# Patient Record
Sex: Female | Born: 1960 | Race: Black or African American | Hispanic: No | Marital: Single | Smoking: Never smoker
Health system: Southern US, Community
[De-identification: ages and names within clinical notes are randomized; demographics above are authoritative.]

## PROBLEM LIST (undated history)

## (undated) DIAGNOSIS — T7840XA Allergy, unspecified, initial encounter: Secondary | ICD-10-CM

## (undated) DIAGNOSIS — F32A Depression, unspecified: Secondary | ICD-10-CM

## (undated) DIAGNOSIS — I10 Essential (primary) hypertension: Secondary | ICD-10-CM

## (undated) DIAGNOSIS — F419 Anxiety disorder, unspecified: Secondary | ICD-10-CM

## (undated) DIAGNOSIS — E119 Type 2 diabetes mellitus without complications: Secondary | ICD-10-CM

## (undated) DIAGNOSIS — E785 Hyperlipidemia, unspecified: Secondary | ICD-10-CM

## (undated) DIAGNOSIS — M1990 Unspecified osteoarthritis, unspecified site: Secondary | ICD-10-CM

## (undated) DIAGNOSIS — F329 Major depressive disorder, single episode, unspecified: Secondary | ICD-10-CM

## (undated) HISTORY — PX: CHOLECYSTECTOMY: SHX55

## (undated) HISTORY — DX: Type 2 diabetes mellitus without complications: E11.9

## (undated) HISTORY — DX: Hyperlipidemia, unspecified: E78.5

## (undated) HISTORY — PX: OTHER SURGICAL HISTORY: SHX169

## (undated) HISTORY — DX: Anxiety disorder, unspecified: F41.9

## (undated) HISTORY — DX: Depression, unspecified: F32.A

## (undated) HISTORY — DX: Essential (primary) hypertension: I10

## (undated) HISTORY — DX: Allergy, unspecified, initial encounter: T78.40XA

## (undated) HISTORY — DX: Unspecified osteoarthritis, unspecified site: M19.90

## (undated) HISTORY — DX: Morbid (severe) obesity due to excess calories: E66.01

## (undated) HISTORY — DX: Major depressive disorder, single episode, unspecified: F32.9

---

## 2000-07-26 ENCOUNTER — Encounter: Admission: RE | Admit: 2000-07-26 | Discharge: 2000-07-26 | Payer: Self-pay | Admitting: Family Medicine

## 2000-07-26 ENCOUNTER — Encounter: Payer: Self-pay | Admitting: Family Medicine

## 2001-10-18 ENCOUNTER — Encounter: Payer: Self-pay | Admitting: Internal Medicine

## 2001-10-18 ENCOUNTER — Ambulatory Visit (HOSPITAL_COMMUNITY): Admission: RE | Admit: 2001-10-18 | Discharge: 2001-10-18 | Payer: Self-pay | Admitting: Internal Medicine

## 2001-10-22 ENCOUNTER — Ambulatory Visit (HOSPITAL_COMMUNITY): Admission: RE | Admit: 2001-10-22 | Discharge: 2001-10-22 | Payer: Self-pay | Admitting: Family Medicine

## 2009-05-20 ENCOUNTER — Ambulatory Visit: Payer: Self-pay | Admitting: Family Medicine

## 2009-05-20 ENCOUNTER — Encounter: Payer: Self-pay | Admitting: Family Medicine

## 2009-05-20 DIAGNOSIS — I10 Essential (primary) hypertension: Secondary | ICD-10-CM | POA: Insufficient documentation

## 2009-05-20 DIAGNOSIS — K5909 Other constipation: Secondary | ICD-10-CM | POA: Insufficient documentation

## 2009-05-20 DIAGNOSIS — J309 Allergic rhinitis, unspecified: Secondary | ICD-10-CM | POA: Insufficient documentation

## 2009-05-20 DIAGNOSIS — F341 Dysthymic disorder: Secondary | ICD-10-CM | POA: Insufficient documentation

## 2009-05-20 DIAGNOSIS — M170 Bilateral primary osteoarthritis of knee: Secondary | ICD-10-CM

## 2009-05-20 LAB — CONVERTED CEMR LAB
ALT: 10 units/L (ref 0–35)
AST: 14 units/L (ref 0–37)
Albumin: 4.1 g/dL (ref 3.5–5.2)
Alkaline Phosphatase: 107 units/L (ref 39–117)
BUN: 11 mg/dL (ref 6–23)
CO2: 24 meq/L (ref 19–32)
Calcium: 9.9 mg/dL (ref 8.4–10.5)
Chloride: 103 meq/L (ref 96–112)
Cholesterol: 177 mg/dL (ref 0–200)
Creatinine, Ser: 0.94 mg/dL (ref 0.40–1.20)
Glucose, Bld: 90 mg/dL (ref 70–99)
HDL: 58 mg/dL (ref >39)
LDL Cholesterol: 107 mg/dL — ABNORMAL HIGH (ref 0–99)
Potassium: 3.9 meq/L (ref 3.5–5.3)
Sodium: 137 meq/L (ref 135–145)
Total Bilirubin: 0.5 mg/dL (ref 0.3–1.2)
Total CHOL/HDL Ratio: 3.1
Total Protein: 8.7 g/dL — ABNORMAL HIGH (ref 6.0–8.3)
Triglycerides: 59 mg/dL (ref <150)
VLDL: 12 mg/dL (ref 0–40)

## 2009-05-24 ENCOUNTER — Ambulatory Visit: Payer: Self-pay | Admitting: Family Medicine

## 2009-06-25 ENCOUNTER — Ambulatory Visit: Payer: Self-pay | Admitting: Family Medicine

## 2009-06-25 ENCOUNTER — Encounter: Payer: Self-pay | Admitting: Family Medicine

## 2009-06-25 LAB — CONVERTED CEMR LAB: Pap Smear: NEGATIVE

## 2009-06-29 ENCOUNTER — Telehealth: Payer: Self-pay | Admitting: Psychology

## 2009-07-06 ENCOUNTER — Encounter: Payer: Self-pay | Admitting: Family Medicine

## 2009-07-06 LAB — CONVERTED CEMR LAB
BUN: 15 mg/dL (ref 6–23)
CO2: 24 meq/L (ref 19–32)
Calcium: 9.3 mg/dL (ref 8.4–10.5)
Chloride: 99 meq/L (ref 96–112)
Creatinine, Ser: 1.05 mg/dL (ref 0.40–1.20)
Glucose, Bld: 96 mg/dL (ref 70–99)
Potassium: 3.3 meq/L — ABNORMAL LOW (ref 3.5–5.3)
Sodium: 137 meq/L (ref 135–145)

## 2009-07-15 ENCOUNTER — Ambulatory Visit: Payer: Self-pay | Admitting: Psychology

## 2009-07-15 DIAGNOSIS — F39 Unspecified mood [affective] disorder: Secondary | ICD-10-CM | POA: Insufficient documentation

## 2009-08-02 ENCOUNTER — Ambulatory Visit: Payer: Self-pay | Admitting: Psychology

## 2009-08-19 ENCOUNTER — Ambulatory Visit: Payer: Self-pay | Admitting: Psychology

## 2009-09-09 ENCOUNTER — Ambulatory Visit: Payer: Self-pay | Admitting: Psychology

## 2009-10-04 ENCOUNTER — Ambulatory Visit: Payer: Self-pay | Admitting: Family Medicine

## 2009-10-04 ENCOUNTER — Encounter: Payer: Self-pay | Admitting: Family Medicine

## 2009-10-04 ENCOUNTER — Ambulatory Visit: Payer: Self-pay | Admitting: Psychology

## 2009-10-04 DIAGNOSIS — E876 Hypokalemia: Secondary | ICD-10-CM

## 2009-10-04 LAB — CONVERTED CEMR LAB
BUN: 10 mg/dL (ref 6–23)
CO2: 23 meq/L (ref 19–32)
Calcium: 9.4 mg/dL (ref 8.4–10.5)
Chloride: 101 meq/L (ref 96–112)
Creatinine, Ser: 0.93 mg/dL (ref 0.40–1.20)
Glucose, Bld: 104 mg/dL — ABNORMAL HIGH (ref 70–99)
Potassium: 3.8 meq/L (ref 3.5–5.3)
Sodium: 138 meq/L (ref 135–145)

## 2010-03-11 ENCOUNTER — Telehealth: Payer: Self-pay | Admitting: Family Medicine

## 2010-03-31 ENCOUNTER — Telehealth (INDEPENDENT_AMBULATORY_CARE_PROVIDER_SITE_OTHER): Payer: Self-pay | Admitting: *Deleted

## 2010-03-31 ENCOUNTER — Encounter: Payer: Self-pay | Admitting: Family Medicine

## 2010-03-31 ENCOUNTER — Ambulatory Visit: Payer: Self-pay | Admitting: Family Medicine

## 2010-03-31 DIAGNOSIS — R443 Hallucinations, unspecified: Secondary | ICD-10-CM

## 2010-04-11 ENCOUNTER — Ambulatory Visit: Payer: Self-pay | Admitting: Family Medicine

## 2010-04-12 ENCOUNTER — Telehealth: Payer: Self-pay | Admitting: Psychology

## 2010-04-22 ENCOUNTER — Ambulatory Visit (HOSPITAL_COMMUNITY)
Admission: RE | Admit: 2010-04-22 | Discharge: 2010-04-22 | Payer: Self-pay | Source: Home / Self Care | Attending: Family Medicine | Admitting: Family Medicine

## 2010-04-28 ENCOUNTER — Ambulatory Visit: Admit: 2010-04-28 | Payer: Self-pay | Admitting: Psychology

## 2010-05-15 ENCOUNTER — Encounter: Payer: Self-pay | Admitting: *Deleted

## 2010-05-26 NOTE — Assessment & Plan Note (Signed)
Summary: F/U  KH   Vital Signs:  Patient profile:   50 year old female Height:      63 inches Weight:      350 pounds BMI:     62.22 Temp:     98.3 degrees F oral Pulse rate:   104 / minute BP sitting:   152 / 94  (left arm) Cuff size:   thigh  Vitals Entered By: Garen Grams LPN (April 11, 2010 1:57 PM) CC: f/u anxiety, bp Is Patient Diabetic? No Pain Assessment Patient in pain? no        Primary Care Provider:  Hulen Luster  CC:  f/u anxiety and bp.  History of Present Illness: pt returns for for f/u of anxiety and hearing voices.  she thinks her anxiety is returning to its normal levels after stopping the HCTZ.  she still hears voices that say "you're going to die".  they do not ask her to hurt herself or others.  she does not want to take pills and she wants to be treated with something natural.  she brought up Kava and passionfruit as possibilities.  She does not want to take any medicaiton for her BP at this time because she is afraid it will her anxiety.  Pt has had lifelong anxiety but has not heard voices before.    Habits & Providers  Alcohol-Tobacco-Diet     Alcohol drinks/day: 0     Tobacco Status: never     Diet Comments: high in fat and salt   Current Medications (verified): 1)  Zyrtec Hives Relief 10 Mg Tabs (Cetirizine Hcl) 2)  Excedrin Tension Headache 500-65 Mg Tabs (Acetaminophen-Caffeine)  Allergies (verified): No Known Drug Allergies  Past History:  Social History: Last updated: 05/20/2009 lives with mother Jamie Salazar.  Does not endorse a healthy diet.  completed 9-12 years of education.  never married, heterosexual, has a car, no alcohol, no drugs, no exercise.    Review of Systems  The patient denies anorexia, weight loss, syncope, and abdominal pain.    Physical Exam  General:  NAD, vs reviewed and rechecked Head:  normocephalic and atraumatic.   Eyes:  vision grossly intact.   Ears:  R ear normal and L ear normal.   Lungs:  Normal  respiratory effort, chest expands symmetrically. Lungs are clear to auscultation, no crackles or wheezes. Heart:  Normal rate and regular rhythm. S1 and S2 normal without gallop, murmur, click, rub or other extra sounds. Abdomen:  morbidly obese, nontender Psych:  Oriented X3, memory intact for recent and remote, normally interactive, good eye contact, not anxious appearing, and not depressed appearing.     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  discussed with Dr. Pascal Lux.  She suggests that pt follow up with her and Dr. Kathrynn Running.  Pt has been given number to call.  these voices seem benign but pt given RTC to call and return. Orders: [facility]- Est  Level 4 ([id number])  Problem # 2:  ESSENTIAL HYPERTENSION (ICD-401.9) high but will not give any new meds until anxiety improved.  pt likely to emphasize any side effect at this point.  she also is afraid to swallow any pills Orders: [facility]- Est  Level 4 ([id number])  Problem # 3:  DEPRESSION/ANXIETY (ICD-300.4) Assessment: Unchanged pt wants to treat with a natural remedy.  advised her not to use Kava.  looked up passionflower which is likely safe according to  medline review.  pt to RTC after her meeting with Dr. Pascal Lux. Orders: [facility]- Est  Level 4 ([id number])  Complete Medication List: 1)  Zyrtec Hives Relief 10 Mg Tabs (Cetirizine hcl) 2)  Excedrin Tension Headache 500-65 Mg Tabs (Acetaminophen-caffeine)  Patient Instructions: 1)  Please do not take Kava 2)  If you would like to start a depression/anxiety medication, please come and talk to me or Dr. Pascal Lux.   3)  Call or return if your voices start to seem threatening or are telling you to hurt yourself or others. 4)  Kava is POSSIBLY UNSAFE when taken by mouth. Don't use it. Serious illness, including liver damage, has occurred even with short-term use of normal doses. The use of kava for as little as one to three  months has resulted in the need for liver transplants, and even death. Early symptoms of liver damage include yellowed eyes and skin (jaundice), fatigue, and dark urine. If you decide to take kava, despite warnings to the contrary, be sure to get frequent liver function tests. 5)  Using kava can make you unable to drive or operate machinery safely. Do not take kava before you plan on driving. "Driving-under-the-influence" citations have been issued to people driving erratically after drinking large amounts of kava tea.    Orders Added: 1)  [facility]- Est  Level 4 [[id number]]

## 2010-05-26 NOTE — Assessment & Plan Note (Signed)
Summary: Initial behavioral Medicine   Primary Care Provider:  Hulen Luster   History of Present Illness: "Jamie Salazar" presented for an initial psychological assessment.  A client information sheet detailing the Behavioral Medicine Service was provided.  The patient voiced an understanding of what was detailed on this sheet including the issue of confidentiality and the limits thereof.  Presenting problem: Jamie Salazar reported that she is depressed and has been the "worrying type" ever since childhood.  Her main symptoms is tearfullness at unexpected times and she reports feeling mentally tired.  She says the weather affects her mood significantly such that she is energetic and motivated when it is sunny and when it is cloudy she wants to sleep the day away.  She thinks her depression stems from the sacrifices she made as the oldest child of a single mom who was sick with Lupus.  She focused on taking care of everyone else and let herself go int he process.  She would like to focus on herself now.  Relevant medical history: Morbid obesity.  Weight at initial presentation was 360.  This proved encouraging to her because she thought she was heavier.  She lost 15 pounds and has an eventual goal of weighing 200 pounds.  She has a good sense of how to get there (portion control, regular meals, eating for the right reasons, not drinking her calories and eventually - exercise).  She has arthritis which limits her ability to exercise.  She recognizes the connection between her blood pressure and weight.  See med list.  She is not taking the Celexa because she cannot afford the $4 per month.  She also states that she is not a "pill person."  Psych history: Was seen for therapy at [facility] for about four months with termination in February, 2011 due to feelign stuck.  Denied other treatment other than an [facility] physician prescribing Zoloft a while back.  She never took it.   Family history: Oldest of four children.   Siblings have different paternity.  Lives with mother in mother's house.  Mom remains very ill with Lupus.  Father is deceased.  Did not have a close relationship with him.  Brother died of lupus.  She has two sisters and she is close to them.  The youngest has lost nearly 250 pounds without surgical intervention.  History of abuse: Did not assess.  Education / Occupation: 11th grade.  Last full-time work was 2001.  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  Can not stand for eight hours like she used to.  Not on disability.    Substances use:  Denies tobacco, alcohol and other drugs.    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  She maintains good eye contact and iscooperative and attentive.  Speech is normal in tone, rate and rhythm.  Mood is reported as depressed and anxious but variable dependent on the weather.  Affect varies.  She is tearful on occasion but able to smile and laugh.  Brightens considerably when talking about her weight loss.  Thought process is logical and goal directed.  Denied suicidal or homicidal ideation.  Does not appear to be responding to any internal stimuli.  Judgment and insight are average.  Reluctant to label this Major Depression.  She goes a night without sleeping on a regular basis and reports feeling restless.  She reports shifts  in energy and mood.  She feels driven at times to do certain activities (cook, shop).  Reports some anhedonia but clearly experiences pleasure with sunnier weather.  Enjoys going for rides in the car.  Finances limit her ability to do what she wants but there is a decreased interest in previously pleasureable activities.  She is able to report things that might help her mood including exercise, eating well, thinking about others.  I added keeping a regular sleep schedule.    PHQ-9 score  was 9 with function indicated as "not difficult at all."  The symptoms she endorsed nearly every day was difficulty with concentration.  MDQ was 3 positive responses including less sleep, talking faster and racing thoughts.  Again she indicated that these symptoms were not a problem.  Her report on these measures indicates a lack of functional impairment.  Will clarify this next visit when I give her feedback on these measures.  Asked her to consider an endpoint - how will she know therapy is working?  What will be different.    Follow-up on:  April 11th at 9:00.  [phone]  Complete Medication List: 1)  Hydrochlorothiazide 25 Mg Tabs (Hydrochlorothiazide) .... Take one daily 2)  Celexa 20 Mg Tabs (Citalopram hydrobromide) .... Take one daily 3)  Klor-con 10 10 Meq Cr-tabs (Potassium chloride) .... Take 2 tabs daily 4)  Metronidazole 500 Mg Tabs (Metronidazole) .... Take 4 tabs one time by mouth

## 2010-05-26 NOTE — Progress Notes (Signed)
Summary: Schedule beh med  Phone Note Call from Patient   Caller: Patient Call For: Spero Geralds Summary of Call: Patient seemed ambivalent about coming in to see me.  She thinks she needs a medication but then says she has always been concerned about taking a medication.  Offered an appt to explore ambivalence. She agreed.  Will meet January 5th at 11:00. Initial call taken by: Spero Geralds PsyD,  April 12, 2010 10:53 AM

## 2010-05-26 NOTE — Assessment & Plan Note (Signed)
Summary: Behavioral Medicine Follow-up   Primary Care Provider:  Hulen Luster   History of Present Illness: Continued to process the loss of her sister.  While receiving visitors, Alinda Money noticed the discomfort some of them were experiencing (around the grief) and worked hard to make them feel more comfortable.  We talked about this in the context of her upbringing and how she has always been the caretaker.  See assessment for more details.  Her relationship with her mother is challenging.  Alinda Money is ready to forgive her but her mother is currently not in a place to connect.  She has been in the past but at the time, Alinda Money wasn't ready.  She does not believe that her mother loves her (or anyone else for that matter) and needs to learn how to love.  Alinda Money acknowledged again today that she has learned to love herself.  Discussed boundaries when it comes to feelings (i.e. who is ultimately responsible for how one feels).  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  Affect is variable and consistent with her speech content.  She cries when discussing the relationship with her mother.  Thoughts are clear and goal directed.  Insight is average to above.  No evidence of SI.  She is working hard in therapy.  Patient continues to report improvement in symptoms and behaviors.  We explored her caretaking around her sister's funeral and determined it to be a healthy blend between taking care of herself and being aware of and sensitive to other people's feelings.  She said previousley, she would have ignored her own experience in service of others.  Progress (as she says).  With regards to her mother, she seems very clear headed about this as well.  There were some psychoeducational points today but for the most part, she is approaching things in what seems like a healthy direction.  She does not try  to caretake me.  Having 50 minutes where the focus is on her, in service of her, is likely to be therapeutic in and of itself.  Will follow near the end of May.  Orders: Therapy 40-50- min- [facility] ([id number])  Problem # 2:  MORBID OBESITY (ICD-278.01)  Patient continues to make healthy choices.  Discussed nutritional counseling - she had meant to follow-up with Dr. Gerilyn Pilgrim and forgot.  She will weigh herself on the way out and is excited about the result.  Discussed not letting a number on a scale determine how she feels.  If it is not as much of a loss as she expects / hopes, the efforts are still helpful in that they are healthier AND she feels healthier when working on her weight in this way.  She did not want me to see her weigh in but she did check back afterward...disappointed.  Reviewed "don't let a number on a scale determine how you will feel today."  Orders: Therapy 40-50- min- [facility] ([id number])  Complete Medication List: 1)  Hydrochlorothiazide 25 Mg Tabs (Hydrochlorothiazide) .... Take one daily 2)  Celexa 20 Mg Tabs (Citalopram hydrobromide) .... Take one daily 3)  Klor-con 10 10 Meq Cr-tabs (Potassium chloride) .... Take 2 tabs daily 4)  Metronidazole 500 Mg Tabs (Metronidazole) .... Take 4 tabs one time by mouth

## 2010-05-26 NOTE — Assessment & Plan Note (Signed)
Summary: Seeing Jamie Salazar @ 9:30 / [facility]   Vital Signs:  Patient profile:   50 year old female Height:      63 inches Weight:      343.6 pounds BMI:     61.09  Vitals Entered By: Wyona Almas PHD (October 04, 2009 9:41 AM)   Primary Care Provider:  Hulen Luster   History of Present Illness: Assessment:  Saw pt for 60 minutes.  Eating pattern is erratic; seldom eats breakfast, and she tends to snack.  Jamie Salazar fav fds include [place] foods, starches.  Foods Jamie Salazar does not like include Brussel sprouts, artichoke, &  broccoli.  Has never tried non-fried fish.  Physical activity is nonexistent, but she said she is going to start walking 3-5 X wk per Dr. Hulen Luster.  24-hr recall suggests kcal intake of  ~1300: B- none ; L (12:30 PM)- Captain D's 4 hush puppies, 1/2 c slaw, 1 c [place] fries, 3 pc fried fish, 16 oz water; Snk (4:30 PM)- 1 c banana pudding.  Nutrition Diagnosis:  Physical inactivity (NB-2.1) related to inertia as evidenced by no reported exercise of any kind.  Inappropriate intake of types of carbohydrate (NI-53.3) related to fiber as evidenced by no fruit, and <1 serving of veg's yesterday.  Poor distribution of kcal as evidenced by only one meal and one snack yesterday.   Intervention: See Patient Instructions.    Monitoring/Eval:  Dietary intake, body weight, and exercise at 4-wk F/U.     Allergies: No Known Drug Allergies   Complete Medication List: 1)  Hydrochlorothiazide 25 Mg Tabs (Hydrochlorothiazide) .... Take one daily 2)  Celexa 20 Mg Tabs (Citalopram hydrobromide) .... Take one daily 3)  Potassium Chloride 20 Meq/28ml (10%) Liqd (Potassium chloride) .... Take each day (3 teaspoons), dispense with one month supply 4)  Metronidazole 500 Mg Tabs (Metronidazole) .... Take 4 tabs one time by mouth  Other Orders: Inital Assessment Each - [facility] [phone])  Patient Instructions: 1)  Walk 3-5 X wk, starting your first week with ONLY 15 min.  In later weeks, increase  no more than 3 minutes per time.   2)  Increase water intake.  Fluids goal:  at least 8 8-oz cups.   3)  Eat at least 3 meals and 1-2 snacks per day.  4)  Breakfast options:  oatmeal, bran cereals,  5)  Aim for a consistent bedtime.  Remember that adequate sleep is essential to good health, and weight management.   6)  Aim for at 5 servings of fruits and veg's.   7)  Obtain twice as many veg's as protein or carbohydrate foods for both lunch and dinner. 8)  Dried beans can count as both protein and starch.   9)  TASTE PREFERENCES ARE LEARNED.   10)  Schedule a follow-up nutrition appt for 4 weeks.

## 2010-05-26 NOTE — Progress Notes (Signed)
Summary: phn msg  Phone Note Call from Patient Call back at [phone] or [phone]   Caller: Patient Summary of Call: she thinks her bp pills is causing anxiety and hearing voices/mind is racing - would like to change meds asap- will not take bp pill today [facility] [address]  wants to know if it's alright to go health food store to get something to help with her anxiety Initial call taken by: De Nurse,  March 31, 2010 8:39 AM    spoke with patient and she has been on HCTZ for several months. just last week she started with anxiety and hearing voices. she read pamphlet with medication and anxiety is a side effect she states. she has not taken medication today and does not want to take it anymore. appointment scheduled today to discuss with MD. Theresia Lo RN  March 31, 2010 10:58 AM

## 2010-05-26 NOTE — Assessment & Plan Note (Signed)
Summary: anxiety, hearing voices, thinks BP med causing/ls   Vital Signs:  Patient profile:   50 year old female Height:      63 inches Weight:      340.5 pounds BMI:     60.53 Temp:     98.8 degrees F oral Pulse rate:   108 / minute BP sitting:   131 / 88  (left arm) Cuff size:   large  Vitals Entered By: Garen Grams LPN (March 31, 2010 3:36 PM) CC: anxiety, hearing voices x 3 weeks Is Patient Diabetic? No Pain Assessment Patient in pain? no        Primary Care Arynn Armand:  Hulen Luster  CC:  anxiety and hearing voices x 3 weeks.  History of Present Illness: 1. Anxiety and Hearing Voices:  Pt with a long standing history of anxiety.  She has frequent anxiety and panic attacks ever since she was a little girl.  She was recently started on HCTZ for her blood pressure and she thinks that it could be causing her anxiety to be worse.  She is afraid to take medicines and is afraid of death.  Since taking the medicine her anxiety has been worse.  She has also heard voices.  She has never heard voices before and this is concerning to her.  The voice is telling her that "you are going to die."  She stopped taking the HCTZ today and she hasn't heard any voices today.  The voices are not telling her to kill herself.  She has no thoughts about suicide.    Habits & Providers  Alcohol-Tobacco-Diet     Alcohol drinks/day: 0     Tobacco Status: never     Diet Comments: high in fat and salt  Current Medications (verified): 1)  None  Allergies: No Known Drug Allergies  Past History:  Past Medical History: Anxiety Mood disorder  Family History: Reviewed history from 05/20/2009 and no changes required. hx of lupus and diabetes in mother and brother.  DM in grandmother.  Social History: Reviewed history from 05/20/2009 and no changes required. lives with mother Elana Jian.  Does not endorse a healthy diet.  completed 9-12 years of education.  never married, heterosexual, has a car, no  alcohol, no drugs, no exercise.    Review of Systems  The patient denies fever, weight loss, vision loss, decreased hearing, hoarseness, chest pain, syncope, dyspnea on exertion, peripheral edema, prolonged cough, and headaches.    Physical Exam  General:  vitals reviewed. obese, tearful at times.  no acute distress.  not manic.  coherent.  AAOX3.   Head:  normocephalic and atraumatic.   Eyes:  vision grossly intact.  PERRL.  EOMI Lungs:  Normal respiratory effort, chest expands symmetrically. Lungs are clear to auscultation, no crackles or wheezes. Heart:  distant heart sounds.  Regular rate. Neurologic:  Intact Psych:  Emotionally labile.  Tearful at times.  Some tangential thinking.  Not acutely manic or pyschotic.  Good eye contact.    not agitated, not suicidal, and judgment fair.     Impression & Recommendations:  Problem # 1:  DEPRESSION/ANXIETY (ICD-300.4) Assessment Deteriorated  Likely related to her fear of medications and having to take prescription medications for her blood pressure.  She didn't want to start anything new for her anxiety.  She would benefit from close follow up.  Would also arrange for her to follow up with Dr. Kathrynn Running and Dr. Pascal Lux.  Discussed case with Dr. Pascal Lux and she  agrees with the plan.  Next available appointment is January 18th.  Will defer to PCP.  Orders: [facility]- Est  Level 4 ([id number])  Problem # 2:  AUDITORY HALLUCINATION (ICD-780.1) Assessment: New  Likely also related to her anxiety.  She is not acutely psychotic.  She denies and SI and demanding voices.  She is safe which is my main concern as a work in.  This doesn't have to be acutely managed and she is very hesitant about starting a new medicine.  Will have her follow up closely with PCP.    Orders: [facility]- Est  Level 4 ([id number])  Problem # 3:  ESSENTIAL HYPERTENSION (ICD-401.9) Assessment: Unchanged  BP acceptable without any medications.  Will stop the HCTZ.  Would be hesitant to restart a  medicine on her since she is so afraid of them. The following medications were removed from the medication list:    Hydrochlorothiazide 25 Mg Tabs (Hydrochlorothiazide) .Marland Kitchen... Take one daily  Orders: [facility]- Est  Level 4 ([id number])  Patient Instructions: 1)  I think that all of your symptoms could be related to your anxiety 2)  I think that the medicine that you were prescribed and your fear of medicine could be playing into that 3)  I would like for you to stop the HCTZ and see how you do 4)  If you start having any thoughts of wanting to hurt / kill yourself, if the voices become more bothersome, if your anxiety gets worse then call the office or call 911 5)  Please schedule a follow up appointment with Dr. Hulen Luster next week and she will follow you until we can get you into the Mental Health Specialist   Orders Added: 1)  [facility]- Est  Level 4 [[id number]]

## 2010-05-26 NOTE — Assessment & Plan Note (Signed)
Summary: Behavioral Medicine Follow-up   Primary Care Provider:  Hulen Luster   History of Present Illness: Jamie Salazar presents stating she almost didn't come because she was having a "good day" and didn't feel the need.  She did have "racing thoughts on her agenda.  She reported that in contrast to today when she is calm and able to focus and relaxed, there are periods of time where she experiences:  periods of "anxiety" where has difficulty focusing (she may be reading and wanting to sing a song at the same time), increased talkativeness and a mind that moves faster than normal.  She notes getting less sleep during these periods of time but she is tired - she just doesn't want to go to sleep and wakes herself up when she nods off.  When asked how many episodes she has had in the last two weeks - she said none.  In the last month - none.  She denies impairment of any kind but reports she is "tired of the agitation."  Continues to grieve loss of sister.  Continues to work on weight loss.  Did not call Dr. Gerilyn Pilgrim yet but plans to get her number upon leaving today.  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  Affect is variable and appropriate to her speech content - she is tearful when talking about her sister.  Thoughts are clear and goal directed.  Not suicidal now or during episodes she describes today.  MDQ dated 07/15/09 was negative.  Three yes responses on this measure are consistent with her report of symptoms today.  Provided psychoeducation about impairment.  It sounds like she has a normal range of emotion and when out of sorts, is able to maintain function.  Used blood pressure range as an analogy - don't want her to get too high or too low but fluctuation within a normal range is expected and desireable.  Asked her to do mood charting (forms provided) as much as to educate /  normalize her experience for herself as for me.  Perhaps there is something more here but she is not reporting it either verbally or via self-report measure.  She does reiterate that she has done significant work on her own prior to presenting for therapy and that her mood and anxiety used to be significantly worse.  Will follow in one month or as needed.  I do want to support her weight loss efforts and as I mentioned earlier, this time is a time where the focus is on her and that is likely a good thing.   Orders: Therapy 40-50- min- [facility] (04540)  Complete Medication List: 1)  Hydrochlorothiazide 25 Mg Tabs (Hydrochlorothiazide) .... Take one daily 2)  Celexa 20 Mg Tabs (Citalopram hydrobromide) .... Take one daily 3)  Klor-con 10 10 Meq Cr-tabs (Potassium chloride) .... Take 2 tabs daily 4)  Metronidazole 500 Mg Tabs (Metronidazole) .... Take 4 tabs one time by mouth

## 2010-05-26 NOTE — Assessment & Plan Note (Signed)
Summary: NP,tcb   Vital Signs:  Patient profile:   50 year old female Height:      63 inches Weight:      360 pounds BMI:     64.00 BSA:     2.48 Temp:     98.4 degrees F Pulse rate:   111 / minute BP sitting:   185 / 125  Vitals Entered By: Jone Baseman CMA (May 20, 2009 2:13 PM)  Serial Vital Signs/Assessments:  Time      Position  BP       Pulse  Resp  Temp     By                     160/110                        Ellery Plunk MD  Comments: 2:14 PM Manual BP:160/110 By: Jone Baseman CMA   CC: NP Is Patient Diabetic? No Pain Assessment Patient in pain? no        Primary Care Provider:  Hulen Luster  CC:  NP.  History of Present Illness: Today Pt presents with high blood pressure and depression complaints.  These complaints were prioritized ahead of a complete physical.  Will see pt back for that.    HTN- has been told in the past that her BP is high at MD office, but has never been on medication.  Pt reports fear of medication.  She also reports having to chew her medications as she cannot swallow pills.  Reports that her diet is heavy in salt and fat and that she is an emotional eater because "food doesn't judge".  She is interested in changing her diet and modifying lifestyle to achieve weight loss and BP reduction.  Denies HA, blurry vision.  Depression- has "spells" which last several months.  She has never been on medication for this.  She sees a therapist at [facility].  She feels that depression is a constant for her and she will never recover, but she would like to manage it better.   SHe says that anxiety is a big part of her depression.  However she also describes periods where she needs less sleep and that her body is moving and she can't keep up.  She says that her mood is changible like the weather.    Habits & Providers  Alcohol-Tobacco-Diet     Alcohol drinks/day: 0     Tobacco Status: never     Diet Comments: high in fat and  salt  Exercise-Depression-Behavior     Does Patient Exercise: no     Exercise Counseling: to improve exercise regimen     Type of exercise: walking  Current Medications (verified): 1)  Hydrochlorothiazide 25 Mg Tabs (Hydrochlorothiazide) .... Take One Daily  Allergies (verified): No Known Drug Allergies  Family History: hx of lupus and diabetes in mother and brother.  DM in grandmother.  Social History: Smoking Status:  never Does Patient Exercise:  no  Review of Systems  The patient denies anorexia, fever, weight loss, hoarseness, chest pain, abdominal pain, and severe indigestion/heartburn.    Physical Exam  General:  morbidly obese.  cooperative to examination.   Head:  normocephalic and atraumatic.   Eyes:  pupils equal, pupils round, and pupils reactive to light.   Ears:  R ear normal and L ear normal.   Nose:  no external deformity and no external erythema.   Mouth:  poor dentition with missing teeth Neck:  No deformities, masses, or tenderness noted. Lungs:  Normal respiratory effort, chest expands symmetrically. Lungs are clear to auscultation, no crackles or wheezes. Heart:  Normal rate and regular rhythm. S1 and S2 normal without gallop, murmur, click, rub or other extra sounds. Abdomen:  Bowel sounds positive,abdomen soft and non-tender without masses, organomegaly or hernias noted.  exam limited by obesity Genitalia:  deferred Msk:  normal ROM.   Extremities:  dry scaly skin.  no edema Neurologic:  alert & oriented X3, cranial nerves II-XII intact, strength normal in all extremities, and gait normal.   Skin:  dry skin Psych:  Oriented X3, memory intact for recent and remote, normally interactive, and good eye contact.  occasionally tearful.   Impression & Recommendations:  Problem # 1:  ESSENTIAL HYPERTENSION (ICD-401.9) Assessment New Pt will return for nurse visit on Monday to recheck BP.  Asked pt to take medication if over 140/90, however discussed with  pt that it is her choice to take medication.  Also, gave her a 10page handout on the DASH diet.  Explained that this can be as effective as medication and will also help with weight loss.  Explained that the main focus is addition of lots of fruits and veggies to diet.  Pt in aggreement to read about this and think about implementation.  Also, pt agrees to try to be active every day.  Plan to see pt back for CPE with PAP as well as for HTN f/u Her updated medication list for this problem includes:    Hydrochlorothiazide 25 Mg Tabs (Hydrochlorothiazide) .Marland Kitchen... Take one daily  Orders: Comp Met-[facility] [id number]) Lipid-[facility] (13086-57846)  Problem # 2:  DEPRESSION/ANXIETY (ICD-300.4) Assessment: Unchanged long term mood disturbance.  Currently in counselling.  Will ask Dr. Pascal Lux if this is someone she would consider seeing in mood disorder clinic.  Not sure Pt would be willing to take meds if prescribed.  She asked about natural options.  Also, pt does endorse some periods of high energy.  I will try to figure out if this is hypomania in a future visit.    Problem # 3:  MORBID OBESITY (ICD-278.01) Assessment: New discussed various options with pt.  gave info on DASH diet.  discussed exercise.  will f/u in one month.  Complete Medication List: 1)  Hydrochlorothiazide 25 Mg Tabs (Hydrochlorothiazide) .... Take one daily  Patient Instructions: 1)  Please come back for a nurse visit on Monday for a blood pressure check. 2)  Please come back to see me in one month. 3)  It was nice to meet you today. 4)  We discussed your blood pressure and your general health today. 5)  I would like you to try to move your body every day. 6)  Also, please read about the DASH diet, this can help lower blood pressure without a medication. 7)  I will send you home with a prescription for a blood pressure medication today.  Please fill it if your blood pressure is over 140/90 at your nurse  visit. Prescriptions: HYDROCHLOROTHIAZIDE 25 MG TABS (HYDROCHLOROTHIAZIDE) take one daily  #30 x 3   Entered and Authorized by:   Ellery Plunk MD   Signed by:   Ellery Plunk MD on 05/20/2009   Method used:   Print then Give to Patient   RxID:   [id number]

## 2010-05-26 NOTE — Miscellaneous (Signed)
  Clinical Lists Changes  Problems: Added new problem of OBESITY, UNSPECIFIED (ICD-278.00) Added new problem of ARTHRITIS (ICD-716.90) Added new problem of CONSTIPATION, CHRONIC (ICD-564.09) Added new problem of ALLERGIC RHINITIS (ICD-477.9) Added new problem of DEPRESSION/ANXIETY (ICD-300.4) Observations: Added new observation of FAMILY HX: hx of lupus and diabetes (05/20/2009 13:40) Added new observation of SOCIAL HX: lives with mother Jazzmine Kleiman.  Does not endorse a healthy diet.  completed 9-12 years of education.  never married, heterosexual, has a car, no alcohol, no drugs, no exercise.   (05/20/2009 13:40) Added new observation of PAST SURG HX: cholecystectomy (05/20/2009 13:40)       Family History: hx of lupus and diabetes  Social History: lives with mother Jeanice Dempsey.  Does not endorse a healthy diet.  completed 9-12 years of education.  never married, heterosexual, has a car, no alcohol, no drugs, no exercise.     Past History:  Past Surgical History: cholecystectomy

## 2010-05-26 NOTE — Progress Notes (Signed)
Summary: Schedule beh-med  Phone Note Call from Patient   Caller: Patient Call For: Spero Geralds, Psy.D. Summary of Call: Patient left a VM to schedule an appt.  I called back and left a VM. Initial call taken by: Spero Geralds PsyD,  June 29, 2009 2:03 PM  Follow-up for Phone Call        We connected by phone and scheduled an Initial Beh-med appt for:March 24th at 2:00. Follow-up by: Spero Geralds PsyD,  June 29, 2009 3:26 PM

## 2010-05-26 NOTE — Assessment & Plan Note (Signed)
Summary: Behavioral Medicine Follow-up   Primary Care Provider:  Hulen Luster   History of Present Illness: Jamie Salazar reported that her middle sister (age 51) died unexpectedly a week ago 09/27/22.  She had the memorial this past 09/27/22 (two days ago).  This is the sister that was so inspirational with her weight loss.  Jamie Salazar talked about having to be the "rock" for her family and the energy drain that this is causing.  Discussed coping.  Allergies: No Known Drug Allergies   Impression & Recommendations:  Problem # 1:  UNSPECIFIED EPISODIC MOOD DISORDER (ICD-296.90)  Affect tearful but appropriate to the situation.  Thoughts are clear and goal directed and not overly negative.  She sounds hopeful for the future - especially for bettering her own health.  Thoughts about having to be the "rock" for her family are complex - feels burdened by them and yet they likely servie a purpose for her as well.  No evidence of SI.  Emotions are suprisingly in check for such a loss.  Patient's religious background seems to bring her much comfort and she endorsed this.  Will follow as soon as possible (two weeks) to reassess mood and follow-up on self-report measures / assessment (something I had planned to do today prior to learning about her sister's death).  Orders: Therapy 40-50- min- [facility] ([address])  Complete Medication List: 1)  Hydrochlorothiazide 25 Mg Tabs (Hydrochlorothiazide) .... Take one daily 2)  Celexa 20 Mg Tabs (Citalopram hydrobromide) .... Take one daily 3)  Klor-con 10 10 Meq Cr-tabs (Potassium chloride) .... Take 2 tabs daily 4)  Metronidazole 500 Mg Tabs (Metronidazole) .... Take 4 tabs one time by mouth

## 2010-05-26 NOTE — Assessment & Plan Note (Signed)
Summary: f/u eo   Vital Signs:  Patient profile:   51 year old female Height:      63 inches Weight:      345 pounds BMI:     61.33 BSA:     2.44 Temp:     98.6 degrees F Pulse rate:   98 / minute BP sitting:   194 / 104  Vitals Entered By: Jone Baseman CMA (June 25, 2009 1:39 PM)  Serial Vital Signs/Assessments:  Time      Position  BP       Pulse  Resp  Temp     By                     140/90                         Ellery Plunk MD  CC: HTN and PAP Is Patient Diabetic? No Pain Assessment Patient in pain? no        Primary Care Provider:  Hulen Luster  CC:  HTN and PAP.  History of Present Illness: Pt presents for f/u of BP and for PAP.  HTN- initial BP was high, but pt was excited about weight loss.  Has lost 15 lbs since last visit.  Eating smaller sized portions  Depression- pt notes loss of appetite, crying with little provocation, has trouble getting going.  would like to try a medication today.  Also notes that her therapist isn't helping and she owuld like to try another one.  GYN- last sexual encounter 1 year ago.  Pt would now like to not have any more sex until marriage.  no current partner, no high risk activity, has periods every 1-3 months that are light.  Denies dc or other symptoms  Habits & Providers  Alcohol-Tobacco-Diet     Tobacco Status: never  Current Medications (verified): 1)  Hydrochlorothiazide 25 Mg Tabs (Hydrochlorothiazide) .... Take One Daily 2)  Celexa 20 Mg Tabs (Citalopram Hydrobromide) .... Take One Daily  Allergies (verified): No Known Drug Allergies  Review of Systems       The patient complains of anorexia and weight loss.  The patient denies chest pain, syncope, dyspnea on exertion, and abdominal pain.    Physical Exam  General:  obese, excited, talkative Head:  normocephalic, atraumatic, and no abnormalities observed.   Lungs:  Normal respiratory effort, chest expands symmetrically. Lungs are clear to auscultation,  no crackles or wheezes. Heart:  Normal rate and regular rhythm. S1 and S2 normal without gallop, murmur, click, rub or other extra sounds. Abdomen:  Bowel sounds positive,abdomen soft and non-tender without masses, organomegaly or hernias noted. obese Genitalia:  Pelvic Exam:        External: normal female genitalia without lesions or masses        Vagina: normal without lesions or masses        Cervix: normal without lesions or masses        Adnexa: exam limited by patient size        Uterus: exam limited by patient size        Pap smear: performed Pulses:  R radial normal, R posterior tibial normal, L radial normal, and L posterior tibial normal.   Extremities:  trace left pedal edema and trace right pedal edema.   Psych:  Oriented X3, memory intact for recent and remote, normally interactive, and good eye contact.  Not currently tearful or withdrawn.  Marland Kitchen  fpcpapord  Impression & Recommendations:  Problem # 1:  GYNECOLOGICAL EXAMINATIONOUTINE (ICD-V72.31) Assessment Unchanged checked pap today.  No high risk sexual activity.  Pt close to menopause.  Problem # 2:  ESSENTIAL HYPERTENSION (ICD-401.9) Assessment: Improved BP at goal of 140/90 on recheck.  WIll check BMET today as started HCTZ and is doing well on it.  Notes increased urination. Her updated medication list for this problem includes:    Hydrochlorothiazide 25 Mg Tabs (Hydrochlorothiazide) .Marland Kitchen... Take one daily  Orders: Basic Met-[facility] ([address]403-47425) [facility]- Est  Level 4 (95638)  Problem # 3:  DEPRESSION/ANXIETY (ICD-300.4) Assessment: Deteriorated  Pt would now like to see a new psychologist or therapist and would like to start a medication.  Started Celexa.  Pt has been hesitant to try medicaitons in the past.  Gave her Dr. Carola Rhine number today to call.    Orders: [facility]- Est  Level 4 ([address]643)  Problem # 4:  MORBID OBESITY (ICD-278.01) Assessment: Improved  Pt has lost 15 lbs.  Pt to continue to watch portion sizes and eat  more fruits and veggies.  Consider sending to Dr. Gerilyn Pilgrim at next visit if pt interested.  Orders: [facility]- Est  Level 4 ([address])  Complete Medication List: 1)  Hydrochlorothiazide 25 Mg Tabs (Hydrochlorothiazide) .... Take one daily 2)  Celexa 20 Mg Tabs (Citalopram hydrobromide) .... Take one daily  Other Orders: Pap Smear-[facility] ([id number])  Patient Instructions: 1)  Today your weight was 345!  Great Job.  Keep up the good work with portion sizes and eating more fruit and veggies. 2)  I have given you a card for Dr. Pascal Lux, our psychologist.  Call her to set up an appt.  [phone]. 3)  I have also prescribed celexa for you to take.  I have started you on the smallest dose. Prescriptions: CELEXA 20 MG TABS (CITALOPRAM HYDROBROMIDE) take one daily  #30 x 3   Entered and Authorized by:   Ellery Plunk MD   Signed by:   Ellery Plunk MD on 06/25/2009   Method used:   Faxed to ...       [facility] [address] (retail)       [address]       [facility]       [place], [place]  [address]       Ph: [phone]       Fax: [phone]   RxID:   [id number] CELEXA 20 MG TABS (CITALOPRAM HYDROBROMIDE) take one daily  #30 x 3   Entered and Authorized by:   Ellery Plunk MD   Signed by:   Ellery Plunk MD on 06/25/2009   Method used:   Print then Give to Patient   RxID:   [id number]   Prevention & Chronic Care Immunizations   Influenza vaccine: Not documented    Tetanus booster: Not documented    Pneumococcal vaccine: Not documented  Colorectal Screening   Hemoccult: Not documented    Colonoscopy: Not documented  Other Screening   Pap smear: Not documented    Mammogram: Not documented   Smoking status: never  (06/25/2009)  Lipids   Total Cholesterol: 177  (05/20/2009)   LDL: 107  (05/20/2009)   LDL Direct: Not documented   HDL: 58  (05/20/2009)   Triglycerides: 59  (05/20/2009)  Hypertension   Last Blood Pressure: 194 / 104  (06/25/2009)    Serum creatinine: 0.94  (05/20/2009)   Serum potassium 3.9  (05/20/2009)  Hypertension flowsheet reviewed?: Yes   Progress toward BP goal: At goal  Self-Management Support :   Personal Goals (by the next clinic visit) :      Personal blood pressure goal: 140/90  (06/25/2009)   Patient will work on the following items until the next clinic visit to reach self-care goals:     Medications and monitoring: take my medicines every day, check my blood pressure, bring all of my medications to every visit  (06/25/2009)     Eating: eat more vegetables, use fresh or frozen vegetables, eat foods that are low in salt  (06/25/2009)    Hypertension self-management support: Written self-care plan  (06/25/2009)   Hypertension self-care plan printed.

## 2010-05-26 NOTE — Letter (Signed)
Summary: Results Follow-up Letter  [facility]  [address]   [place], [place] [address]   Phone: [phone]  Fax: [phone]    07/06/2009  [address] [place], [place]  [address]  Dear Ms. Jamie Salazar,   The following are the results of your recent test(s):  Test     Result     Pap Smear    Normal____x___  Not Normal_____       Comments:The actual pap was normal.  There was no evidence of cancer.  However, It showed an infection called Trichomonas.  I have sent a prescription for an antibiotic to your pharmacy.    _________________________________________________________ Other Tests: Potassium:This was low on your blood test.  Please go to your pharmacy to pick up a prescription for a potassium supplement and come back in one month to get this level checked.  _________________________________________________________   Sincerely,  Ellery Plunk MD [facility]           Appended Document: Results Follow-up Letter mailed.

## 2010-05-26 NOTE — Assessment & Plan Note (Signed)
Summary: Behavioral Medicine follow-up   Primary Care Provider:  Hulen Luster   History of Present Illness: Jamie Salazar presented having already seen her PCP and Dr. Gerilyn Pilgrim for nutrition management.  She reported she was having another "good day" and didn't really  need to be here.  I asked about her mood charting.  She had not done it.  We looked at what got in the way and we revisited the purpose (to get a better sense of her mood issues or lack thereof).  Again she reports no significant mood disturbance since the last time we met.  She talks about her heart "swelling" when she thinks about her sister.  Discussed the concept of happiness and the factors that typically influence one's sense of it.  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  Affect is bright.  Thoughts are clear and goal directed.  She laughs regularly.  No evidence of significant mood disturbance.  Her report of what sounds like "heart ache" over the recent death of her sister seems normal and expected.  Reframed as being her body's way of telling her the relationship mattered.  Reviewed red flags for not dealing with her sister's death in a healthy manner.  She denies them.    Looked at five domains:  positive emotions, relationships, achievement, meaningfulness and engagement and identified the last two as potential growth areas.  She reported she needs to be busier than she is currently. She is looking to increase the structure around her sleep as well.  I told her I would be happy to see her but at present, can not find a real reason for [place] to continue in therapy.  Her mood issues seem like normal varations and expected reactions to difficult events (i.e. the death of a loved one).  We reviewed other possibilities (denial, faking positive emotions in front of me etc...).  She denied any of these.  If Dr. Hulen Luster or Dr.  Gerilyn Pilgrim have a different impression, I would be happy to hear their assessment.  I told Jamie Salazar that she can call me or if I hear concerns about her, I might call her.  She agreed with this plan.  Orders: Therapy 40-50- min- [facility] ([address]) Therapy 20-30 min- [facility] ([address])  Complete Medication List: 1)  Hydrochlorothiazide 25 Mg Tabs (Hydrochlorothiazide) .... Take one daily 2)  Celexa 20 Mg Tabs (Citalopram hydrobromide) .... Take one daily 3)  Potassium Chloride 20 Meq/65ml (10%) Liqd (Potassium chloride) .... Take each day (3 teaspoons), dispense with one month supply 4)  Metronidazole 500 Mg Tabs (Metronidazole) .... Take 4 tabs one time by mouth

## 2010-05-26 NOTE — Assessment & Plan Note (Signed)
Summary: BP CHECK/EO  Nurse Visit Blood Pressure Check  Did patient rest for 60 secs or longer: yes   Which arm was used:   right What was the cuff size: thigh cuff Manual measurement:  158/90 Has patient taken their BP meds today:  patient not taking meds   If no, when was last dose:    Vital Signs:  Patient profile:   50 year old female BP sitting:   158 / 90  (right arm) Cuff size:   thigh  Vitals Entered By: Garen Grams LPN (May 24, 2009 1:42 PM)  Allergies: No Known Drug Allergies  Orders Added: 1)  Est Level 1- [facility] [[address]]

## 2010-05-26 NOTE — Miscellaneous (Signed)
Summary: Appt  Patient was told to follow up with you next week but you don't have any openings.  I scheduled her for monday the 19th and told her I would let you know.  Is this ok or did you want to squeeze her in next week?  Her home number is [phone]. Bradly Bienenstock  March 31, 2010 4:21 PM   Please tell her to call Dr. Pascal Lux and schedule an appt to discuss the last visit she had if she still is having problems.  Otherwise she can follow up with [facility] or Me when I have time.  Ellery Plunk MD  March 31, 2010 6:51 PM

## 2010-05-26 NOTE — Assessment & Plan Note (Signed)
Summary: routine visit/eo   Vital Signs:  Patient profile:   50 year old female Height:      63 inches Weight:      343.6 pounds BMI:     61.09 Temp:     98.8 degrees F oral Pulse rate:   91 / minute BP sitting:   139 / 91  (right arm) Cuff size:   large  Vitals Entered By: Gladstone Pih (October 04, 2009 8:46 AM) CC: F/U Is Patient Diabetic? No Pain Assessment Patient in pain? no        Primary Care Provider:  Hulen Luster  CC:  F/U.  History of Present Illness: Depression-  seeing Dr. Pascal Lux to discuss moods.  feels they are labile, mind rushing and has some trouble concentrating.  hasnt filled celexa.  interested in natural supplements.  BP- taking HCTZ, out of refills.  not exercisng but plans to.  concerned about her heart and CV risk.  Diet- will see Dr. Gerilyn Pilgrim today  Habits & Providers  Alcohol-Tobacco-Diet     Tobacco Status: never  Current Medications (verified): 1)  Hydrochlorothiazide 25 Mg Tabs (Hydrochlorothiazide) .... Take One Daily 2)  Celexa 20 Mg Tabs (Citalopram Hydrobromide) .... Take One Daily 3)  Potassium Chloride 20 Meq/62ml (10%) Liqd (Potassium Chloride) .... Take Each Day (3 Teaspoons), Dispense With One Month Supply  Allergies (verified): No Known Drug Allergies  Review of Systems       The patient complains of depression.  The patient denies anorexia, fever, and chest pain.    Physical Exam  General:  obese,  Head:  Normocephalic and atraumatic without obvious abnormalities. No apparent alopecia or balding. Lungs:  Normal respiratory effort, chest expands symmetrically. Lungs are clear to auscultation, no crackles or wheezes. Heart:  distant heart sounds Abdomen:  obese Psych:  mostly calm but tearful talking about sister.     Impression & Recommendations:  Problem # 1:  ESSENTIAL HYPERTENSION (ICD-401.9) Assessment Improved BP at goal today.  On HCTZ 25mg .  refilled.  continue. Her updated medication list for this problem  includes:    Hydrochlorothiazide 25 Mg Tabs (Hydrochlorothiazide) .Marland Kitchen... Take one daily  Orders: [facility]- Est  Level 4 ([address])  Problem # 2:  DEPRESSION/ANXIETY (ICD-300.4) Assessment: Unchanged sees Dr. Pascal Lux for this.  Pt seems anxious about her diagnoses, but unclear how accurate they are.  reviewed her strengths. not taking celexa.  resent in case she decides to get it.  emphasized importance of exercise for mood. Orders: [facility]- Est  Level 4 (82956)  Problem # 3:  POTASSIUM DEFICIENCY (ICD-276.8) Assessment: Unchanged pt not taking supplement because has trouble swallowing pills.  switched to the liquid.  recheck k today. Orders: Basic Met-[facility] ([address]08-65784) [facility]- Est  Level 4 (69629)  Problem # 4:  MORBID OBESITY (ICD-278.01) Assessment: Unchanged slight weight loss.  seeing Dr. Gerilyn Pilgrim today for nutrition counseling.   Orders: [facility]- Est  Level 4 ([address]41)  Complete Medication List: 1)  Hydrochlorothiazide 25 Mg Tabs (Hydrochlorothiazide) .... Take one daily 2)  Celexa 20 Mg Tabs (Citalopram hydrobromide) .... Take one daily 3)  Potassium Chloride 20 Meq/70ml (10%) Liqd (Potassium chloride) .... Take each day (3 teaspoons), dispense with one month supply  Patient Instructions: 1)  goal: walking 3-5 days/ week at the mall for 2)  consider to the grocery store for walking 3)  congrats for talking to the nutritionist 4)  keep up the good work Prescriptions: POTASSIUM CHLORIDE 20 MEQ/15ML (10%) LIQD (POTASSIUM CHLORIDE)  take each day (3 teaspoons), dispense with one month supply  #1 x 4   Entered and Authorized by:   Ellery Plunk MD   Signed by:   Ellery Plunk MD on 10/04/2009   Method used:   Electronically to        [facility] [phone]* (retail)       [address]       [place], [place]  [address]       Ph: [phone]       Fax: [phone]   RxID:   [id number] CELEXA 20 MG TABS (CITALOPRAM HYDROBROMIDE) take one daily  #30 x 4   Entered and  Authorized by:   Ellery Plunk MD   Signed by:   Ellery Plunk MD on 10/04/2009   Method used:   Electronically to        [facility] [phone]* (retail)       [address]       [place], [place]  [address]       Ph: [phone]       Fax: [phone]   RxID:   [id number] HYDROCHLOROTHIAZIDE 25 MG TABS (HYDROCHLOROTHIAZIDE) take one daily  #30 x 4   Entered and Authorized by:   Ellery Plunk MD   Signed by:   Ellery Plunk MD on 10/04/2009   Method used:   Electronically to        [facility] [phone]* (retail)       [address]       [place], [place]  [address]       Ph: [phone]       Fax: [phone]   RxID:   [id number]    Prevention & Chronic Care Immunizations   Influenza vaccine: Not documented    Tetanus booster: Not documented    Pneumococcal vaccine: Not documented  Colorectal Screening   Hemoccult: Not documented    Colonoscopy: Not documented  Other Screening   Pap smear: NEGATIVE FOR INTRAEPITHELIAL LESIONS OR MALIGNANCY.  (06/25/2009)    Mammogram: Not documented   Smoking status: never  (10/04/2009)  Lipids   Total Cholesterol: 177  (05/20/2009)   LDL: 107  (05/20/2009)   LDL Direct: Not documented   HDL: 58  (05/20/2009)   Triglycerides: 59  (05/20/2009)   Lipid panel due: 05/20/2010  Hypertension   Last Blood Pressure: 139 / 91  (10/04/2009)   Serum creatinine: 1.05  (06/25/2009)   Serum potassium 3.3  (06/25/2009)    Hypertension flowsheet reviewed?: Yes   Progress toward BP goal: At goal    Stage of readiness to change (hypertension management): Preparation  Self-Management Support :   Personal Goals (by the next clinic visit) :      Personal blood pressure goal: 140/90  (06/25/2009)   Hypertension self-management support: Referred for medical nutrition therapy  (10/04/2009)   Nursing Instructions: Refer for medical nutrition therapy (see order)    Diabetes Self Management Training  Referral Patient Name: Jamie Salazar Date Of Birth: March 23, 1961 MRN: [id number] Current Diagnosis:  POTASSIUM DEFICIENCY (ICD-276.8) 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

## 2010-05-26 NOTE — Progress Notes (Signed)
Summary: refill  Phone Note Refill Request Call back at Home Phone [phone] Message from:  Patient  Refills Requested: Medication #1:  HYDROCHLOROTHIAZIDE 25 MG TABS take one daily [facility] [address]  Initial call taken by: De Nurse,  March 11, 2010 9:00 AM    Prescriptions: CELEXA 20 MG TABS (CITALOPRAM HYDROBROMIDE) take one daily  #30 x 4   Entered and Authorized by:   Ellery Plunk MD   Signed by:   Ellery Plunk MD on 03/11/2010   Method used:   Electronically to        [facility] [phone]* (retail)       [address]       [place], [place]  [address]       Ph: [phone]       Fax: [phone]   RxID:   [id number] POTASSIUM CHLORIDE 20 MEQ/15ML (10%) LIQD (POTASSIUM CHLORIDE) take each day (3 teaspoons), dispense with one month supply  #1 x 4   Entered and Authorized by:   Ellery Plunk MD   Signed by:   Ellery Plunk MD on 03/11/2010   Method used:   Electronically to        [facility] [phone]* (retail)       [address]       [place], [place]  [address]       Ph: [phone]       Fax: [phone]   RxID:   [id number] HYDROCHLOROTHIAZIDE 25 MG TABS (HYDROCHLOROTHIAZIDE) take one daily  #30 x 4   Entered and Authorized by:   Ellery Plunk MD   Signed by:   Ellery Plunk MD on 03/11/2010   Method used:   Electronically to        [facility] [phone]* (retail)       [address]       [place], [place]  [address]       Ph: [phone]       Fax: [phone]   RxID:   [id number]

## 2010-06-08 ENCOUNTER — Encounter: Payer: Self-pay | Admitting: *Deleted

## 2010-09-05 ENCOUNTER — Other Ambulatory Visit: Payer: Self-pay | Admitting: Family Medicine

## 2010-09-05 NOTE — Telephone Encounter (Signed)
Refill request

## 2010-09-13 ENCOUNTER — Ambulatory Visit: Payer: Self-pay | Admitting: Family Medicine

## 2010-09-21 ENCOUNTER — Ambulatory Visit: Payer: Self-pay | Admitting: Family Medicine

## 2010-09-30 ENCOUNTER — Ambulatory Visit: Payer: Self-pay | Admitting: Family Medicine

## 2011-01-05 ENCOUNTER — Other Ambulatory Visit: Payer: Self-pay | Admitting: Family Medicine

## 2011-01-05 MED ORDER — HYDROCHLOROTHIAZIDE 25 MG PO TABS: 25.0000 mg | ORAL_TABLET | Freq: Every day | ORAL | Status: DC

## 2011-02-10 ENCOUNTER — Ambulatory Visit (INDEPENDENT_AMBULATORY_CARE_PROVIDER_SITE_OTHER): Payer: Self-pay | Admitting: Family Medicine

## 2011-02-10 ENCOUNTER — Encounter: Payer: Self-pay | Admitting: Family Medicine

## 2011-02-10 VITALS — BP 176/94 | HR 102 | Temp 98.7°F | Ht 63.0 in | Wt 340.3 lb

## 2011-02-10 DIAGNOSIS — L853 Xerosis cutis: Secondary | ICD-10-CM

## 2011-02-10 DIAGNOSIS — Z23 Encounter for immunization: Secondary | ICD-10-CM

## 2011-02-10 DIAGNOSIS — R350 Frequency of micturition: Secondary | ICD-10-CM

## 2011-02-10 DIAGNOSIS — I10 Essential (primary) hypertension: Secondary | ICD-10-CM

## 2011-02-10 DIAGNOSIS — E876 Hypokalemia: Secondary | ICD-10-CM

## 2011-02-10 DIAGNOSIS — N898 Other specified noninflammatory disorders of vagina: Secondary | ICD-10-CM | POA: Insufficient documentation

## 2011-02-10 DIAGNOSIS — R238 Other skin changes: Secondary | ICD-10-CM

## 2011-02-10 LAB — POCT URINALYSIS DIPSTICK
Bilirubin, UA: NEGATIVE
Ketones, UA: NEGATIVE
Leukocytes, UA: NEGATIVE
pH, UA: 5.5

## 2011-02-10 LAB — BASIC METABOLIC PANEL
CO2: 25 mEq/L (ref 19–32)
Chloride: 98 mEq/L (ref 96–112)
Creat: 0.99 mg/dL (ref 0.50–1.10)
Potassium: 3.3 mEq/L — ABNORMAL LOW (ref 3.5–5.3)

## 2011-02-10 LAB — POCT WET PREP (WET MOUNT)
Clue Cells Wet Prep HPF POC: NEGATIVE
Trichomonas Wet Prep HPF POC: NEGATIVE
Yeast Wet Prep HPF POC: NEGATIVE

## 2011-02-10 LAB — TSH: TSH: 2.732 u[IU]/mL (ref 0.350–4.500)

## 2011-02-10 NOTE — Assessment & Plan Note (Signed)
BP Readings from Last 3 Encounters:  02/10/11 176/94  04/11/10 152/94  03/31/10 131/88   Blood pressure worse today. We will check BMET today consider adding another agent besides HCTZ.

## 2011-02-10 NOTE — Patient Instructions (Signed)
I am checking your discharge and your potassium today  I will call you about the results

## 2011-02-10 NOTE — Assessment & Plan Note (Signed)
Patient with new discharge. Checked wet prep today. Will call the results

## 2011-02-10 NOTE — Assessment & Plan Note (Signed)
Previous potassium was normal. Stop her potassium supplement. We'll recheck her BMET today.

## 2011-02-10 NOTE — Progress Notes (Signed)
  Subjective:    Patient ID: Jamie Salazar, female    DOB: [birth year], 50 y.o.   MRN: [id number]  HPI Urine changes-patient states that she has a bubbly urine. There is no pain with this. She states that she knows she is protein in her urine when this happens. This is happened to her before however she does not think any workup has been done. She denies any new swelling. She denies increased frequency or irritation. Hypertension-patient states that she is taking her HCTZ as prescribed. She denies headaches shortness of breath or chest pain. Vaginal discharge-patient states that she has had a thin vaginal discharge and new odor for the last week. She is not sexually active. She does not feel irritated or itchy.   Review of Systems See above    Objective:   Physical Exam Vital signs reviewed General appearance - alert, well appearing, and in no distress and oriented to person, place, and time Heart - normal rate, regular rhythm, normal S1, S2, no murmurs, rubs, clicks or gallops Chest - clear to auscultation, no wheezes, rales or rhonchi, symmetric air entry, no tachypnea, retractions or cyanosis Abdomen - soft, nontender, nondistended, no masses or organomegaly morbidly obese. GYN-patient's exam was limited by body habitus. There was no odor or excessive discharge on exam. A swab was used to collect the specimen.        Assessment & Plan:

## 2011-02-14 ENCOUNTER — Telehealth: Payer: Self-pay | Admitting: Family Medicine

## 2011-02-14 NOTE — Telephone Encounter (Signed)
Results slightly abnormal, will forward to MD

## 2011-02-14 NOTE — Telephone Encounter (Signed)
Jamie Salazar is calling to see if her lab results are in.  She didn't want to rush Dr. Hulen Luster so she said she will wait until Friday, but if they are ready, she would appreciate a call.

## 2011-02-16 ENCOUNTER — Other Ambulatory Visit: Payer: Self-pay | Admitting: Family Medicine

## 2011-02-17 ENCOUNTER — Telehealth: Payer: Self-pay | Admitting: Family Medicine

## 2011-02-17 DIAGNOSIS — R300 Dysuria: Secondary | ICD-10-CM

## 2011-02-17 DIAGNOSIS — E876 Hypokalemia: Secondary | ICD-10-CM

## 2011-02-17 MED ORDER — POTASSIUM CHLORIDE 20 MEQ/15ML (10%) PO LIQD: 20.0000 meq | Freq: Every day | ORAL | Status: DC

## 2011-02-17 NOTE — Telephone Encounter (Signed)
Discussed labs.  Restart K.  Recheck in 2 weeks.

## 2011-02-20 ENCOUNTER — Other Ambulatory Visit: Payer: Self-pay

## 2011-02-20 ENCOUNTER — Ambulatory Visit (INDEPENDENT_AMBULATORY_CARE_PROVIDER_SITE_OTHER): Payer: Self-pay | Admitting: *Deleted

## 2011-02-20 VITALS — BP 124/82 | HR 80

## 2011-02-20 DIAGNOSIS — E876 Hypokalemia: Secondary | ICD-10-CM

## 2011-02-20 DIAGNOSIS — I10 Essential (primary) hypertension: Secondary | ICD-10-CM

## 2011-02-20 NOTE — Progress Notes (Signed)
CMP AND PTH INTACT DONE TODAY Jamie Salazar

## 2011-02-21 LAB — COMPREHENSIVE METABOLIC PANEL
AST: 14 U/L (ref 0–37)
Albumin: 3.8 g/dL (ref 3.5–5.2)
BUN: 16 mg/dL (ref 6–23)
Calcium: 9.3 mg/dL (ref 8.4–10.5)
Chloride: 101 mEq/L (ref 96–112)
Glucose, Bld: 115 mg/dL — ABNORMAL HIGH (ref 70–99)
Potassium: 3.7 mEq/L (ref 3.5–5.3)
Sodium: 139 mEq/L (ref 135–145)
Total Protein: 7.9 g/dL (ref 6.0–8.3)

## 2011-02-21 NOTE — Progress Notes (Signed)
Patient in for labs and requested BP check. BP , LA 124/82 and RA 122/78. Pulse 80. Stets BP was elevated at last visit.

## 2011-02-22 ENCOUNTER — Ambulatory Visit (INDEPENDENT_AMBULATORY_CARE_PROVIDER_SITE_OTHER): Payer: Self-pay | Admitting: Family Medicine

## 2011-02-22 ENCOUNTER — Encounter: Payer: Self-pay | Admitting: Family Medicine

## 2011-02-22 DIAGNOSIS — M1710 Unilateral primary osteoarthritis, unspecified knee: Secondary | ICD-10-CM

## 2011-02-22 DIAGNOSIS — I10 Essential (primary) hypertension: Secondary | ICD-10-CM

## 2011-02-22 DIAGNOSIS — R809 Proteinuria, unspecified: Secondary | ICD-10-CM

## 2011-02-22 DIAGNOSIS — E213 Hyperparathyroidism, unspecified: Secondary | ICD-10-CM | POA: Insufficient documentation

## 2011-02-22 LAB — PHOSPHORUS: Phosphorus: 2.2 mg/dL — ABNORMAL LOW (ref 2.3–4.6)

## 2011-02-22 MED ORDER — LISINOPRIL 10 MG PO TABS: 10.0000 mg | ORAL_TABLET | Freq: Every day | ORAL | Status: DC

## 2011-02-22 NOTE — Assessment & Plan Note (Signed)
Check 24 hour urine for protein.  Switch to lisinopril.

## 2011-02-22 NOTE — Assessment & Plan Note (Signed)
Discussed wieght loss and using tylenol for pain.  Consider xrays in the future.

## 2011-02-22 NOTE — Assessment & Plan Note (Signed)
PTH mild elevation in setting of normal Ca.  Will check vit D 1,25 and 25, phos, ionized ca, urinary protein and calcium

## 2011-02-22 NOTE — Patient Instructions (Addendum)
I would like you to have your urine checked--this is a 24 hour sample.  I would like you to stop the HCTZ and start lisinopril  Please come back in one week for a recheck on your blood work  I will be out of the office for 3 weeks.  I will have my colleague call you with the results.   Stop ALL supplements for now.  Come back in 1 week for BP check and labs

## 2011-02-22 NOTE — Assessment & Plan Note (Signed)
Pt rechecked BP and was at goal. Investigating urine protein.  Switch to lisinopril.  Recheck 1 week for BMET and BP.

## 2011-02-23 LAB — VITAMIN D 25 HYDROXY (VIT D DEFICIENCY, FRACTURES): Vit D, 25-Hydroxy: 18 ng/mL — ABNORMAL LOW (ref 30–89)

## 2011-02-23 NOTE — Progress Notes (Signed)
  Subjective:    Patient ID: Jamie Salazar, female    DOB: 1961-01-14, 50 y.o.   MRN: [id number]  HPI Patient presents today for followup. Her blood pressure checked at her nurse visit was normal. She denies headaches or shortness of breath. She denies any muscle cramping.  Reviewed patient's supplements. Patient has been taking a passion flower supplement. She is now taking St. John's wort. She took ginkgo for about a month. SHe is not taking that anymore. She takes these supplements for anxiety.  Arthritis-patient notes arthritis in knees bilaterally. This has been chronic for her. She states it is getting worse. She takes some Tylenol for this. She denies any swelling in her knees. She denies any heat or redness. Review of Systems Denies CP, SOB, HA, N/V/D, fever     Objective:   Physical Exam Vital signs reviewed General appearance - alert, well appearing, and in no distress and oriented to person, place, and time Heart - normal rate, regular rhythm, normal S1, S2, no murmurs, rubs, clicks or gallops Chest - clear to auscultation, no wheezes, rales or rhonchi, symmetric air entry, no tachypnea, retractions or cyanosis MSK-crepitus of bilateral knees. Full range of motion. No swelling though exam is limited by body habitus.       Assessment & Plan:  ESSENTIAL HYPERTENSION Pt rechecked BP and was at goal. Investigating urine protein.  Switch to lisinopril.  Recheck 1 week for BMET and BP.    Mild hyperparathyroidism PTH mild elevation in setting of normal Ca.  Will check vit D 1,25 and 25, phos, ionized ca, urinary protein and calcium  OSTEOARTHRITIS, KNEES, BILATERAL Discussed wieght loss and using tylenol for pain.  Consider xrays in the future.    Protein in urine Check 24 hour urine for protein.  Switch to lisinopril.

## 2011-03-01 ENCOUNTER — Ambulatory Visit: Payer: Self-pay | Admitting: Family Medicine

## 2011-03-06 ENCOUNTER — Encounter: Payer: Self-pay | Admitting: Family Medicine

## 2011-03-06 ENCOUNTER — Ambulatory Visit (INDEPENDENT_AMBULATORY_CARE_PROVIDER_SITE_OTHER): Payer: Self-pay | Admitting: Family Medicine

## 2011-03-06 ENCOUNTER — Other Ambulatory Visit: Payer: Self-pay | Admitting: *Deleted

## 2011-03-06 VITALS — BP 150/88 | HR 82 | Ht 63.0 in | Wt 351.0 lb

## 2011-03-06 DIAGNOSIS — R809 Proteinuria, unspecified: Secondary | ICD-10-CM

## 2011-03-06 DIAGNOSIS — I10 Essential (primary) hypertension: Secondary | ICD-10-CM

## 2011-03-06 NOTE — Progress Notes (Signed)
  Subjective:    Patient ID: Jamie Salazar, female    DOB: 1960/08/03, 50 y.o.   MRN: [id number]  HPI  [name] patient of Jamie Salazar who is here for follow-up for hypertension and proteinuria:  1.  HTN:  Patient started on Lisinopril last visit 10 mg by mouth. She was on hydrochlorothiazide 25 mg by mouth prior to this. She feels she is retaining fluid. On review of her chart she was 10 pounds since last visit 2 weeks ago.  Not checking BPs regularly at home.  No HA, CP, dizziness, shortness of breath, palpitations, or LE swelling.   BP Readings from Last 3 Encounters:  03/06/11 150/88  02/22/11 138/88  02/21/11 124/82    2. Proteinuria:  Found on recent UA. She's brought in a 24-hour urine today for analysis.  Denies any fevers, chills, foamy urine. Please see above for edema.  Review of Systems See HPI above for review of systems.       Objective:   Physical Exam Gen:  Alert, cooperative patient who appears stated age in no acute distress.  Vital signs reviewed.  Obese Cardiac:  Distant heart sounds. Lungs: Distant breath sounds. Abdomen: Soft nondistended nontender. Extremities: Trace edema bilateral ankles. Do not appreciate edema that she states she has.        Assessment & Plan:

## 2011-03-06 NOTE — Assessment & Plan Note (Signed)
Please see hypertension below regarding lisinopril. Patient did bring in her 24-hour urine today.

## 2011-03-06 NOTE — Patient Instructions (Addendum)
Make an appt to see Dr. Hulen Luster in 2-3 weeks, whenever she's back. We'll check your bloodwork today.  Stop taking the Lisinopril because of the swelling.  Go back on the HCTZ.  Talk about this with Dr. Hulen Luster when you see her again.

## 2011-03-06 NOTE — Assessment & Plan Note (Signed)
Patient does have 10 pound weight gain since last visit. Unclear if this is secondary to mechanical problem with scales in clinic or if she really is hanging onto this much fluid after being started on her lisinopril. Patient's blood pressure is not at goal. Will leave long-term hypertension management up to patient and PCP; for today recommended patient stop her lisinopril because of concern for edema until we can check serum creatinine. Restart her hydrochlorothiazide for blood pressure control until she can see her PCP again.

## 2011-03-07 LAB — BASIC METABOLIC PANEL
BUN: 14 mg/dL (ref 6–23)
CO2: 28 mEq/L (ref 19–32)
Chloride: 104 mEq/L (ref 96–112)
Potassium: 4.2 mEq/L (ref 3.5–5.3)

## 2011-03-07 LAB — CREATININE, URINE, 24 HOUR: Creatinine, Urine: 170.1 mg/dL

## 2011-03-15 ENCOUNTER — Telehealth: Payer: Self-pay | Admitting: Family Medicine

## 2011-03-15 NOTE — Telephone Encounter (Signed)
Patient would like a call today to get lab results.  Left an alternate number to reach her.

## 2011-03-15 NOTE — Telephone Encounter (Signed)
Returned call to patient.  Discussed lab results from 02/22/11 and 03/06/11 office visits.  Patient has follow-up appt with PCP on 03/30/11 for proteinuria, low Vit D & phosphorus levels.

## 2011-03-28 ENCOUNTER — Other Ambulatory Visit: Payer: Self-pay | Admitting: Family Medicine

## 2011-03-28 DIAGNOSIS — Z1231 Encounter for screening mammogram for malignant neoplasm of breast: Secondary | ICD-10-CM

## 2011-03-29 ENCOUNTER — Other Ambulatory Visit: Payer: Self-pay | Admitting: *Deleted

## 2011-03-29 DIAGNOSIS — Z1231 Encounter for screening mammogram for malignant neoplasm of breast: Secondary | ICD-10-CM

## 2011-03-30 ENCOUNTER — Encounter: Payer: Self-pay | Admitting: Family Medicine

## 2011-03-30 ENCOUNTER — Ambulatory Visit (INDEPENDENT_AMBULATORY_CARE_PROVIDER_SITE_OTHER): Payer: Medicare Other | Admitting: Family Medicine

## 2011-03-30 VITALS — BP 156/98 | HR 86 | Temp 97.9°F | Ht 63.0 in | Wt 343.0 lb

## 2011-03-30 DIAGNOSIS — E876 Hypokalemia: Secondary | ICD-10-CM

## 2011-03-30 DIAGNOSIS — R809 Proteinuria, unspecified: Secondary | ICD-10-CM

## 2011-03-30 DIAGNOSIS — E213 Hyperparathyroidism, unspecified: Secondary | ICD-10-CM

## 2011-03-30 DIAGNOSIS — I10 Essential (primary) hypertension: Secondary | ICD-10-CM

## 2011-03-30 DIAGNOSIS — M1710 Unilateral primary osteoarthritis, unspecified knee: Secondary | ICD-10-CM

## 2011-03-30 NOTE — Patient Instructions (Signed)
Take lisinopril and HCTZ every day Make a nurse visit for 1 week for BP recheck and lab Come see me in 1 month Start passion flower and see if that helps anxiety Try tylenol arthritis twice a day 1000mg  (2 tabs)

## 2011-04-03 NOTE — Progress Notes (Signed)
  Subjective:    Patient ID: Jamie Salazar, female    DOB: September 23, 1960, 50 y.o.   MRN: [id number]  HPI  Hypertension-patient's blood pressures of today. At last visit with Dr. Gwendolyn Grant he changed her back to HCTZ. She has not been taking her lisinopril. She denies any chest pain, shortness of breath, headache. She feels that her legs are less edematous. Arthritis-patient with bilateral knee pain times several years. She does not take any medication for this. There has never been any injury. Lab work-patient to followup lab work for protein in urine, elevated calcium and PTH. I explained to patient that these labs are within normal ranges at this time. She is fairly to this. He plan to recheck him in 6-12 months.    Review of Systems See above    Objective:   Physical Exam Vital signs reviewed General appearance - alert, well appearing, and in no distress and oriented to person, place, and time Heart - normal rate, regular rhythm, normal S1, S2, no murmurs, rubs, clicks or gallops Chest - clear to auscultation, no wheezes, rales or rhonchi, symmetric air entry, no tachypnea, retractions or cyanosis Extremities - peripheral pulses normal, no pedal edema, no clubbing or cyanosis MSK-bilateral knees with mild crepitus. Full range of motion. Exam limited by body habitus, however this does not appear to be any surrounding swelling.       Assessment & Plan:

## 2011-04-03 NOTE — Assessment & Plan Note (Signed)
Recheck BMET today. If abnormal on repeat BMET after starting lisinopril and HCTZ, will begin oral supplementation.

## 2011-04-03 NOTE — Assessment & Plan Note (Signed)
Patient to take Tylenol arthritis 1000 mg twice a day. Will consider adding tramadol this is not enough. Discussed benefits of weight loss with patient

## 2011-04-03 NOTE — Assessment & Plan Note (Signed)
Labs came out in normal range. This may be eventually seemed to be hyperparathyroidism. Will recheck in one year.

## 2011-04-03 NOTE — Assessment & Plan Note (Signed)
24 hour urine was essentially normal. Advised patient that we do not need to follow this.

## 2011-04-03 NOTE — Assessment & Plan Note (Signed)
BP: 156/98 mmHg  Blood pressure elevated today. Asked patient to take both lisinopril and HCTZ. We'll check BMET today and again in 1 week at a nurse visit for blood pressure check.

## 2011-04-06 ENCOUNTER — Other Ambulatory Visit: Payer: Medicare Other

## 2011-04-06 ENCOUNTER — Ambulatory Visit (INDEPENDENT_AMBULATORY_CARE_PROVIDER_SITE_OTHER): Payer: Medicare Other | Admitting: *Deleted

## 2011-04-06 VITALS — BP 120/70 | HR 76

## 2011-04-06 DIAGNOSIS — I10 Essential (primary) hypertension: Secondary | ICD-10-CM

## 2011-04-06 NOTE — Progress Notes (Signed)
Patient In for BP check. BP checked manually using thigh cuff. BP RA 120/70 pulse 76. States she is taking medications lisinopril and HCTZ as directed. Has follow up appointment with PCP Apr 28, 2011

## 2011-04-06 NOTE — Progress Notes (Signed)
Bmp done today Jamie Salazar 

## 2011-04-07 LAB — BASIC METABOLIC PANEL
BUN: 18 mg/dL (ref 6–23)
CO2: 28 mEq/L (ref 19–32)
Calcium: 9.4 mg/dL (ref 8.4–10.5)
Creat: 1.02 mg/dL (ref 0.50–1.10)
Glucose, Bld: 102 mg/dL — ABNORMAL HIGH (ref 70–99)

## 2011-04-26 ENCOUNTER — Ambulatory Visit
Admission: RE | Admit: 2011-04-26 | Discharge: 2011-04-26 | Disposition: A | Discharge status: Home / Self Care | Payer: Medicare Other | Source: Ambulatory Visit | Attending: *Deleted | Admitting: *Deleted

## 2011-04-26 DIAGNOSIS — Z1231 Encounter for screening mammogram for malignant neoplasm of breast: Secondary | ICD-10-CM

## 2011-04-28 ENCOUNTER — Ambulatory Visit
Admission: RE | Admit: 2011-04-28 | Discharge: 2011-04-28 | Disposition: A | Discharge status: Home / Self Care | Payer: Medicare Other | Source: Ambulatory Visit | Attending: Family Medicine | Admitting: Family Medicine

## 2011-04-28 ENCOUNTER — Ambulatory Visit (INDEPENDENT_AMBULATORY_CARE_PROVIDER_SITE_OTHER): Payer: Medicare Other | Admitting: Family Medicine

## 2011-04-28 ENCOUNTER — Other Ambulatory Visit: Payer: Self-pay | Admitting: Family Medicine

## 2011-04-28 VITALS — BP 144/76 | HR 80 | Ht 64.0 in | Wt 348.0 lb

## 2011-04-28 DIAGNOSIS — M1710 Unilateral primary osteoarthritis, unspecified knee: Secondary | ICD-10-CM

## 2011-04-28 DIAGNOSIS — I10 Essential (primary) hypertension: Secondary | ICD-10-CM

## 2011-04-28 DIAGNOSIS — I8390 Asymptomatic varicose veins of unspecified lower extremity: Secondary | ICD-10-CM

## 2011-04-28 MED ORDER — LISINOPRIL-HYDROCHLOROTHIAZIDE 10-12.5 MG PO TABS: 1.0000 | ORAL_TABLET | Freq: Every day | ORAL | Status: DC

## 2011-04-28 NOTE — Progress Notes (Signed)
  Subjective:    Patient ID: Jamie Salazar, female    DOB: [birth year], 51 y.o.   MRN: [id number]  HPI Hypertension-patient has been taking lisinopril and HCTZ. She's having no side effects. She has not checked her blood pressure at home.  Knee pain-patient with right greater than left knee pain. This is chronic and continuous. It feels better when she moves it around a little bit. This will get stiff occasionally.it is worse after sitting for long period of time. No swelling or heat. No injury to the area  Leg swelling-patient has areas of localized swelling that she thinks her fluid. Her legs seemed to stay swollen and feel tired.   Review of Systems Denies headache, chest pain, shortness of breath    Objective:   Physical Exam Gen-obese, no acute distress Heart-Regular rate and rhythm no murmur Chest - clear to auscultation, no wheezes, rales or rhonchi, symmetric air entry, no tachypnea, retractions or cyanosis Extremities-bilateral knees with crepitus, full range of motion. Bilateral lower legs with evidence of varicose veins, 1+ pitting edema.  2+ dorsalis pedis pulses bilaterally     Assessment & Plan:

## 2011-04-28 NOTE — Assessment & Plan Note (Signed)
Worse. Patient has not started taking increased doses of Tylenol. Will advise 2 tabs 3 times a day of Tylenol. Will send for physical therapy in case this is helpful for her knee pain. Discussed weight loss again. Will send for x-rays bilateral standing.

## 2011-04-28 NOTE — Patient Instructions (Signed)
I have put in an order for your x-rays. Please go to [facility] imaging to have these done. I will call you with the results. Please by some compression hose at the drugstore to help with his leg swelling. We will call you with a physical therapy appointment for your knees. Please take the Tylenol or acetaminophen 2 tablets 3 times a day.

## 2011-04-28 NOTE — Assessment & Plan Note (Signed)
Will change to a combo pill lisinopril, HCTZ. Will see back for blood pressure check.

## 2011-04-28 NOTE — Assessment & Plan Note (Signed)
In legs, left greater than right. Will advise compression hose for now.

## 2011-05-01 ENCOUNTER — Telehealth: Payer: Self-pay | Admitting: Family Medicine

## 2011-05-01 NOTE — Telephone Encounter (Signed)
Message copied by Reginold Agent on Mon May 01, 2011  4:55 PM ------      Message from: Barbaraann Barthel      Created: Sat Apr 29, 2011  8:14 PM                   ----- Message -----         From: Rad Results In Interface         Sent: 04/28/2011  10:49 AM           To: Barbaraann Barthel

## 2011-05-01 NOTE — Telephone Encounter (Signed)
Left VM.  Called to tell her about xrays.  They show knee arthritis.  Continue current plan.

## 2011-05-02 NOTE — Telephone Encounter (Signed)
Pt needs to know when Dr. Hulen Luster wants her to f/u here?

## 2011-05-02 NOTE — Telephone Encounter (Signed)
Not mentioned in OV.  Will forward to MD .Milas Gain, Maryjo Rochester

## 2011-05-02 NOTE — Telephone Encounter (Signed)
Pt informed. Jamie Salazar  

## 2011-05-02 NOTE — Telephone Encounter (Signed)
3 months or earlier if needed

## 2011-05-09 ENCOUNTER — Ambulatory Visit: Payer: Medicare Other | Attending: Family Medicine

## 2011-05-09 DIAGNOSIS — M25569 Pain in unspecified knee: Secondary | ICD-10-CM | POA: Insufficient documentation

## 2011-05-09 DIAGNOSIS — M25669 Stiffness of unspecified knee, not elsewhere classified: Secondary | ICD-10-CM | POA: Insufficient documentation

## 2011-05-09 DIAGNOSIS — IMO0001 Reserved for inherently not codable concepts without codable children: Secondary | ICD-10-CM | POA: Insufficient documentation

## 2011-05-15 ENCOUNTER — Ambulatory Visit: Payer: Medicare Other

## 2011-05-18 ENCOUNTER — Telehealth: Payer: Self-pay | Admitting: Family Medicine

## 2011-05-18 ENCOUNTER — Ambulatory Visit: Payer: Medicare Other

## 2011-05-18 NOTE — Telephone Encounter (Signed)
Spoke with patient and she states she had been on HCTZ 25 mg and lisinopril 10 mg and at last visit ask MD  If she could take a combo tab. She just picked it up yesterday and realized it is lisinopril 10 mg and HCTZ 12.5 mg. Should she continue this since it is the reduced dose. . She picked up 3 month's worth yesterday. Will send message to Dr. Katrinka Blazing.

## 2011-05-18 NOTE — Telephone Encounter (Signed)
Patient notified and she voices understanding.  Appointment scheduled with PCP 02/05.

## 2011-05-18 NOTE — Telephone Encounter (Signed)
Attempted to call patient patient did answer phone but did not talk. We will send a wonderful staff to attend a car again to tell her that I would continue to try to take the medication that she didn't pick up and have her see Dr. Hulen Luster in the next 2 weeks and if her blood pressure is elevated then we will consider changing.

## 2011-05-18 NOTE — Telephone Encounter (Signed)
Patient is calling about her BP med being changed her dose of HCTZ from 25 mg to 10 - 12.5mg .  She needs clarification.

## 2011-05-22 ENCOUNTER — Ambulatory Visit: Payer: Medicare Other

## 2011-05-24 ENCOUNTER — Ambulatory Visit: Payer: Medicare Other

## 2011-05-30 ENCOUNTER — Ambulatory Visit (INDEPENDENT_AMBULATORY_CARE_PROVIDER_SITE_OTHER): Payer: Medicare Other | Admitting: Family Medicine

## 2011-05-30 ENCOUNTER — Encounter: Payer: Self-pay | Admitting: Family Medicine

## 2011-05-30 DIAGNOSIS — M1710 Unilateral primary osteoarthritis, unspecified knee: Secondary | ICD-10-CM

## 2011-05-30 DIAGNOSIS — I10 Essential (primary) hypertension: Secondary | ICD-10-CM

## 2011-05-30 NOTE — Assessment & Plan Note (Signed)
Patient feels she has gotten good relief from her physical therapy. She does not think she needs to continue with that she can do the exercises at home. She'll continue Tylenol when necessary for pain.

## 2011-05-30 NOTE — Assessment & Plan Note (Signed)
BP Readings from Last 3 Encounters:  05/30/11 136/88  04/28/11 144/76  04/06/11 120/70   blood pressure under good control. Continue current management.

## 2011-05-30 NOTE — Patient Instructions (Signed)
You are doing great!!!!!  Continue the physical therapy exercises  I am giving you the colonoscopy paperwork today

## 2011-05-30 NOTE — Progress Notes (Signed)
  Subjective:    Patient ID: Jamie Salazar, female    DOB: 1960/10/16, 51 y.o.   MRN: [id number]  HPI Knee pain-patient feels that her knee pain is significantly improved with her physical therapy. She says that she has learned some regular exercises to do at home. She still occasionally takes Tylenol for pain.  Hypertension-patient states she is taking her medications with no problem. She denies dizziness, shortness of breath, headache, chest pain. She's not take her blood pressure at home.   Review of Systems See above    Objective:   Physical Exam  Vital signs reviewed General appearance - alert, well appearing, and in no distress and oriented to person, place, and time Heart - normal rate, regular rhythm, normal S1, S2, no murmurs, rubs, clicks or gallops Chest - clear to auscultation, no wheezes, rales or rhonchi, symmetric air entry, no tachypnea, retractions or cyanosis Knees-bilaterally, knees are without swelling. Mild crepitus. Full range of motion.      Assessment & Plan:

## 2011-05-31 ENCOUNTER — Telehealth: Payer: Self-pay | Admitting: Family Medicine

## 2011-05-31 ENCOUNTER — Ambulatory Visit: Payer: Medicare Other

## 2011-05-31 NOTE — Telephone Encounter (Signed)
Pt informed that she is to stop the Lisinopril and HCTZ (individually) and to start the combo pill according to my records.  Pt agreeable, but advised that I would double check with MD this afternoon and call her back if the instructions are different. Fleeger, Maryjo Rochester

## 2011-05-31 NOTE — Telephone Encounter (Signed)
That is correct 

## 2011-05-31 NOTE — Telephone Encounter (Signed)
Call at home or on cell.  Patient is calling with some questions about her Lisinopril instructions and for the HCTZ.

## 2011-07-12 ENCOUNTER — Encounter: Payer: Self-pay | Admitting: Family Medicine

## 2011-07-12 ENCOUNTER — Other Ambulatory Visit: Payer: Self-pay | Admitting: Family Medicine

## 2011-07-12 NOTE — Telephone Encounter (Signed)
This encounter was created in error - please disregard.

## 2011-07-12 NOTE — Telephone Encounter (Signed)
error 

## 2011-09-16 ENCOUNTER — Ambulatory Visit (INDEPENDENT_AMBULATORY_CARE_PROVIDER_SITE_OTHER): Payer: Medicare Other | Admitting: Family Medicine

## 2011-09-16 ENCOUNTER — Ambulatory Visit: Payer: Medicare Other

## 2011-09-16 VITALS — BP 121/74 | HR 106 | Temp 97.9°F | Resp 20 | Ht 63.25 in | Wt 334.6 lb

## 2011-09-16 DIAGNOSIS — M79676 Pain in unspecified toe(s): Secondary | ICD-10-CM

## 2011-09-16 DIAGNOSIS — M79609 Pain in unspecified limb: Secondary | ICD-10-CM

## 2011-09-16 DIAGNOSIS — M109 Gout, unspecified: Secondary | ICD-10-CM

## 2011-09-16 MED ORDER — PREDNISONE 20 MG PO TABS: ORAL_TABLET | ORAL | Status: DC

## 2011-09-16 NOTE — Progress Notes (Signed)
  Subjective:    Patient ID: Jamie Salazar, female    DOB: Nov 07, 1960, 51 y.o.   MRN: [id number]  HPI 51 yo female here with big toe injury.  THinks she Stubbed it about 2 weeks ago (vague memory of).  Several days later after pain, started to swell.  Hurts to walk on it.  Some pain with touching.  Red.  No known history of gout.    Review of Systems Negative except as per HPI     Objective:   Physical Exam Morbidly obese NAD Right first MTP swollen, erythematous, equisitely tender to palpation.    [facility] Primary radiology reading by Dr. Georgiana Shore: No fracture or dislocation     Assessment & Plan:  Toe pain Gout  No diabetes, recent glucoses okay.  Prednisone taper given duration of symptoms.

## 2011-09-20 ENCOUNTER — Ambulatory Visit: Payer: Medicare Other | Admitting: Family Medicine

## 2011-09-27 ENCOUNTER — Encounter: Payer: Self-pay | Admitting: Family Medicine

## 2011-09-27 ENCOUNTER — Ambulatory Visit (INDEPENDENT_AMBULATORY_CARE_PROVIDER_SITE_OTHER): Payer: Medicare Other | Admitting: Family Medicine

## 2011-09-27 VITALS — BP 110/70 | HR 88 | Temp 98.6°F | Ht 63.25 in | Wt 331.0 lb

## 2011-09-27 DIAGNOSIS — M109 Gout, unspecified: Secondary | ICD-10-CM | POA: Insufficient documentation

## 2011-09-27 DIAGNOSIS — M1710 Unilateral primary osteoarthritis, unspecified knee: Secondary | ICD-10-CM

## 2011-09-27 DIAGNOSIS — J309 Allergic rhinitis, unspecified: Secondary | ICD-10-CM

## 2011-09-27 DIAGNOSIS — I10 Essential (primary) hypertension: Secondary | ICD-10-CM

## 2011-09-27 DIAGNOSIS — IMO0002 Reserved for concepts with insufficient information to code with codable children: Secondary | ICD-10-CM

## 2011-09-27 LAB — COMPREHENSIVE METABOLIC PANEL
AST: 14 U/L (ref 0–37)
Albumin: 3.9 g/dL (ref 3.5–5.2)
BUN: 10 mg/dL (ref 6–23)
CO2: 24 mEq/L (ref 19–32)
Calcium: 9.4 mg/dL (ref 8.4–10.5)
Chloride: 99 mEq/L (ref 96–112)
Creat: 1.02 mg/dL (ref 0.50–1.10)
Glucose, Bld: 120 mg/dL — ABNORMAL HIGH (ref 70–99)
Potassium: 3.9 mEq/L (ref 3.5–5.3)

## 2011-09-27 LAB — URIC ACID: Uric Acid, Serum: 9.6 mg/dL — ABNORMAL HIGH (ref 2.4–7.0)

## 2011-09-27 MED ORDER — POTASSIUM CHLORIDE 20 MEQ/15ML (10%) PO LIQD: 20.0000 meq | Freq: Every day | ORAL | Status: DC

## 2011-09-27 NOTE — Progress Notes (Signed)
  Subjective:    Patient ID: Jamie Salazar, female    DOB: Dec 28, 1960, 51 y.o.   MRN: [id number]  HPI  Gout- patient was seen at urgent care and was diagnosed with gout of the first MTP. She was treated with 6 days of prednisone. She says that the redness and swelling are down but she still has some stiffness in the joint. These records are reviewed with her including the x-ray. The x-ray was normal. There is no lab work done. She says she did have 2 days of loose stool with the prednisone but otherwise did fine.  ear pain-patient says that she is having some right ear pressure. She's also having some nasal drainage and some mild cough with postnasal drip. She is taking her Zyrtec intermittently. She does not want to take any other medications for this.  Hypertension-patient is taking her lisinopril and HCTZ every day. She is not have chest pain, headache, dizziness, shortness of breath. She is not checking her blood pressure at home. She is not having trouble taking her medications. She continues on the potassium supplement.  Knee arthritis-patient continues to have pain in her knees. She says that she was better when she was in physical therapy exercises but she has stopped. She feels like she is trouble with balance. She is worried she will fall. This is getting slightly worse over time. She also has some occasional numbness on her right thigh laterally. This will come and go. She says she is tingling when this happens.  Nonsmoker Review of Systems See above    Objective:   Physical Exam  Vital signs reviewed General appearance - alert, well appearing, and in no distress Heart - normal rate, regular rhythm, normal S1, S2, no murmurs, rubs, clicks or gallops Chest - clear to auscultation, no wheezes, rales or rhonchi, symmetric air entry, no tachypnea, retractions or cyanosis Ears -right TM slightly retracted and opaque no injection Nose - normal and patent, no erythema, discharge or  polyps Foot-first MTP of the right foot with mild loss of range of motion. There is no erythema, heat. It is nontender.      Assessment & Plan:

## 2011-09-27 NOTE — Assessment & Plan Note (Signed)
Check BMET and uric acid today. Consider stopping HCTZ if uric acid elevated. Would treat with allopurinol if has another attack.

## 2011-09-27 NOTE — Assessment & Plan Note (Signed)
BP Readings from Last 3 Encounters:  09/27/11 110/70  09/16/11 121/74  05/30/11 136/88   Blood pressure normal today. Consider stopping HCTZ if continues to be low normal. Check BMET today.

## 2011-09-27 NOTE — Patient Instructions (Signed)
congrats on losing weight...what a great start!  Please try nasal saline (a spray from the pharmacy) 2 sprays several times/ day  Please work on those knee exercises  Can I do anything on my own to prevent gout attacks? -- Yes. If you are overweight, losing weight can help relieve gout. Plus, you can make changes to your diet that may help prevent future attacks.  You should cut down on:  Red meat and seafood Beer and hard alcohol, such as gin or vodka Foods and drinks that have high-fructose corn syrup (that includes most sodas, and store-bought cakes and cookies) Instead, you should eat lots of:  Low-fat dairy products, such as low-fat milk, cheese, and yogurt Whole grains and vegetables  I think that your numbness on your leg is a little bit of nerve pinching called meralgia paresthetica  Meralgia paresthetica (MP) is a disorder characterized by tingling, numbness, and burning pain in the outer side of the thigh. It occurs in men more than women. MP is generally found in middle-aged or overweight people. Sometimes, the disorder may disappear.  CAUSES  The disorder is caused by a nerve in the thigh being squeezed (compressed). MP may be associated with tight clothing, pregnancy, diabetes, and being overweight (obese).  SYMPTOMS  Tingling, numbness, and burning in the outer thigh.  An area of the skin may be painful and sensitive to the touch.  The symptoms often worsen after walking or standing.  TREATMENT  Treatment is based on your symptoms and is mainly supportive. Treatment may include:  Wearing looser clothing.  Losing weight.  Avoiding prolonged standing or walking.  Taking medication.  Surgery if the pain is peristent or severe.

## 2011-09-27 NOTE — Assessment & Plan Note (Signed)
May be causing some pressure in her ear. Advised nasal saline. Recommended nasal steroids the patient is not interested. Continue Zyrtec

## 2011-09-27 NOTE — Assessment & Plan Note (Signed)
Encouraged her to continue PT exercises at home. Plan to have her see physical therapy for evaluation for cane considering  problems with balance.

## 2011-09-29 ENCOUNTER — Telehealth: Payer: Self-pay | Admitting: Family Medicine

## 2011-09-29 MED ORDER — LISINOPRIL 10 MG PO TABS: 10.0000 mg | ORAL_TABLET | Freq: Every day | ORAL | Status: DC

## 2011-09-29 NOTE — Telephone Encounter (Signed)
Left VM for pt about results  Keep taking potassium supplements Stop HCTZ Sent new script to pharmacy for lisinopril

## 2011-10-02 NOTE — Telephone Encounter (Signed)
Tried calling patient, no answer, unable to leave message. 

## 2011-10-04 NOTE — Telephone Encounter (Signed)
Tried calling, unable to leave message, will send letter.

## 2011-10-09 ENCOUNTER — Telehealth: Payer: Self-pay | Admitting: Family Medicine

## 2011-10-09 NOTE — Telephone Encounter (Signed)
Patient is calling because she is having a flare up of gout and needs some relief.  She ate some Hamburger which she thinks caused the flare up.  She uses Statistician at [facility].

## 2011-10-11 NOTE — Telephone Encounter (Signed)
Please have patient come in for evaluation. That way, we can discuss which medicine will work best for this problem.

## 2011-10-13 ENCOUNTER — Telehealth: Payer: Self-pay

## 2011-10-13 NOTE — Telephone Encounter (Signed)
Pt is having another flare up and would like to see if Dr Georgiana Shore or another Dr would prescribe her another refill for gout pt states it is keeping her up at night.Jamie KitchenMarland Salazar

## 2011-10-14 ENCOUNTER — Ambulatory Visit (INDEPENDENT_AMBULATORY_CARE_PROVIDER_SITE_OTHER): Payer: Medicare Other | Admitting: Family Medicine

## 2011-10-14 VITALS — BP 126/83 | HR 98 | Temp 98.3°F | Resp 16 | Ht 63.0 in | Wt 331.0 lb

## 2011-10-14 DIAGNOSIS — M79609 Pain in unspecified limb: Secondary | ICD-10-CM

## 2011-10-14 DIAGNOSIS — R609 Edema, unspecified: Secondary | ICD-10-CM

## 2011-10-14 DIAGNOSIS — M79671 Pain in right foot: Secondary | ICD-10-CM

## 2011-10-14 DIAGNOSIS — R739 Hyperglycemia, unspecified: Secondary | ICD-10-CM

## 2011-10-14 DIAGNOSIS — R7309 Other abnormal glucose: Secondary | ICD-10-CM

## 2011-10-14 LAB — COMPREHENSIVE METABOLIC PANEL
ALT: 16 U/L (ref 0–35)
AST: 16 U/L (ref 0–37)
Albumin: 4 g/dL (ref 3.5–5.2)
Alkaline Phosphatase: 101 U/L (ref 39–117)
BUN: 14 mg/dL (ref 6–23)
CO2: 24 mEq/L (ref 19–32)
Calcium: 9.8 mg/dL (ref 8.4–10.5)
Chloride: 104 mEq/L (ref 96–112)
Creat: 0.96 mg/dL (ref 0.50–1.10)
Glucose, Bld: 124 mg/dL — ABNORMAL HIGH (ref 70–99)
Potassium: 4 mEq/L (ref 3.5–5.3)
Sodium: 139 mEq/L (ref 135–145)
Total Bilirubin: 0.4 mg/dL (ref 0.3–1.2)
Total Protein: 8.6 g/dL — ABNORMAL HIGH (ref 6.0–8.3)

## 2011-10-14 LAB — POCT CBC
Granulocyte percent: 78.4 %G (ref 37–80)
HCT, POC: 38.3 % (ref 37.7–47.9)
Hemoglobin: 12.1 g/dL — AB (ref 12.2–16.2)
Lymph, poc: 2 (ref 0.6–3.4)
MCH, POC: 24 pg — AB (ref 27–31.2)
MCHC: 31.6 g/dL — AB (ref 31.8–35.4)
MCV: 75.9 fL — AB (ref 80–97)
MID (cbc): 0.5 (ref 0–0.9)
MPV: 9.1 fL (ref 0–99.8)
POC Granulocyte: 9.2 — AB (ref 2–6.9)
POC LYMPH PERCENT: 17.4 %L (ref 10–50)
POC MID %: 4.2 %M (ref 0–12)
Platelet Count, POC: 416 10*3/uL (ref 142–424)
RBC: 5.05 M/uL (ref 4.04–5.48)
RDW, POC: 15.9 %
WBC: 11.7 10*3/uL — AB (ref 4.6–10.2)

## 2011-10-14 LAB — POCT SEDIMENTATION RATE: POCT SED RATE: 112 mm/hr — AB (ref 0–22)

## 2011-10-14 LAB — POCT GLYCOSYLATED HEMOGLOBIN (HGB A1C): Hemoglobin A1C: 6.3

## 2011-10-14 LAB — URIC ACID: Uric Acid, Serum: 8.1 mg/dL — ABNORMAL HIGH (ref 2.4–7.0)

## 2011-10-14 LAB — TSH: TSH: 2.584 u[IU]/mL (ref 0.350–4.500)

## 2011-10-14 LAB — GLUCOSE, POCT (MANUAL RESULT ENTRY): POC Glucose: 128 mg/dl — AB (ref 70–99)

## 2011-10-14 MED ORDER — PREDNISONE 20 MG PO TABS: 40.0000 mg | ORAL_TABLET | Freq: Every day | ORAL | Status: AC

## 2011-10-14 MED ORDER — FUROSEMIDE 40 MG PO TABS: 40.0000 mg | ORAL_TABLET | Freq: Every day | ORAL | Status: DC

## 2011-10-14 NOTE — Telephone Encounter (Signed)
It appears that she was asked by her primary MD to come in to their clinic on 10/09/11 when she made the request to them as well.  She needs to do that.

## 2011-10-14 NOTE — Telephone Encounter (Signed)
PT IS HERE TO BE SEEN

## 2011-10-14 NOTE — Progress Notes (Signed)
HPI  51 yo female here with right foot swelling. Thinks she Stubbed it about 5 weeks ago (vague memory of). Several days later after pain, started to swell. Hurts to walk on it. Some pain with touching. Red. No known history of gout.  Review of Systems  Negative except as per HPI  She improved with the prednisone, but swelling has returned in not only right but also left foot.  Complains of swelling and burning pain. She changed her blood pressure medicine to plain lisinopril  Objective:   Physical Exam  Morbidly obese  NAD  Right first MTP swollen, erythematous, moderately tender to palpation.  Entire right foot is swollen with edema involving lower right leg [facility] Primary radiology reading by Dr. Georgiana Shore:  No fracture or dislocation  No calf pain or tenderness, no calf erythema  Results for orders placed in visit on 09/27/11  COMPREHENSIVE METABOLIC PANEL      Component Value Range   Sodium 135  135 - 145 mEq/L   Potassium 3.9  3.5 - 5.3 mEq/L   Chloride 99  96 - 112 mEq/L   CO2 24  19 - 32 mEq/L   Glucose, Bld 120 (*) 70 - 99 mg/dL   BUN 10  6 - 23 mg/dL   Creat 4.13  2.44 - 0.10 mg/dL   Total Bilirubin 1.0  0.3 - 1.2 mg/dL   Alkaline Phosphatase 97  39 - 117 U/L   AST 14  0 - 37 U/L   ALT 17  0 - 35 U/L   Total Protein 8.1  6.0 - 8.3 g/dL   Albumin 3.9  3.5 - 5.2 g/dL   Calcium 9.4  8.4 - 27.2 mg/dL  URIC ACID      Component Value Range   Uric Acid, Serum 9.6 (*) 2.4 - 7.0 mg/dL   Results for orders placed in visit on 10/14/11  POCT CBC      Component Value Range   WBC 11.7 (*) 4.6 - 10.2 K/uL   Lymph, poc 2.0  0.6 - 3.4   POC LYMPH PERCENT 17.4  10 - 50 %L   MID (cbc) 0.5  0 - 0.9   POC MID % 4.2  0 - 12 %M   POC Granulocyte 9.2 (*) 2 - 6.9   Granulocyte percent 78.4  37 - 80 %G   RBC 5.05  4.04 - 5.48 M/uL   Hemoglobin 12.1 (*) 12.2 - 16.2 g/dL   HCT, POC 53.6  64.4 - 47.9 %   MCV 75.9 (*) 80 - 97 fL   MCH, POC 24.0 (*) 27 - 31.2 pg   MCHC 31.6 (*) 31.8 - 35.4  g/dL   RDW, POC 03.4     Platelet Count, POC 416  142 - 424 K/uL   MPV 9.1  0 - 99.8 fL  POCT GLYCOSYLATED HEMOGLOBIN (HGB A1C)      Component Value Range   Hemoglobin A1C 6.3    GLUCOSE, POCT (MANUAL RESULT ENTRY)      Component Value Range   POC Glucose 128 (*) 70 - 99 mg/dl     Assessment & Plan:   Marked edema with tenderness No diabetes, recent glucoses okay. Prednisone taper given duration of symptoms.    1. Foot pain, right  POCT CBC, POCT SEDIMENTATION RATE, Comprehensive metabolic panel, TSH, Uric Acid, furosemide (LASIX) 40 MG tablet, predniSONE (DELTASONE) 20 MG tablet  2. Edema  Comprehensive metabolic panel, TSH, Uric Acid, furosemide (LASIX) 40 MG  tablet, predniSONE (DELTASONE) 20 MG tablet  3. Hyperglycemia  POCT glycosylated hemoglobin (Hb A1C), POCT glucose (manual entry)   Recheck in 3 days

## 2011-10-21 ENCOUNTER — Ambulatory Visit (INDEPENDENT_AMBULATORY_CARE_PROVIDER_SITE_OTHER): Payer: Medicare Other | Admitting: Family Medicine

## 2011-10-21 VITALS — BP 107/73 | HR 96 | Temp 98.1°F | Resp 20 | Ht 63.0 in | Wt 320.8 lb

## 2011-10-21 DIAGNOSIS — R600 Localized edema: Secondary | ICD-10-CM

## 2011-10-21 DIAGNOSIS — R609 Edema, unspecified: Secondary | ICD-10-CM

## 2011-10-21 DIAGNOSIS — I10 Essential (primary) hypertension: Secondary | ICD-10-CM

## 2011-10-21 NOTE — Progress Notes (Signed)
HPI  51 yo female here with right foot swelling. Thinks she Stubbed it about 6 weeks ago (vague memory of). Patient has been losing weight and taking care of her diet. We discussed her diet in great detail today. Her knees feel better and the swelling is down.  Negative except as per HPI    Objective: Patient very encouraged today with her weight loss, her lack of significant edema, and her decreasing knee pain.  Spent 15 minutes with patient encouraging her to continue to lose weight.  Assessment: Good start to weight loss  Plan: Recheck in 1 month, Lasix every other day now.

## 2012-04-10 ENCOUNTER — Other Ambulatory Visit: Payer: Self-pay | Admitting: Family Medicine

## 2012-04-10 NOTE — Telephone Encounter (Signed)
Needs office visit.

## 2012-05-06 ENCOUNTER — Other Ambulatory Visit: Payer: Self-pay | Admitting: Family Medicine

## 2012-05-06 DIAGNOSIS — Z1231 Encounter for screening mammogram for malignant neoplasm of breast: Secondary | ICD-10-CM

## 2012-05-17 ENCOUNTER — Ambulatory Visit (HOSPITAL_COMMUNITY)
Admission: RE | Admit: 2012-05-17 | Discharge: 2012-05-17 | Disposition: A | Discharge status: Home / Self Care | Payer: Medicare Other | Source: Ambulatory Visit | Attending: Family Medicine | Admitting: Family Medicine

## 2012-05-17 DIAGNOSIS — Z1231 Encounter for screening mammogram for malignant neoplasm of breast: Secondary | ICD-10-CM | POA: Insufficient documentation

## 2012-07-01 ENCOUNTER — Telehealth: Payer: Self-pay

## 2012-07-01 NOTE — Telephone Encounter (Signed)
Spoke with Almira Coaster. She will refax the form to [phone].

## 2012-07-01 NOTE — Telephone Encounter (Signed)
[facility] wants to know if we got the fax for the knee brace, if not call [phone] ext [phone]  Attn Gena

## 2012-07-03 ENCOUNTER — Ambulatory Visit: Payer: Medicare Other | Admitting: Family Medicine

## 2012-07-08 ENCOUNTER — Telehealth: Payer: Self-pay | Admitting: Radiology

## 2012-07-08 NOTE — Telephone Encounter (Signed)
Pt has medical supply company calling for knee brace but patient has not been evaluated for this, company advsied we would be happy to see patient to see if she qualifies for a knee brace.

## 2012-07-15 ENCOUNTER — Telehealth: Payer: Self-pay | Admitting: Radiology

## 2012-07-15 ENCOUNTER — Other Ambulatory Visit: Payer: Self-pay

## 2012-07-15 MED ORDER — FUROSEMIDE 40 MG PO TABS: 40.0000 mg | ORAL_TABLET | Freq: Every day | ORAL | Status: DC

## 2012-07-15 NOTE — Telephone Encounter (Signed)
[phone] patient called states someone called her, I did not.

## 2012-08-09 ENCOUNTER — Ambulatory Visit (INDEPENDENT_AMBULATORY_CARE_PROVIDER_SITE_OTHER): Payer: Medicare Other | Admitting: Family Medicine

## 2012-08-09 ENCOUNTER — Ambulatory Visit: Payer: Medicare Other

## 2012-08-09 VITALS — BP 146/90 | HR 72 | Temp 98.7°F | Resp 16 | Ht 63.25 in | Wt 328.8 lb

## 2012-08-09 DIAGNOSIS — I878 Other specified disorders of veins: Secondary | ICD-10-CM | POA: Insufficient documentation

## 2012-08-09 DIAGNOSIS — M542 Cervicalgia: Secondary | ICD-10-CM

## 2012-08-09 DIAGNOSIS — I872 Venous insufficiency (chronic) (peripheral): Secondary | ICD-10-CM

## 2012-08-09 DIAGNOSIS — F329 Major depressive disorder, single episode, unspecified: Secondary | ICD-10-CM

## 2012-08-09 DIAGNOSIS — I10 Essential (primary) hypertension: Secondary | ICD-10-CM

## 2012-08-09 DIAGNOSIS — M109 Gout, unspecified: Secondary | ICD-10-CM

## 2012-08-09 DIAGNOSIS — F3289 Other specified depressive episodes: Secondary | ICD-10-CM

## 2012-08-09 LAB — POCT URINALYSIS DIPSTICK
Blood, UA: NEGATIVE
Glucose, UA: NEGATIVE
Nitrite, UA: NEGATIVE
Urobilinogen, UA: 0.2

## 2012-08-09 LAB — POCT CBC
Granulocyte percent: 72 %G (ref 37–80)
MID (cbc): 0.8 (ref 0–0.9)
MPV: 9.4 fL (ref 0–99.8)
POC MID %: 6.7 %M (ref 0–12)
Platelet Count, POC: 326 10*3/uL (ref 142–424)
RBC: 5.54 M/uL — AB (ref 4.04–5.48)

## 2012-08-09 LAB — COMPREHENSIVE METABOLIC PANEL
AST: 16 U/L (ref 0–37)
Albumin: 4.4 g/dL (ref 3.5–5.2)
Alkaline Phosphatase: 123 U/L — ABNORMAL HIGH (ref 39–117)
BUN: 23 mg/dL (ref 6–23)
Potassium: 4 mEq/L (ref 3.5–5.3)
Sodium: 136 mEq/L (ref 135–145)

## 2012-08-09 MED ORDER — PREDNISONE 20 MG PO TABS: ORAL_TABLET | ORAL | Status: DC

## 2012-08-09 MED ORDER — POTASSIUM CHLORIDE 20 MEQ/15ML (10%) PO LIQD: 20.0000 meq | Freq: Every day | ORAL | Status: DC

## 2012-08-09 MED ORDER — LISINOPRIL 10 MG PO TABS: 10.0000 mg | ORAL_TABLET | Freq: Every day | ORAL | Status: DC

## 2012-08-09 MED ORDER — ESCITALOPRAM OXALATE 10 MG PO TABS: 10.0000 mg | ORAL_TABLET | Freq: Every day | ORAL | Status: DC

## 2012-08-09 MED ORDER — FUROSEMIDE 40 MG PO TABS: 40.0000 mg | ORAL_TABLET | Freq: Every day | ORAL | Status: DC

## 2012-08-09 NOTE — Progress Notes (Signed)
[address]   [place], [place]  [address]   [phone]  Subjective:    Patient ID: Jamie Salazar, female    DOB: 1960/09/14, 52 y.o.   MRN: [id number]  HPI This 52 y.o. female presents for evaluation of the following:    1.  Venous Insufficiency:  Needs refill on Lasix; refused refill until evaluation.  2.  Neck pain:  Onset 2 days.  Sleeps on stomach; neck pain waking up at night.  No injury.  Pain with range of motion; thinks likely DDD.   No radiation into arms.  No n/t in arms.  +numbness in L lateral leg chronic; no history of known back pain; duration second episode; duration several years; intermittent; rare episode.  +weakness in leg with numbness.  No saddle paresthesias.  No weakness in legs.  Normal b/b function.  Taking OTC medication without improvement; not sure what medication she is taking. Cannot take NSAIDs due to Lisinopril.    3.  HTN:  Does not check blood pressure at home.  Reports compliance with medication; good tolerance to medication; good symptom control.  Denies CP/palp/SOB/cough/diaphoresis.   Chronic LE swelling.  4. Depression/anxiety:  No medication currently but continues to suffer with depressive symptoms; received disability due to depression.  Previous Zoloft, Celexa without good results; afraid of side effects to medications.  5. Gout: thinks suffering with mild gouty attack in B feet; previous Prednisone worked very well.  Sugar worsens gout; has been eating more sugar lately.   Review of Systems  Constitutional: Negative for fever, chills, diaphoresis and fatigue.  Respiratory: Negative for shortness of breath, wheezing and stridor.   Cardiovascular: Positive for leg swelling. Negative for chest pain and palpitations.  Gastrointestinal: Positive for constipation.  Endocrine: Negative for cold intolerance, heat intolerance, polydipsia, polyphagia and polyuria.  Musculoskeletal: Positive for myalgias, back pain and arthralgias. Negative for joint  swelling.  Skin: Negative for rash and wound.  Neurological: Positive for numbness. Negative for dizziness, tremors, syncope, facial asymmetry, speech difficulty, weakness, light-headedness and headaches.  Psychiatric/Behavioral: Positive for dysphoric mood. Negative for suicidal ideas and self-injury. The patient is nervous/anxious.         Past Medical History  Diagnosis Date  . Arthritis   . Anxiety   . Depression   . Allergy     Zyrtec daily.  . Hypertension     Past Surgical History  Procedure Laterality Date  . Gallbladder    . Cholecystectomy      Prior to Admission medications   Medication Sig Start Date End Date Taking? Authorizing Provider  Acetaminophen-Caffeine (EXCEDRIN TENSION HEADACHE) 500-65 MG TABS     Yes Historical Provider, MD  cetirizine (ZYRTEC HIVES RELIEF) 10 MG tablet     Yes Historical Provider, MD  furosemide (LASIX) 40 MG tablet Take 1 tablet (40 mg total) by mouth daily. 07/15/12  Yes Godfrey Pick, PA-C  glucosamine-chondroitin 500-400 MG tablet Take 1 tablet by mouth 3 (three) times daily.   Yes Historical Provider, MD  lisinopril (PRINIVIL,ZESTRIL) 10 MG tablet Take 1 tablet (10 mg total) by mouth daily. 09/29/11 09/28/12 Yes Reginold Agent, MD  potassium chloride 20 MEQ/15ML (10%) solution Take 15 mLs (20 mEq total) by mouth daily. 09/27/11  Yes Reginold Agent, MD    No Known Allergies  History   Social History  . Marital Status: Single    Spouse Name: N/A    Number of Children: N/A  . Years of Education: N/A  Occupational History  . Not on file.   Social History Main Topics  . Smoking status: Never Smoker   . Smokeless tobacco: Not on file  . Alcohol Use: Not on file  . Drug Use: Not on file  . Sexually Active: Not on file   Other Topics Concern  . Not on file   Social History Narrative   Marital status: single; not dating and not interested.      Children: none      Lives: alone      Employment: disabled due to OA,  depression/anxiety      Tobacco:            Family History  Problem Relation Age of Onset  . Lupus Mother   . Diabetes Mother   . Hypertension Mother   . Heart disease Mother     stents placed 72.    Objective:   Physical Exam  Nursing note and vitals reviewed. Constitutional: She is oriented to person, place, and time. She appears well-developed and well-nourished. No distress.  obese  HENT:  Head: Normocephalic and atraumatic.  Mouth/Throat: Oropharynx is clear and moist.  Eyes: Conjunctivae and EOM are normal. Pupils are equal, round, and reactive to light.  Neck: Normal range of motion. Neck supple. No thyromegaly present.  Cardiovascular: Normal rate, regular rhythm, normal heart sounds and intact distal pulses.  Exam reveals no gallop and no friction rub.   No murmur heard. Pulmonary/Chest: Effort normal and breath sounds normal. She has no wheezes. She has no rales.  Musculoskeletal:       Right shoulder: Normal.       Left shoulder: Normal.       Cervical back: She exhibits decreased range of motion, tenderness, pain and spasm. She exhibits no bony tenderness, no swelling and normal pulse.       Right foot: Normal.       Left foot: Normal.  Cervical spine: +pain with rotation side to side and lateral bending; normal flexion/extension; motor 5/5 BUE.    Lymphadenopathy:    She has no cervical adenopathy.  Neurological: She is alert and oriented to person, place, and time.  Skin: Skin is warm and dry. She is not diaphoretic.  Diffuse calluses and skin thickening B feet.  Psychiatric: She has a normal mood and affect. Her behavior is normal. Judgment and thought content normal.    Results for orders placed in visit on 08/09/12  POCT CBC      Result Value Range   WBC 12.0 (*) 4.6 - 10.2 K/uL   Lymph, poc 2.6  0.6 - 3.4   POC LYMPH PERCENT 21.3  10 - 50 %L   MID (cbc) 0.8  0 - 0.9   POC MID % 6.7  0 - 12 %M   POC Granulocyte 8.6 (*) 2 - 6.9   Granulocyte percent  72.0  37 - 80 %G   RBC 5.54 (*) 4.04 - 5.48 M/uL   Hemoglobin 13.4  12.2 - 16.2 g/dL   HCT, POC 84.6  96.2 - 47.9 %   MCV 78.0 (*) 80 - 97 fL   MCH, POC 24.2 (*) 27 - 31.2 pg   MCHC 31.0 (*) 31.8 - 35.4 g/dL   RDW, POC 95.2     Platelet Count, POC 326  142 - 424 K/uL   MPV 9.4  0 - 99.8 fL  POCT URINALYSIS DIPSTICK      Result Value Range   Color, UA light  yellow     Clarity, UA clear     Glucose, UA neg     Bilirubin, UA neg     Ketones, UA neg     Spec Grav, UA 1.010     Blood, UA neg     pH, UA 5.0     Protein, UA neg     Urobilinogen, UA 0.2     Nitrite, UA neg     Leukocytes, UA Negative     [facility] reading (PRIMARY) by  Dr. Katrinka Blazing.  C-SPINE:  MULTILEVEL DDD; +SPURRING.      Assessment & Plan:  Essential hypertension, benign - Plan: POCT CBC, POCT urinalysis dipstick, Comprehensive metabolic panel, Uric acid  Depression  Gout flare - Plan: Uric acid  Neck pain - Plan: DG Cervical Spine 2 or 3 views  Venous stasis   1.  [id number] controlled; no change in medication; obtain labs. 2.  Venous Stasis: stable; refill of Lasix and KCL provided; obtain labs. 3.  Depression with anxiety:  Uncontrolled; recommend trial of Lexapro 10mg  qhs.   4.  Neck pain/strain:  New.  With DDD cervical spine; rx for Prednisone provided; recommend heat, range of motion exercises.  Call if no improvement in two weeks for ortho referral. 5.  Gout:  With mild exacerbation B feet; rx for Prednisone provided; dietary modification.  Meds ordered this encounter  Medications  . glucosamine-chondroitin 500-400 MG tablet    Sig: Take 1 tablet by mouth 3 (three) times daily.  . potassium chloride 20 MEQ/15ML (10%) solution    Sig: Take 15 mLs (20 mEq total) by mouth daily.    Dispense:  480 mL    Refill:  5  . lisinopril (PRINIVIL,ZESTRIL) 10 MG tablet    Sig: Take 1 tablet (10 mg total) by mouth daily.    Dispense:  30 tablet    Refill:  5  . furosemide (LASIX) 40 MG tablet    Sig: Take  1 tablet (40 mg total) by mouth daily.    Dispense:  30 tablet    Refill:  5  . predniSONE (DELTASONE) 20 MG tablet    Sig: Two tablets daily x 5 days, then one tablet daily x 5 days    Dispense:  15 tablet    Refill:  0  . escitalopram (LEXAPRO) 10 MG tablet    Sig: Take 1 tablet (10 mg total) by mouth daily.    Dispense:  30 tablet    Refill:  5

## 2012-08-12 NOTE — Progress Notes (Signed)
Left msg for pt to schedule CPE with Dr. Milus Glazier in 6 months.

## 2012-08-19 NOTE — Progress Notes (Signed)
Per results note from Dr. Katrinka Blazing, scheduled 2 month f-up appt with Dr. Elbert Ewings.

## 2012-10-03 ENCOUNTER — Ambulatory Visit (INDEPENDENT_AMBULATORY_CARE_PROVIDER_SITE_OTHER): Payer: Medicare Other | Admitting: Family Medicine

## 2012-10-03 ENCOUNTER — Encounter: Payer: Self-pay | Admitting: Family Medicine

## 2012-10-03 VITALS — BP 141/86 | HR 83 | Temp 98.6°F | Resp 18 | Ht 63.5 in | Wt 336.0 lb

## 2012-10-03 DIAGNOSIS — R609 Edema, unspecified: Secondary | ICD-10-CM

## 2012-10-03 DIAGNOSIS — R21 Rash and other nonspecific skin eruption: Secondary | ICD-10-CM

## 2012-10-03 DIAGNOSIS — Z0000 Encounter for general adult medical examination without abnormal findings: Secondary | ICD-10-CM

## 2012-10-03 DIAGNOSIS — I10 Essential (primary) hypertension: Secondary | ICD-10-CM

## 2012-10-03 DIAGNOSIS — R358 Other polyuria: Secondary | ICD-10-CM

## 2012-10-03 DIAGNOSIS — R3589 Other polyuria: Secondary | ICD-10-CM

## 2012-10-03 LAB — POCT UA - MICROSCOPIC ONLY
Casts, Ur, LPF, POC: NEGATIVE
Crystals, Ur, HPF, POC: NEGATIVE
Yeast, UA: NEGATIVE

## 2012-10-03 LAB — POCT URINALYSIS DIPSTICK
Bilirubin, UA: NEGATIVE
Blood, UA: NEGATIVE
Glucose, UA: NEGATIVE
Ketones, UA: NEGATIVE
Leukocytes, UA: NEGATIVE
Nitrite, UA: NEGATIVE
Protein, UA: NEGATIVE
Spec Grav, UA: 1.01
Urobilinogen, UA: 0.2
pH, UA: 5.5

## 2012-10-03 LAB — COMPREHENSIVE METABOLIC PANEL
ALT: 22 U/L (ref 0–35)
AST: 19 U/L (ref 0–37)
Albumin: 4.3 g/dL (ref 3.5–5.2)
Alkaline Phosphatase: 111 U/L (ref 39–117)
BUN: 23 mg/dL (ref 6–23)
CO2: 26 mEq/L (ref 19–32)
Calcium: 10.2 mg/dL (ref 8.4–10.5)
Chloride: 100 mEq/L (ref 96–112)
Creat: 1.14 mg/dL — ABNORMAL HIGH (ref 0.50–1.10)
Glucose, Bld: 96 mg/dL (ref 70–99)
Potassium: 4.4 mEq/L (ref 3.5–5.3)
Sodium: 139 mEq/L (ref 135–145)
Total Bilirubin: 0.4 mg/dL (ref 0.3–1.2)
Total Protein: 8.8 g/dL — ABNORMAL HIGH (ref 6.0–8.3)

## 2012-10-03 LAB — CBC WITH DIFFERENTIAL/PLATELET
Basophils Absolute: 0 10*3/uL (ref 0.0–0.1)
Basophils Relative: 0 % (ref 0–1)
Eosinophils Absolute: 0.7 10*3/uL (ref 0.0–0.7)
Eosinophils Relative: 8 % — ABNORMAL HIGH (ref 0–5)
HCT: 40.6 % (ref 36.0–46.0)
Hemoglobin: 13.4 g/dL (ref 12.0–15.0)
Lymphocytes Relative: 21 % (ref 12–46)
Lymphs Abs: 1.9 10*3/uL (ref 0.7–4.0)
MCH: 24.1 pg — ABNORMAL LOW (ref 26.0–34.0)
MCHC: 33 g/dL (ref 30.0–36.0)
MCV: 73 fL — ABNORMAL LOW (ref 78.0–100.0)
Monocytes Absolute: 0.5 10*3/uL (ref 0.1–1.0)
Monocytes Relative: 6 % (ref 3–12)
Neutro Abs: 6 10*3/uL (ref 1.7–7.7)
Neutrophils Relative %: 65 % (ref 43–77)
Platelets: 283 10*3/uL (ref 150–400)
RBC: 5.56 MIL/uL — ABNORMAL HIGH (ref 3.87–5.11)
RDW: 16 % — ABNORMAL HIGH (ref 11.5–15.5)
WBC: 9.1 10*3/uL (ref 4.0–10.5)

## 2012-10-03 LAB — URIC ACID: Uric Acid, Serum: 8.5 mg/dL — ABNORMAL HIGH (ref 2.4–7.0)

## 2012-10-03 LAB — POCT SEDIMENTATION RATE: POCT SED RATE: 49 mm/hr — AB (ref 0–22)

## 2012-10-03 MED ORDER — TRIAMCINOLONE ACETONIDE 0.1 % EX CREA: TOPICAL_CREAM | Freq: Two times a day (BID) | CUTANEOUS | Status: DC

## 2012-10-03 NOTE — Addendum Note (Signed)
Addended by: Elvina Sidle on: 10/03/2012 10:41 AM   Modules accepted: Orders

## 2012-10-03 NOTE — Progress Notes (Signed)
52 yo woman applying for continued disability benefits.  She is morbidly obese and cannot walk more than 50 yards without stopping to rest.  She has dry itchy skin diffusely.  She has mild edema, varicose veins.  No chest pain or shortness of breath. Some numbness left fascia lata area with paresthesias  Objective:  Morbidly obese alert woman in NAD Chest:  Dry scaly skin on torso and lower extremities Heart:  Regular, no murmur Ext:  1+ pedal edema, varicosities.  Good dorsalis pedis pulses.  No heberden's nodes or focal joint swelling Forms filled out Results for orders placed in visit on 10/03/12  POCT URINALYSIS DIPSTICK      Result Value Range   Color, UA yellow     Clarity, UA clear     Glucose, UA neg     Bilirubin, UA neg     Ketones, UA neg     Spec Grav, UA 1.010     Blood, UA neg     pH, UA 5.5     Protein, UA neg     Urobilinogen, UA 0.2     Nitrite, UA neg     Leukocytes, UA Negative    POCT UA - MICROSCOPIC ONLY      Result Value Range   WBC, Ur, HPF, POC 0-2     RBC, urine, microscopic 0-4     Bacteria, U Microscopic small     Mucus, UA trace     Epithelial cells, urine per micros 0-8     Crystals, Ur, HPF, POC neg     Casts, Ur, LPF, POC neg     Yeast, UA neg      Assessment:  Morbidly obese woman who really cannot work because of her size and chronic joint pain.  Patient is very needy, and does not want to consider lap band surgery. She really has an inadequate personality to lose weight or work.  Plan:  I helped patient fill out her forms. I urged patient to lose weight.   Hypertension - Plan: Comprehensive metabolic panel, POCT CBC, POCT SEDIMENTATION RATE, POCT urinalysis dipstick, POCT UA - Microscopic Only  Polyuria - Plan: Comprehensive metabolic panel, POCT CBC, POCT SEDIMENTATION RATE, POCT urinalysis dipstick, POCT UA - Microscopic Only  Morbid obesity - Plan: Comprehensive metabolic panel, POCT CBC, POCT SEDIMENTATION RATE, POCT urinalysis  dipstick, POCT UA - Microscopic Only  Rash and nonspecific skin eruption - Plan: triamcinolone cream (KENALOG) 0.1 % CPE in July Patient to lose 6 lbs in one month.  No sodas or sweet tea.  Elvina Sidle, MD

## 2012-10-05 ENCOUNTER — Telehealth: Payer: Self-pay

## 2012-10-05 NOTE — Telephone Encounter (Signed)
Pt is returning someones call  Call back number [phone]

## 2012-10-05 NOTE — Telephone Encounter (Signed)
LM for return call- lab results

## 2012-10-07 NOTE — Telephone Encounter (Signed)
See labs 

## 2012-11-21 ENCOUNTER — Encounter: Payer: Medicare Other | Admitting: Family Medicine

## 2012-12-05 ENCOUNTER — Encounter: Payer: Medicare Other | Admitting: Family Medicine

## 2013-03-23 ENCOUNTER — Other Ambulatory Visit: Payer: Self-pay | Admitting: Family Medicine

## 2013-04-20 ENCOUNTER — Other Ambulatory Visit: Payer: Self-pay | Admitting: Family Medicine

## 2013-05-18 ENCOUNTER — Other Ambulatory Visit: Payer: Self-pay | Admitting: Physician Assistant

## 2013-06-06 ENCOUNTER — Ambulatory Visit (INDEPENDENT_AMBULATORY_CARE_PROVIDER_SITE_OTHER): Payer: Medicare Other | Admitting: Family Medicine

## 2013-06-06 ENCOUNTER — Encounter: Payer: Self-pay | Admitting: Family Medicine

## 2013-06-06 VITALS — BP 131/89 | HR 95 | Temp 98.1°F | Resp 16 | Ht 63.5 in | Wt 330.0 lb

## 2013-06-06 DIAGNOSIS — I10 Essential (primary) hypertension: Secondary | ICD-10-CM

## 2013-06-06 DIAGNOSIS — I878 Other specified disorders of veins: Secondary | ICD-10-CM

## 2013-06-06 DIAGNOSIS — I872 Venous insufficiency (chronic) (peripheral): Secondary | ICD-10-CM

## 2013-06-06 MED ORDER — POTASSIUM CHLORIDE 20 MEQ/15ML (10%) PO LIQD: 20.0000 meq | Freq: Every day | ORAL | Status: DC

## 2013-06-06 MED ORDER — FUROSEMIDE 40 MG PO TABS: ORAL_TABLET | ORAL | Status: DC

## 2013-06-06 MED ORDER — LISINOPRIL 10 MG PO TABS: 10.0000 mg | ORAL_TABLET | Freq: Every day | ORAL | Status: DC

## 2013-06-08 NOTE — Progress Notes (Signed)
S:  This 53 y.o. AA female is here for HTN follow-up, last visit in June 2014 at which time she had med refills and physician completed SSI forms. She was encouraged to focus on weight reduction and has managed to lose 6 lbs. She requests medication refills today, SHe denies diaphoresis, abnormal weight changes, CP or tightness, SOB or DOE, cough, myalgias, HA, dizziness, numbness, slurred speech, weakness or syncope.She takes diuretic for chronic LEE.   Patient Active Problem List   Diagnosis Date Noted  . Venous stasis 08/09/2012  . Gout of big toe 09/27/2011  . Varicose veins 04/28/2011  . Mild hyperparathyroidism 02/22/2011  . UNSPECIFIED EPISODIC MOOD DISORDER 07/15/2009  . MORBID OBESITY 05/20/2009  . DEPRESSION/ANXIETY 05/20/2009  . ESSENTIAL HYPERTENSION 05/20/2009  . ALLERGIC RHINITIS 05/20/2009  . CONSTIPATION, CHRONIC 05/20/2009  . 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  MEDICATIONS reconciled.  ROS: As per HPI.  O: Filed Vitals:   06/06/13 0959  BP: 131/89  Pulse: 95  Temp: 98.1 F (36.7 C)  Resp: 16   GEN: In NAD: WN,WD. Pt is obese. HENT: [place]/AT; EOMI w/ clear conj/sclerae. Otherwise unremarkable. COR: RRR. Trace pretibial edema. LUNGS: CTA; unlabored resp. SKIN: W&D; intact w/o diaphoresis, erythema or jaundice. MS: MAEs; no gross deformities or muscle atrophy. NEURO: A&O x 3; CNs intact. Nonfocal.  A/P: Hypertension- Continue current medications. Encouraged weight loss.  Venous stasis - Plan: potassium chloride 20 MEQ/15ML (10%) solution  Morbid obesity  Meds ordered this encounter  Medications  . furosemide (LASIX) 40 MG tablet    Sig: TAKE ONE TABLET BY MOUTH ONCE DAILY    Dispense:  30 tablet    Refill:  5  . lisinopril (PRINIVIL,ZESTRIL) 10 MG tablet    Sig: Take 1 tablet (10 mg total) by mouth daily.    Dispense:  30 tablet    Refill:  5  . potassium chloride 20 MEQ/15ML (10%) solution    Sig:  Take 15 mLs (20 mEq total) by mouth daily.    Dispense:  480 mL    Refill:  5

## 2013-07-24 ENCOUNTER — Encounter: Payer: Medicare Other | Admitting: Family Medicine

## 2013-09-20 ENCOUNTER — Ambulatory Visit: Payer: Commercial Managed Care - HMO | Admitting: Family Medicine

## 2013-09-20 VITALS — BP 132/84 | HR 90 | Temp 98.6°F | Resp 18 | Ht 63.25 in | Wt 339.6 lb

## 2013-09-20 DIAGNOSIS — M25579 Pain in unspecified ankle and joints of unspecified foot: Secondary | ICD-10-CM

## 2013-09-20 DIAGNOSIS — R208 Other disturbances of skin sensation: Secondary | ICD-10-CM

## 2013-09-20 DIAGNOSIS — R209 Unspecified disturbances of skin sensation: Secondary | ICD-10-CM

## 2013-09-20 DIAGNOSIS — M109 Gout, unspecified: Secondary | ICD-10-CM

## 2013-09-20 DIAGNOSIS — I10 Essential (primary) hypertension: Secondary | ICD-10-CM

## 2013-09-20 LAB — POCT CBC
Granulocyte percent: 77.7 % (ref 37–80)
HCT, POC: 42.1 % (ref 37.7–47.9)
Hemoglobin: 13.1 g/dL (ref 12.2–16.2)
Lymph, poc: 1.9 (ref 0.6–3.4)
MCH, POC: 24.3 pg — AB (ref 27–31.2)
MCHC: 31.1 g/dL — AB (ref 31.8–35.4)
MCV: 78.2 fL — AB (ref 80–97)
MID (cbc): 0.6 (ref 0–0.9)
MPV: 9.9 fL (ref 0–99.8)
POC Granulocyte: 8.5 — AB (ref 2–6.9)
POC LYMPH PERCENT: 17.1 % (ref 10–50)
POC MID %: 5.2 %M (ref 0–12)
Platelet Count, POC: 294 10*3/uL (ref 142–424)
RBC: 5.38 M/uL (ref 4.04–5.48)
RDW, POC: 16.2 %
WBC: 10.9 10*3/uL — AB (ref 4.6–10.2)

## 2013-09-20 LAB — COMPREHENSIVE METABOLIC PANEL
AST: 17 U/L (ref 0–37)
Albumin: 4.2 g/dL (ref 3.5–5.2)
BUN: 14 mg/dL (ref 6–23)
CO2: 25 mEq/L (ref 19–32)
Calcium: 9.8 mg/dL (ref 8.4–10.5)
Chloride: 101 mEq/L (ref 96–112)
Creat: 1.01 mg/dL (ref 0.50–1.10)
Glucose, Bld: 98 mg/dL (ref 70–99)
Potassium: 4.3 mEq/L (ref 3.5–5.3)
Total Protein: 8.4 g/dL — ABNORMAL HIGH (ref 6.0–8.3)

## 2013-09-20 LAB — URIC ACID: Uric Acid, Serum: 8 mg/dL — ABNORMAL HIGH (ref 2.4–7.0)

## 2013-09-20 LAB — COMPREHENSIVE METABOLIC PANEL WITH GFR
ALT: 13 U/L (ref 0–35)
Alkaline Phosphatase: 113 U/L (ref 39–117)
Sodium: 135 meq/L (ref 135–145)
Total Bilirubin: 0.6 mg/dL (ref 0.2–1.2)

## 2013-09-20 LAB — POCT GLYCOSYLATED HEMOGLOBIN (HGB A1C): Hemoglobin A1C: 5.5

## 2013-09-20 MED ORDER — PREDNISONE 20 MG PO TABS: ORAL_TABLET | ORAL | Status: DC

## 2013-09-20 NOTE — Progress Notes (Signed)
Chief Complaint:  Chief Complaint  Patient presents with  . Gout    in both foot but the right hurts the most    HPI: Jamie Salazar is a 53 y.o. female who is here for  3 day hustory of bialteral foot pain, worse in right, red and swollen, feels similar to gout. She has trouble ambualting, NKI. She did eat things that she should not have. She has some burning pain in both sides. She denesi DM but last HbA1c was 6.3 1 year ago. Here creatinine fxn was slightly elevated at 1/14 but was stable. She states she takes predniosone for this and helps. She has not done anything at hom fo ir. Scale of 9-10/10 pain with ambualtion.   HTN meds-compliant, no SEs. Just got her BP moniotro   Past Medical History  Diagnosis Date  . Anxiety   . Depression   . Allergy     Zyrtec daily.  . Hypertension   . Arthritis     disability  . Obesity, morbid    Past Surgical History  Procedure Laterality Date  . Gallbladder    . Cholecystectomy     History   Social History  . Marital Status: Single    Spouse Name: N/A    Number of Children: 0  . Years of Education: N/A   Occupational History  . disability     OA, depression   Social History Main Topics  . Smoking status: Never Smoker   . Smokeless tobacco: None  . Alcohol Use: No  . Drug Use: No  . Sexual Activity: No   Other Topics Concern  . None   Social History Narrative   Marital status: single; not dating and not interested.      Children: none      Lives: alone      Employment: disabled due to OA, depression/anxiety      Tobacco:           Family History  Problem Relation Age of Onset  . Lupus Mother   . Diabetes Mother   . Hypertension Mother   . Heart disease Mother     stents placed 35.   No Known Allergies Prior to Admission medications   Medication Sig Start Date End Date Taking? Authorizing Provider  Acetaminophen-Caffeine (EXCEDRIN TENSION HEADACHE) 500-65 MG TABS     Yes Historical Provider, MD    cetirizine (ZYRTEC HIVES RELIEF) 10 MG tablet     Yes Historical Provider, MD  furosemide (LASIX) 40 MG tablet TAKE ONE TABLET BY MOUTH ONCE DAILY 06/06/13  Yes Barton Fanny, MD  glucosamine-chondroitin 500-400 MG tablet Take 1 tablet by mouth 3 (three) times daily.   Yes Historical Provider, MD  lisinopril (PRINIVIL,ZESTRIL) 10 MG tablet Take 1 tablet (10 mg total) by mouth daily. 06/06/13  Yes Barton Fanny, MD  potassium chloride 20 MEQ/15ML (10%) solution Take 15 mLs (20 mEq total) by mouth daily. 06/06/13  Yes Barton Fanny, MD     ROS: The patient denies fevers, chills, night sweats, unintentional weight loss, chest pain, palpitations, wheezing, dyspnea on exertion, nausea, vomiting, abdominal pain, dysuria, hematuria, melena, numbness, weakness,  +tingling.   All other systems have been reviewed and were otherwise negative with the exception of those mentioned in the HPI and as above.    PHYSICAL EXAM: Filed Vitals:   09/20/13 1007  BP: 132/84  Pulse: 90  Temp: 98.6 F (37 C)  Resp: 18  Filed Vitals:   09/20/13 1007  Height: 5' 3.25" (1.607 m)  Weight: 339 lb 9.6 oz (154.042 kg)   Body mass index is 59.65 kg/(m^2).  General: Alert, no acute distress, obese female HEENT:  Normocephalic, atraumatic, oropharynx patent. EOMI, PERRLA Cardiovascular:  Regular rate and rhythm, no rubs murmurs or gallops.  No Carotid bruits, radial pulse intact. No pedal edema.  Respiratory: Clear to auscultation bilaterally.  No wheezes, rales, or rhonchi.  No cyanosis, no use of accessory musculature GI: No organomegaly, abdomen is soft and non-tender, positive bowel sounds.  No masses. Skin: No rashes. Neurologic: Facial musculature symmetric. Psychiatric: Patient is appropriate throughout our interaction. Lymphatic: No cervical lymphadenopathy Musculoskeletal: Gait antalgic. Right foot pain-tender, erythematous , swelling.  Decrease ROM due to pain.  +  DP   LABS: Results for orders placed in visit on 09/20/13  POCT CBC      Result Value Ref Range   WBC 10.9 (*) 4.6 - 10.2 K/uL   Lymph, poc 1.9  0.6 - 3.4   POC LYMPH PERCENT 17.1  10 - 50 %L   MID (cbc) 0.6  0 - 0.9   POC MID % 5.2  0 - 12 %M   POC Granulocyte 8.5 (*) 2 - 6.9   Granulocyte percent 77.7  37 - 80 %G   RBC 5.38  4.04 - 5.48 M/uL   Hemoglobin 13.1  12.2 - 16.2 g/dL   HCT, POC 42.1  37.7 - 47.9 %   MCV 78.2 (*) 80 - 97 fL   MCH, POC 24.3 (*) 27 - 31.2 pg   MCHC 31.1 (*) 31.8 - 35.4 g/dL   RDW, POC 16.2     Platelet Count, POC 294  142 - 424 K/uL   MPV 9.9  0 - 99.8 fL  POCT GLYCOSYLATED HEMOGLOBIN (HGB A1C)      Result Value Ref Range   Hemoglobin A1C 5.5       EKG/XRAY:   Primary read interpreted by Dr. Marin Comment at [facility].   ASSESSMENT/PLAN: Encounter Diagnoses  Name Primary?  . Pain in joint, ankle and foot Yes  . Burning sensation of the foot   . Gout attack   . HTN (hypertension)    Labs pending Will rx prednisone, decline any NSAIDs due to afraid of meds, also decline pain meds F/u prn or for annual Will refill meds prn when she calls in for 6 month for BP meds.   Gross sideeffects, risk and benefits, and alternatives of medications d/w patient. Patient is aware that all medications have potential sideeffects and we are unable to predict every sideeffect or drug-drug interaction that may occur.  Glenford Bayley, DO 09/20/2013 12:49 PM

## 2013-09-20 NOTE — Patient Instructions (Signed)

## 2013-09-22 ENCOUNTER — Telehealth: Payer: Self-pay | Admitting: Family Medicine

## 2013-09-22 ENCOUNTER — Encounter: Payer: Self-pay | Admitting: Family Medicine

## 2013-09-22 NOTE — Telephone Encounter (Signed)
Unable to leave message since VM not set up, will send letter about labs.

## 2013-09-26 ENCOUNTER — Telehealth: Payer: Self-pay

## 2013-09-26 DIAGNOSIS — I878 Other specified disorders of veins: Secondary | ICD-10-CM

## 2013-09-26 NOTE — Telephone Encounter (Signed)
Pt saw Dr.Le this past Saturday, and they discussed possibly refilling her prescription, pt got her lab work back, and decided she does want to have another 6 months of her regular medication.( [facility] [address])

## 2013-09-27 MED ORDER — LISINOPRIL 10 MG PO TABS: 10.0000 mg | ORAL_TABLET | Freq: Every day | ORAL | Status: DC

## 2013-09-27 MED ORDER — POTASSIUM CHLORIDE 20 MEQ/15ML (10%) PO LIQD: 20.0000 meq | Freq: Every day | ORAL | Status: DC

## 2013-09-27 MED ORDER — FUROSEMIDE 40 MG PO TABS: ORAL_TABLET | ORAL | Status: DC

## 2013-09-27 NOTE — Telephone Encounter (Signed)
Left message on machine to call back to see which meds she needs.

## 2013-09-27 NOTE — Telephone Encounter (Signed)
Lisinopril, Lasix, Potassium. RF'ed all for 6 months per Dr. Gus Puma note.

## 2013-10-20 ENCOUNTER — Telehealth: Payer: Self-pay

## 2013-10-20 NOTE — Telephone Encounter (Signed)
The patient called to report that her foot/toe is still burning from her gout flare up.  The patient requests refill of Prednisone rx.  The patient uses the [facility] on [address].  The patient requests return call at [phone]

## 2013-10-21 NOTE — Telephone Encounter (Signed)
Advised pt to RTC. She is going to try some Naproxen and see if that works. She will come in if it is not better.

## 2013-11-27 ENCOUNTER — Ambulatory Visit (INDEPENDENT_AMBULATORY_CARE_PROVIDER_SITE_OTHER): Payer: Commercial Managed Care - HMO | Admitting: Family Medicine

## 2013-11-27 VITALS — BP 126/88 | HR 98 | Temp 98.4°F | Resp 16 | Ht 63.5 in | Wt 336.4 lb

## 2013-11-27 DIAGNOSIS — A5901 Trichomonal vulvovaginitis: Secondary | ICD-10-CM

## 2013-11-27 DIAGNOSIS — A599 Trichomoniasis, unspecified: Secondary | ICD-10-CM

## 2013-11-27 DIAGNOSIS — M109 Gout, unspecified: Secondary | ICD-10-CM

## 2013-11-27 DIAGNOSIS — N898 Other specified noninflammatory disorders of vagina: Secondary | ICD-10-CM

## 2013-11-27 DIAGNOSIS — R300 Dysuria: Secondary | ICD-10-CM

## 2013-11-27 DIAGNOSIS — N390 Urinary tract infection, site not specified: Secondary | ICD-10-CM

## 2013-11-27 LAB — POCT URINALYSIS DIPSTICK
Bilirubin, UA: NEGATIVE
Glucose, UA: NEGATIVE
Ketones, UA: NEGATIVE
Nitrite, UA: NEGATIVE
Protein, UA: NEGATIVE
Spec Grav, UA: <=1.005
Urobilinogen, UA: 0.2
pH, UA: 5

## 2013-11-27 LAB — POCT UA - MICROSCOPIC ONLY
Casts, Ur, LPF, POC: NEGATIVE
Crystals, Ur, HPF, POC: NEGATIVE
Mucus, UA: NEGATIVE
Yeast, UA: NEGATIVE

## 2013-11-27 LAB — POCT WET PREP WITH KOH
Clue Cells Wet Prep HPF POC: NEGATIVE
KOH Prep POC: NEGATIVE
Trichomonas, UA: POSITIVE
Yeast Wet Prep HPF POC: NEGATIVE

## 2013-11-27 MED ORDER — METRONIDAZOLE 500 MG PO TABS: ORAL_TABLET | ORAL | Status: DC

## 2013-11-27 MED ORDER — CIPROFLOXACIN HCL 500 MG PO TABS: 500.0000 mg | ORAL_TABLET | Freq: Two times a day (BID) | ORAL | Status: DC

## 2013-11-27 NOTE — Patient Instructions (Addendum)
Colchicine tablets or capsules What is this medicine? COLCHICINE (KOL chi seen) is for joint pain and swelling due to attacks of acute gouty arthritis. The medicine is also used to treat familial Mediterranean fever. This medicine may be used for other purposes; ask your health care provider or pharmacist if you have questions. COMMON BRAND NAME(S): Colcrys, MITIGARE What should I tell my health care provider before I take this medicine? They need to know if you have any of these conditions: -anemia -blood disorders like leukemia or lymphoma -heart disease -immune system problems -intestinal disease -kidney disease -liver disease -muscle pain or weakness -take other medicines -stomach problems -an unusual or allergic reaction to colchicine, other medicines, lactose, foods, dyes, or preservatives -pregnant or trying to get pregnant -breast-feeding How should I use this medicine? Take this medicine by mouth with a full glass of water. Follow the directions on the prescription label. You can take it with or without food. If it upsets your stomach, take it with food. Take your medicine at regular intervals. Do not take your medicine more often than directed. A special MedGuide will be given to you by the pharmacist with each prescription and refill. Be sure to read this information carefully each time. Talk to your pediatrician regarding the use of this medicine in children. While this drug may be prescribed for children as young as 38 years old for selected conditions, precautions do apply. Patients over 50 years old may have a stronger reaction and need a smaller dose. Overdosage: If you think you have taken too much of this medicine contact a poison control center or emergency room at once. NOTE: This medicine is only for you. Do not share this medicine with others. What if I miss a dose? If you miss a dose, take it as soon as you can. If it is almost time for your next dose, take only that  dose. Do not take double or extra doses. What may interact with this medicine? Do not take this medicine with any of the following medications: -certain medicines for fungal infections like itraconazole This medicine may also interact with the following medications: -alcohol -certain medicines for cholesterol like atorvastatin -certain medicines for coughs and colds -certain medicines to help you breathe better -cyclosporine -digoxin -epinephrine -grapefruit or grapefruit juice -methenamine -other medicines for fungal infection -sodium bicarbonate -some antibiotics like clarithromycin, erythromycin, and telithromycin -some medicines for an irregular heartbeat or other heart problems -some medicines for cancer, like lapatinib and tamoxifen -some medicines for HIV This list may not describe all possible interactions. Give your health care provider a list of all the medicines, herbs, non-prescription drugs, or dietary supplements you use. Also tell them if you smoke, drink alcohol, or use illegal drugs. Some items may interact with your medicine. What should I watch for while using this medicine? Visit your doctor or health care professional for regular checks on your progress. You may need periodic blood checks. Alcohol can increase the chance of getting stomach problems and gout attacks. Do not drink alcohol. What side effects may I notice from receiving this medicine? Side effects that you should report to your doctor or health care professional as soon as possible: -allergic reactions like skin rash, itching or hives, swelling of the face, lips, or tongue -fever, chills, or sore throat -muscle tenderness, pain, or weakness -numbness or tingling in hands or feet -unusual bleeding or bruising -unusually weak or tired -vomiting Side effects that usually do not require medical attention (report to  your doctor or health care professional if they continue or are  bothersome): -diarrhea -hair loss -loss of appetite -stomach pain or nausea This list may not describe all possible side effects. Call your doctor for medical advice about side effects. You may report side effects to FDA at 1-800-FDA-1088. Where should I keep my medicine? Keep out of the reach of children. Store at room temperature between 15 and 30 degrees C (59 and 86 degrees F). Keep container tightly closed. Protect from light. Throw away any unused medicine after the expiration date. NOTE: This sheet is a summary. It may not cover all possible information. If you have questions about this medicine, talk to your doctor, pharmacist, or health care provider.  2015, Elsevier/Gold Standard. (2012-10-07 16:48:38)   Trichomoniasis Trichomoniasis is an infection caused by an organism called Trichomonas. The infection can affect both women and men. In women, the outer female genitalia and the vagina are affected. In men, the penis is mainly affected, but the prostate and other reproductive organs can also be involved. Trichomoniasis is a sexually transmitted infection (STI) and is most often passed to another person through sexual contact.  RISK FACTORS  Having unprotected sexual intercourse.  Having sexual intercourse with an infected partner. SIGNS AND SYMPTOMS  Symptoms of trichomoniasis in women include:  Abnormal gray-green frothy vaginal discharge.  Itching and irritation of the vagina.  Itching and irritation of the area outside the vagina. Symptoms of trichomoniasis in men include:   Penile discharge with or without pain.  Pain during urination. This results from inflammation of the urethra. DIAGNOSIS  Trichomoniasis may be found during a Pap test or physical exam. Your health care provider may use one of the following methods to help diagnose this infection:  Examining vaginal discharge under a microscope. For men, urethral discharge would be examined.  Testing the pH of  the vagina with a test tape.  Using a vaginal swab test that checks for the Trichomonas organism. A test is available that provides results within a few minutes.  Doing a culture test for the organism. This is not usually needed. TREATMENT   You may be given medicine to fight the infection. Women should inform their health care provider if they could be or are pregnant. Some medicines used to treat the infection should not be taken during pregnancy.  Your health care provider may recommend over-the-counter medicines or creams to decrease itching or irritation.  Your sexual partner will need to be treated if infected. HOME CARE INSTRUCTIONS   Take medicines only as directed by your health care provider.  Take over-the-counter medicine for itching or irritation as directed by your health care provider.  Do not have sexual intercourse while you have the infection.  Women should not douche or wear tampons while they have the infection.  Discuss your infection with your partner. Your partner may have gotten the infection from you, or you may have gotten it from your partner.  Have your sex partner get examined and treated if necessary.  Practice safe, informed, and protected sex.  See your health care provider for other STI testing. SEEK MEDICAL CARE IF:   You still have symptoms after you finish your medicine.  You develop abdominal pain.  You have pain when you urinate.  You have bleeding after sexual intercourse.  You develop a rash.  Your medicine makes you sick or makes you throw up (vomit). MAKE SURE YOU:  Understand these instructions.  Will watch your condition.  Will  get help right away if you are not doing well or get worse. Document Released: 10/04/2000 Document Revised: 08/25/2013 Document Reviewed: 01/20/2013 [facility] Patient Information 2015 [place], [place]. This information is not intended to replace advice given to you by your health care provider. Make  sure you discuss any questions you have with your health care provider.

## 2013-11-27 NOTE — Progress Notes (Signed)
Chief Complaint:  Chief Complaint  Patient presents with  . Vaginal Itching    started about 2 weeks--itching--a little burning--did not notice any different in discharge  . Follow-up    wanted to discuss with Dr. Marin Comment regarding testing for gout    HPI: Jamie Salazar is a 53 y.o. female who is here for  1-2 weeks history of vaginal itching and burning sensation with minimal discharge.  She thought it was a yeast infection. HAs not tried anythign for this. She is occasionally sexually active, with them same person, denies any prior STDs . Rare h/o UTIs She is interested in knowing if she has the gout, lat episode she had prednisone and it helped but did not compltely go away and was told to take otc ibuprofen .  She is nto on any chronic gout meds but we have discussed this and I think she should be on it if she is gettign gout so frequently but she is hesitant to get on anything chronic. She is currently taking cherry jucie for her gout.   Past Medical History  Diagnosis Date  . Anxiety   . Depression   . Allergy     Zyrtec daily.  . Hypertension   . Arthritis     disability  . Obesity, morbid    Past Surgical History  Procedure Laterality Date  . Gallbladder    . Cholecystectomy     History   Social History  . Marital Status: Single    Spouse Name: N/A    Number of Children: 0  . Years of Education: N/A   Occupational History  . disability     OA, depression   Social History Main Topics  . Smoking status: Never Smoker   . Smokeless tobacco: None  . Alcohol Use: No  . Drug Use: No  . Sexual Activity: No   Other Topics Concern  . None   Social History Narrative   Marital status: single; not dating and not interested.      Children: none      Lives: alone      Employment: disabled due to OA, depression/anxiety      Tobacco:           Family History  Problem Relation Age of Onset  . Lupus Mother   . Diabetes Mother   . Hypertension Mother     . Heart disease Mother     stents placed 66.   No Known Allergies Prior to Admission medications   Medication Sig Start Date End Date Taking? Authorizing Provider  Acetaminophen-Caffeine (EXCEDRIN TENSION HEADACHE) 500-65 MG TABS     Yes Historical Provider, MD  cetirizine (ZYRTEC HIVES RELIEF) 10 MG tablet     Yes Historical Provider, MD  furosemide (LASIX) 40 MG tablet TAKE ONE TABLET BY MOUTH ONCE DAILY 09/27/13  Yes Gresham Caetano P Nicholad Kautzman, DO  glucosamine-chondroitin 500-400 MG tablet Take 1 tablet by mouth 3 (three) times daily.   Yes Historical Provider, MD  lisinopril (PRINIVIL,ZESTRIL) 10 MG tablet Take 1 tablet (10 mg total) by mouth daily. 09/27/13  Yes Gardy Montanari P Caston Coopersmith, DO  Misc Natural Products (TART CHERRY ADVANCED) CAPS Take 2 capsules by mouth daily.   Yes Historical Provider, MD  potassium chloride 20 MEQ/15ML (10%) solution Take 15 mLs (20 mEq total) by mouth daily. 09/27/13  Yes Teala Daffron P [place] Folden, DO  predniSONE (DELTASONE) 20 MG tablet Take 3 tabs po daily x 3 days, then 2 tabs  daily x 3 days, then 1 tab daily x 3 days, then 1/2 tab daily x 3 days 09/20/13   Lynetta Tomczak P Ledford Goodson, DO     ROS: The patient denies fevers, chills, night sweats, unintentional weight loss, chest pain, palpitations, wheezing, dyspnea on exertion, nausea, vomiting, abdominal pain, dysuria, hematuria, melena, numbness, weakness, or tingling.   All other systems have been reviewed and were otherwise negative with the exception of those mentioned in the HPI and as above.    PHYSICAL EXAM: Filed Vitals:   11/27/13 1748  BP: 126/88  Pulse: 98  Temp: 98.4 F (36.9 C)  Resp: 16   Filed Vitals:   11/27/13 1748  Height: 5' 3.5" (1.613 m)  Weight: 336 lb 6.4 oz (152.59 kg)   Body mass index is 58.65 kg/(m^2).  General: Alert, no acute distress, obese female HEENT:  Normocephalic, atraumatic, oropharynx patent. EOMI, PERRLA Cardiovascular:  Regular rate and rhythm, no rubs murmurs or gallops.  No Carotid bruits, radial pulse intact. No  pedal edema.  Respiratory: Clear to auscultation bilaterally.  No wheezes, rales, or rhonchi.  No cyanosis, no use of accessory musculature GI: No organomegaly, abdomen is soft and non-tender, positive bowel sounds.  No masses. Skin: No rashes. Neurologic: Facial musculature symmetric. Psychiatric: Patient is appropriate throughout our interaction. Lymphatic: No cervical lymphadenopathy Musculoskeletal: Gait intact.    LABS: Results for orders placed in visit on 11/27/13  POCT WET PREP WITH KOH      Result Value Ref Range   Trichomonas, UA Positive     Clue Cells Wet Prep HPF POC neg     Epithelial Wet Prep HPF POC 3-16     Yeast Wet Prep HPF POC neg     Bacteria Wet Prep HPF POC 3+     RBC Wet Prep HPF POC 0-4     WBC Wet Prep HPF POC tntc     KOH Prep POC Negative    POCT UA - MICROSCOPIC ONLY      Result Value Ref Range   WBC, Ur, HPF, POC 2-8     RBC, urine, microscopic 0-4     Bacteria, U Microscopic trace     Mucus, UA neg     Epithelial cells, urine per micros 1-3     Crystals, Ur, HPF, POC neg     Casts, Ur, LPF, POC neg     Yeast, UA neg    POCT URINALYSIS DIPSTICK      Result Value Ref Range   Color, UA yellow     Clarity, UA cloudy     Glucose, UA neg     Bilirubin, UA neg     Ketones, UA neg     Spec Grav, UA <=1.005     Blood, UA trace-lysed     pH, UA 5.0     Protein, UA neg     Urobilinogen, UA 0.2     Nitrite, UA neg     Leukocytes, UA large (3+)       EKG/XRAY:   Primary read interpreted by Dr. Marin Comment at [facility].   ASSESSMENT/PLAN: Encounter Diagnoses  Name Primary?  . Gout without tophus, unspecified cause, unspecified chronicity, unspecified site   . Vaginal discharge   . Dysuria   . Trichomonas vaginalis infection   . Urinary tract infection, site not specified Yes   Rx Cipro for UTI, SEs d/w patient She was treated for trichomoniasis, she would like confirmation testing We will treat her gout accordingly whem=n  results return, i think she  needs to be on either on allopurinol or cochicine regular but will d.w her once we get results.  F/u prn  Gross sideeffects, risk and benefits, and alternatives of medications d/w patient. Patient is aware that all medications have potential sideeffects and we are unable to predict every sideeffect or drug-drug interaction that may occur.  Rema Lievanos, [place], DO 11/27/2013 7:28 PM

## 2013-11-28 LAB — URIC ACID: Uric Acid, Serum: 8.9 mg/dL — ABNORMAL HIGH (ref 2.4–7.0)

## 2013-11-29 LAB — URINE CULTURE: Colony Count: 25000

## 2013-12-02 ENCOUNTER — Telehealth: Payer: Self-pay | Admitting: Family Medicine

## 2013-12-02 LAB — TRICHOMONAS VAGINALIS, PROBE AMP: T vaginalis RNA: POSITIVE — AB

## 2013-12-02 MED ORDER — COLCHICINE 0.6 MG PO TABS: 0.6000 mg | ORAL_TABLET | Freq: Every day | ORAL | Status: DC

## 2013-12-02 NOTE — Telephone Encounter (Signed)
Spoke with patient about labs. Will go ahead and try colcrys once daily. She hates taking pills and has to crush them if too big.

## 2014-01-09 ENCOUNTER — Ambulatory Visit (INDEPENDENT_AMBULATORY_CARE_PROVIDER_SITE_OTHER): Payer: Commercial Managed Care - HMO | Admitting: Family Medicine

## 2014-01-09 ENCOUNTER — Encounter: Payer: Self-pay | Admitting: Family Medicine

## 2014-01-09 VITALS — BP 130/90 | HR 79 | Temp 98.0°F | Resp 18 | Ht 63.0 in | Wt 337.0 lb

## 2014-01-09 DIAGNOSIS — N898 Other specified noninflammatory disorders of vagina: Secondary | ICD-10-CM

## 2014-01-09 DIAGNOSIS — R350 Frequency of micturition: Secondary | ICD-10-CM

## 2014-01-09 DIAGNOSIS — A5901 Trichomonal vulvovaginitis: Secondary | ICD-10-CM

## 2014-01-09 DIAGNOSIS — A599 Trichomoniasis, unspecified: Secondary | ICD-10-CM

## 2014-01-09 DIAGNOSIS — M109 Gout, unspecified: Secondary | ICD-10-CM

## 2014-01-09 LAB — POCT URINALYSIS DIPSTICK
Bilirubin, UA: NEGATIVE
Blood, UA: NEGATIVE
Glucose, UA: NEGATIVE
Ketones, UA: NEGATIVE
Nitrite, UA: NEGATIVE
Spec Grav, UA: 1.01
Urobilinogen, UA: 1
pH, UA: 7

## 2014-01-09 LAB — COMPLETE METABOLIC PANEL WITH GFR
ALT: 22 U/L (ref 0–35)
AST: 22 U/L (ref 0–37)
Albumin: 4.4 g/dL (ref 3.5–5.2)
Alkaline Phosphatase: 113 U/L (ref 39–117)
CO2: 27 mEq/L (ref 19–32)
Calcium: 10.1 mg/dL (ref 8.4–10.5)
Chloride: 99 mEq/L (ref 96–112)
Creat: 1.12 mg/dL — ABNORMAL HIGH (ref 0.50–1.10)
GFR, Est African American: 64 mL/min
Potassium: 4 mEq/L (ref 3.5–5.3)
Total Protein: 8.6 g/dL — ABNORMAL HIGH (ref 6.0–8.3)

## 2014-01-09 LAB — POCT UA - MICROSCOPIC ONLY
Casts, Ur, LPF, POC: NEGATIVE
Crystals, Ur, HPF, POC: NEGATIVE
Mucus, UA: POSITIVE
Trichomonas, UA: POSITIVE
Yeast, UA: NEGATIVE

## 2014-01-09 LAB — COMPLETE METABOLIC PANEL WITHOUT GFR
BUN: 19 mg/dL (ref 6–23)
GFR, Est Non African American: 56 mL/min — ABNORMAL LOW
Glucose, Bld: 93 mg/dL (ref 70–99)
Sodium: 137 meq/L (ref 135–145)
Total Bilirubin: 0.7 mg/dL (ref 0.2–1.2)

## 2014-01-09 LAB — POCT WET PREP WITH KOH
KOH Prep POC: NEGATIVE
Trichomonas, UA: POSITIVE

## 2014-01-09 LAB — URIC ACID: Uric Acid, Serum: 7.7 mg/dL — ABNORMAL HIGH (ref 2.4–7.0)

## 2014-01-09 NOTE — Progress Notes (Signed)
Chief Complaint:  Chief Complaint  Patient presents with  . Follow-up    kidney function issues    HPI: Jamie Salazar is a 53 y.o. female who is here for  Recheck of her urine, she states she feels better but she still has sxs, increase frequency and odor. She has vaginal dc as well. Was recently dx with trichomoniasis and was treated with flagyl  she has not been sexually active since, she laso had 3+ lueks on her UA and was treated with cipro 500 mg BID x 10 days , had some hallucinations with it  But never told provider or dc meds, Currently denies any fevers, chills, n/v/abd pain/flank pain or gross hematuria.   She has also been on colcrys for recurrent bouts of gout and also prednisone for last falre up. Urric acid was 8.9, she has not been following gout diet.   Past Medical History  Diagnosis Date  . Anxiety   . Depression   . Allergy     Zyrtec daily.  . Hypertension   . Arthritis     disability  . Obesity, morbid    Past Surgical History  Procedure Laterality Date  . Gallbladder    . Cholecystectomy     History   Social History  . Marital Status: Single    Spouse Name: N/A    Number of Children: 0  . Years of Education: N/A   Occupational History  . disability     OA, depression   Social History Main Topics  . Smoking status: Never Smoker   . Smokeless tobacco: None  . Alcohol Use: No  . Drug Use: No  . Sexual Activity: No   Other Topics Concern  . None   Social History Narrative   Marital status: single; not dating and not interested.      Children: none      Lives: alone      Employment: disabled due to OA, depression/anxiety      Tobacco:           Family History  Problem Relation Age of Onset  . Lupus Mother   . Diabetes Mother   . Hypertension Mother   . Heart disease Mother     stents placed 75.   No Known Allergies Prior to Admission medications   Medication Sig Start Date End Date Taking? Authorizing Provider    Acetaminophen-Caffeine (EXCEDRIN TENSION HEADACHE) 500-65 MG TABS     Yes Historical Provider, MD  cetirizine (ZYRTEC HIVES RELIEF) 10 MG tablet     Yes Historical Provider, MD  colchicine 0.6 MG tablet Take 1 tablet (0.6 mg total) by mouth daily. 12/02/13  Yes Ed Mandich P Maisey Deandrade, DO  furosemide (LASIX) 40 MG tablet TAKE ONE TABLET BY MOUTH ONCE DAILY 09/27/13  Yes Diahn Waidelich P Candas Deemer, DO  glucosamine-chondroitin 500-400 MG tablet Take 1 tablet by mouth 3 (three) times daily.   Yes Historical Provider, MD  lisinopril (PRINIVIL,ZESTRIL) 10 MG tablet Take 1 tablet (10 mg total) by mouth daily. 09/27/13  Yes Jaegar Croft P Eila Runyan, DO  potassium chloride 20 MEQ/15ML (10%) solution Take 15 mLs (20 mEq total) by mouth daily. 09/27/13  Yes Michayla Mcneil P Julea Hutto, DO  metroNIDAZOLE (FLAGYL) 500 MG tablet Take all 4 tabs PO in a single dose. 11/27/13   Jayleana Colberg P Alessander Sikorski, DO  Misc Natural Products (TART CHERRY ADVANCED) CAPS Take 2 capsules by mouth daily.    Historical Provider, MD  predniSONE (DELTASONE) 20 MG  tablet Take 3 tabs po daily x 3 days, then 2 tabs daily x 3 days, then 1 tab daily x 3 days, then 1/2 tab daily x 3 days 09/20/13   Cheree Fowles P Hartlee Amedee, DO     ROS: The patient denies fevers, chills, night sweats, unintentional weight loss, chest pain, palpitations, wheezing, dyspnea on exertion, nausea, vomiting, abdominal pain,  hematuria, melena, numbness, weakness, or tingling.   All other systems have been reviewed and were otherwise negative with the exception of those mentioned in the HPI and as above.    PHYSICAL EXAM: Filed Vitals:   01/09/14 1107  BP: 130/90  Pulse: 79  Temp: 98 F (36.7 C)  Resp: 18   Filed Vitals:   01/09/14 1107  Height: 5\' 3"  (1.6 m)  Weight: 337 lb (152.862 kg)   Body mass index is 59.71 kg/(m^2).  General: Alert, no acute distress, obese AA female HEENT:  Normocephalic, atraumatic, oropharynx patent. EOMI, PERRLA Cardiovascular:  Regular rate and rhythm, no rubs murmurs or gallops.  Radial pulse intact. No pedal edema.   Respiratory: Clear to auscultation bilaterally.  No wheezes, rales, or rhonchi.  No cyanosis, no use of accessory musculature GI: No organomegaly, abdomen is soft and non-tender, positive bowel sounds.  No masses. Skin: No rashes. Neurologic: Facial musculature symmetric. Psychiatric: Patient is appropriate throughout our interaction. Lymphatic: No cervical lymphadenopathy Musculoskeletal: Gait intact. GU-+ odor, + dc   LABS: Results for orders placed in visit on 01/09/14  POCT UA - MICROSCOPIC ONLY      Result Value Ref Range   WBC, Ur, HPF, POC tntc     RBC, urine, microscopic 2-4     Bacteria, U Microscopic 1+     Mucus, UA pos     Epithelial cells, urine per micros 2-3     Crystals, Ur, HPF, POC neg     Casts, Ur, LPF, POC neg     Yeast, UA neg     Trichomonas, UA pos    POCT URINALYSIS DIPSTICK      Result Value Ref Range   Color, UA yellow     Clarity, UA clear     Glucose, UA neg     Bilirubin, UA neg     Ketones, UA neg     Spec Grav, UA 1.010     Blood, UA neg     pH, UA 7.0     Protein, UA trace     Urobilinogen, UA 1.0     Nitrite, UA neg     Leukocytes, UA large (3+)    POCT WET PREP WITH KOH      Result Value Ref Range   Trichomonas, UA Positive     Clue Cells Wet Prep HPF POC 3-7     Epithelial Wet Prep HPF POC 3-4     Yeast Wet Prep HPF POC netg     Bacteria Wet Prep HPF POC 2+     RBC Wet Prep HPF POC 4-7     WBC Wet Prep HPF POC tntc     KOH Prep POC Negative       EKG/XRAY:   Primary read interpreted by Dr. Marin Comment at [facility].   ASSESSMENT/PLAN: Encounter Diagnoses  Name Primary?  . Frequency of urination Yes  . Vaginal discharge   . Gout without tophus, unspecified cause, unspecified chronicity, unspecified site    Spoke to patient about labs She wants to wait for urine cx since she feele better overall and had "  halucinations" with cipro She will get flagyl  2 gram x 1 , we went over the alternative dosing which would be 500 mg BID x 7 days  of the flagyl.  She states she was complaint and took all 4 pills at the same time the last time it was prescribed 1 month ago. She has not been sexually active with anyone.  Will get repeat urine dip and cx in 1 week Labs pending for creatinine, she does not like taking meds, so will adjust based on uric acid and creatinine level F/u prn, otherwise in 1 week for labs only  Gross sideeffects, risk and benefits, and alternatives of medications d/w patient. Patient is aware that all medications have potential sideeffects and we are unable to predict every sideeffect or drug-drug interaction that may occur.  Inette Doubrava, [place], DO 01/09/2014 1:24 PM

## 2014-01-10 LAB — URINE CULTURE: Colony Count: 10000

## 2014-01-10 MED ORDER — METRONIDAZOLE 500 MG PO TABS: ORAL_TABLET | ORAL | Status: DC

## 2014-01-12 ENCOUNTER — Telehealth: Payer: Self-pay | Admitting: Family Medicine

## 2014-01-12 DIAGNOSIS — A599 Trichomoniasis, unspecified: Secondary | ICD-10-CM

## 2014-01-12 DIAGNOSIS — R8290 Unspecified abnormal findings in urine: Secondary | ICD-10-CM

## 2014-01-12 NOTE — Telephone Encounter (Signed)
Spoke to patient about urine cx results. She is feeling better overall. She will return for repeat urine for trich after flagyl treatment. This will be her second time doing flagyl 2 grams x 1 . If this does not work then I amy have to give it to her BID x 7 days. She would also like flu vaccine.

## 2014-01-16 ENCOUNTER — Other Ambulatory Visit (INDEPENDENT_AMBULATORY_CARE_PROVIDER_SITE_OTHER): Payer: Commercial Managed Care - HMO | Admitting: Family Medicine

## 2014-01-16 ENCOUNTER — Telehealth: Payer: Self-pay | Admitting: Family Medicine

## 2014-01-16 DIAGNOSIS — A599 Trichomoniasis, unspecified: Secondary | ICD-10-CM

## 2014-01-16 DIAGNOSIS — R8290 Unspecified abnormal findings in urine: Secondary | ICD-10-CM

## 2014-01-16 DIAGNOSIS — R350 Frequency of micturition: Secondary | ICD-10-CM

## 2014-01-16 DIAGNOSIS — R82998 Other abnormal findings in urine: Secondary | ICD-10-CM

## 2014-01-16 LAB — POCT UA - MICROSCOPIC ONLY
Casts, Ur, LPF, POC: NEGATIVE
Crystals, Ur, HPF, POC: NEGATIVE
Mucus, UA: POSITIVE
Yeast, UA: NEGATIVE

## 2014-01-16 NOTE — Telephone Encounter (Signed)
Spoke to patient about her urine, neg for trich. She is going to return to give me more specimen for urine cx, still slightly symptomatic.

## 2014-01-19 ENCOUNTER — Other Ambulatory Visit (INDEPENDENT_AMBULATORY_CARE_PROVIDER_SITE_OTHER): Payer: Commercial Managed Care - HMO | Admitting: Family Medicine

## 2014-01-19 DIAGNOSIS — R82998 Other abnormal findings in urine: Secondary | ICD-10-CM

## 2014-01-19 DIAGNOSIS — R8290 Unspecified abnormal findings in urine: Secondary | ICD-10-CM

## 2014-01-19 DIAGNOSIS — A599 Trichomoniasis, unspecified: Secondary | ICD-10-CM

## 2014-01-19 LAB — POCT URINALYSIS DIPSTICK
Bilirubin, UA: NEGATIVE
Glucose, UA: NEGATIVE
Ketones, UA: NEGATIVE
Nitrite, UA: NEGATIVE
Protein, UA: 30
Spec Grav, UA: 1.02
Urobilinogen, UA: 0.2
pH, UA: 5.5

## 2014-01-20 ENCOUNTER — Telehealth: Payer: Self-pay | Admitting: Family Medicine

## 2014-01-20 DIAGNOSIS — N390 Urinary tract infection, site not specified: Secondary | ICD-10-CM

## 2014-01-20 LAB — URINE CULTURE
Colony Count: NO GROWTH
Organism ID, Bacteria: NO GROWTH

## 2014-01-20 MED ORDER — CEPHALEXIN 500 MG PO CAPS: 500.0000 mg | ORAL_CAPSULE | Freq: Two times a day (BID) | ORAL | Status: DC

## 2014-01-20 NOTE — Telephone Encounter (Signed)
Spoke to patient about labs , urine cx still pending ,she will be rx keflex 500 mg BID x 5 days. She had some hallucinations with the cipro but toleraed it ok enough to finish it, she ahs severe anxiety but doe not want to take any SSRIs and states the hallucinations may be her  And no the medicine. She does not have hallucinations currently,s he sometimes get an itching sensation over her body at night due to anxiety. She will come in and I will get a repeat urine micro/dip and also cx next visit in 1-2 weeks, we will also tlak about considering vistaril prn for her since she doe snot like meds.

## 2014-02-03 ENCOUNTER — Encounter: Payer: Self-pay | Admitting: Family Medicine

## 2014-02-27 ENCOUNTER — Ambulatory Visit: Payer: Commercial Managed Care - HMO | Admitting: Family Medicine

## 2014-06-21 ENCOUNTER — Telehealth: Payer: Self-pay

## 2014-06-21 NOTE — Telephone Encounter (Signed)
Pt is requesting a refill of lisinopril.

## 2014-06-22 MED ORDER — LISINOPRIL 10 MG PO TABS: 10.0000 mg | ORAL_TABLET | Freq: Every day | ORAL | Status: DC

## 2014-06-22 NOTE — Telephone Encounter (Signed)
When was she planning on following up? Unable to leave VM.

## 2014-06-22 NOTE — Telephone Encounter (Signed)
Pt made an appt. I sent 30 days worth to her pharmacy.

## 2014-07-24 ENCOUNTER — Encounter: Payer: Self-pay | Admitting: Family Medicine

## 2014-07-24 ENCOUNTER — Ambulatory Visit (INDEPENDENT_AMBULATORY_CARE_PROVIDER_SITE_OTHER): Payer: Commercial Managed Care - HMO | Admitting: Family Medicine

## 2014-07-24 VITALS — BP 140/92 | HR 71 | Temp 98.3°F | Resp 16 | Ht 64.5 in | Wt 329.0 lb

## 2014-07-24 DIAGNOSIS — N390 Urinary tract infection, site not specified: Secondary | ICD-10-CM | POA: Diagnosis not present

## 2014-07-24 DIAGNOSIS — I10 Essential (primary) hypertension: Secondary | ICD-10-CM

## 2014-07-24 DIAGNOSIS — Z136 Encounter for screening for cardiovascular disorders: Secondary | ICD-10-CM

## 2014-07-24 DIAGNOSIS — F411 Generalized anxiety disorder: Secondary | ICD-10-CM | POA: Diagnosis not present

## 2014-07-24 DIAGNOSIS — I878 Other specified disorders of veins: Secondary | ICD-10-CM | POA: Diagnosis not present

## 2014-07-24 DIAGNOSIS — R300 Dysuria: Secondary | ICD-10-CM | POA: Diagnosis not present

## 2014-07-24 DIAGNOSIS — Z113 Encounter for screening for infections with a predominantly sexual mode of transmission: Secondary | ICD-10-CM

## 2014-07-24 DIAGNOSIS — F329 Major depressive disorder, single episode, unspecified: Secondary | ICD-10-CM | POA: Diagnosis not present

## 2014-07-24 DIAGNOSIS — A5900 Urogenital trichomoniasis, unspecified: Secondary | ICD-10-CM

## 2014-07-24 DIAGNOSIS — A499 Bacterial infection, unspecified: Secondary | ICD-10-CM

## 2014-07-24 DIAGNOSIS — F32A Depression, unspecified: Secondary | ICD-10-CM

## 2014-07-24 LAB — POCT URINALYSIS DIPSTICK
Bilirubin, UA: NEGATIVE
Glucose, UA: NEGATIVE
Ketones, UA: NEGATIVE
Nitrite, UA: NEGATIVE
Protein, UA: NEGATIVE
Spec Grav, UA: <=1.005
Urobilinogen, UA: 0.2
pH, UA: 5

## 2014-07-24 LAB — POCT UA - MICROSCOPIC ONLY
Casts, Ur, LPF, POC: NEGATIVE
Crystals, Ur, HPF, POC: NEGATIVE
Mucus, UA: NEGATIVE
Trichomonas, UA: POSITIVE
Yeast, UA: NEGATIVE

## 2014-07-24 MED ORDER — POTASSIUM CHLORIDE 20 MEQ/15ML (10%) PO SOLN: 20.0000 meq | Freq: Every day | ORAL | Status: DC

## 2014-07-24 MED ORDER — FUROSEMIDE 40 MG PO TABS: ORAL_TABLET | ORAL | Status: DC

## 2014-07-24 MED ORDER — METRONIDAZOLE 500 MG PO TABS: ORAL_TABLET | ORAL | Status: DC

## 2014-07-24 MED ORDER — LISINOPRIL 10 MG PO TABS: 10.0000 mg | ORAL_TABLET | Freq: Every day | ORAL | Status: DC

## 2014-07-24 MED ORDER — CEPHALEXIN 500 MG PO CAPS: 500.0000 mg | ORAL_CAPSULE | Freq: Two times a day (BID) | ORAL | Status: DC

## 2014-07-24 NOTE — Progress Notes (Signed)
Chief Complaint:  Chief Complaint  Patient presents with  . Medication Refill    lisinopril    HPI: Jamie Salazar is a 54 y.o. female who is here for : 1. Medication refills on her Lasix and also lisinopril. She is tolerating the medications well. She denies any dizziness, chest pain or palpitations. She takes it with potassium. She has no side effects. 2. She has some burning and dribbling and also a sense of incomplete evacuation with urination. She has a history of urinary tract infections and also Trichomonas in the urine. She states that she has been treated with the medicine Flagyl and a repeat urine dip and micro-were done following the treatment and she was cleared of Trichomonas. She has not been with the same sexual partner since she was treated. She has not had sex at all since she's been treated for trichomonas. 3. She has anxiety. This has been a chronic and ongoing problem for her. She has acute episodes. It is hard for her to leave the house sometimes. She [place] recently [place] this side [place]. This was to be closer to her ailing mother. She [place] be on the side of town, there is not much to do over there. She does not exercise regular. She has been taking light effusion which she got from [place] and she states that it has helped her a lot with her anxiety. She is so much calmer. She skin he continue with this before considering any other prescription medications. She does not like to take prescription medicines. The anxiety is really debilitating for her. She is able to continue to cook and clean for herself. 4. Her gout has been improved and her overall joint pain have been improved with vitamin C. Stopped taking the colchicine because it made her knees fill worse.  Past Medical History  Diagnosis Date  . Anxiety   . Depression   . Allergy     Zyrtec daily.  . Hypertension   . Arthritis     disability  . Obesity, morbid    Past Surgical  History  Procedure Laterality Date  . Gallbladder    . Cholecystectomy     History   Social History  . Marital Status: Single    Spouse Name: N/A  . Number of Children: 0  . Years of Education: N/A   Occupational History  . disability     OA, depression   Social History Main Topics  . Smoking status: Never Smoker   . Smokeless tobacco: Not on file  . Alcohol Use: No  . Drug Use: No  . Sexual Activity: No   Other Topics Concern  . None   Social History Narrative   Marital status: single; not dating and not interested.      Children: none      Lives: alone      Employment: disabled due to OA, depression/anxiety      Tobacco:           Family History  Problem Relation Age of Onset  . Lupus Mother   . Diabetes Mother   . Hypertension Mother   . Heart disease Mother     stents placed 25.   Allergies  Allergen Reactions  . Ciprofloxacin Hcl     Hallucination    Prior to Admission medications   Medication Sig Start Date End Date Taking? Authorizing Provider  cetirizine (ZYRTEC HIVES RELIEF) 10 MG tablet  Yes Historical Provider, MD  furosemide (LASIX) 40 MG tablet TAKE ONE TABLET BY MOUTH ONCE DAILY 07/24/14  Yes Samy Ryner P Leontae Bostock, DO  lisinopril (PRINIVIL,ZESTRIL) 10 MG tablet Take 1 tablet (10 mg total) by mouth daily. 07/24/14  Yes Maely Clements P Aija Scarfo, DO  OVER THE COUNTER MEDICATION    Yes Historical Provider, MD  potassium chloride (K-SOL) 20 MEQ/15ML (10%) SOLN Take 15 mLs (20 mEq total) by mouth daily. 07/24/14  Yes Jaimere Feutz P Tadd Holtmeyer, DO  vitamin C (ASCORBIC ACID) 500 MG tablet Take 500 mg by mouth daily.   Yes Historical Provider, MD  glucosamine-chondroitin 500-400 MG tablet Take 1 tablet by mouth 3 (three) times daily.    Historical Provider, MD  Misc Natural Products (TART CHERRY ADVANCED) CAPS Take 2 capsules by mouth daily.    Historical Provider, MD     ROS: The patient denies fevers, chills, night sweats, unintentional weight loss, chest pain, palpitations, wheezing, dyspnea  on exertion, nausea, vomiting, abdominal pain,  hematuria, melena, numbness, weakness, or tingling.   All other systems have been reviewed and were otherwise negative with the exception of those mentioned in the HPI and as above.    PHYSICAL EXAM: Filed Vitals:   07/24/14 1102  BP: 140/92  Pulse: 71  Temp: 98.3 F (36.8 C)  Resp: 16   Filed Vitals:   07/24/14 1102  Height: 5' 4.5" (1.638 m)  Weight: 329 lb (149.233 kg)   Body mass index is 55.62 kg/(m^2).  General: Alert, no acute , morbidly obese African-American female HEENT:  Normocephalic, atraumatic, oropharynx patent. EOMI, PERRLA Cardiovascular:  Regular rate and rhythm, no rubs murmurs or gallops.  No Carotid bruits, radial pulse intact. No pedal edema.  Respiratory: Clear to auscultation bilaterally.  No wheezes, rales, or rhonchi.  No cyanosis, no use of accessory musculature GI: No organomegaly, abdomen is soft and non-tender, positive bowel sounds.  No masses. Skin: No rashes. Neurologic: Facial musculature symmetric. Psychiatric: Patient is appropriate throughout our interaction. Lymphatic: No cervical lymphadenopathy Musculoskeletal: Gait intact. No CVA tenderness   LABS: Results for orders placed or performed in visit on 07/24/14  POCT UA - Microscopic Only  Result Value Ref Range   WBC, Ur, HPF, POC 5-7    RBC, urine, microscopic 0-2    Bacteria, U Microscopic 1+    Mucus, UA neg    Epithelial cells, urine per micros 0-2    Crystals, Ur, HPF, POC neg    Casts, Ur, LPF, POC neg    Yeast, UA neg    Trichomonas, UA pos   POCT urinalysis dipstick  Result Value Ref Range   Color, UA pale    Clarity, UA clear    Glucose, UA neg    Bilirubin, UA neg    Ketones, UA neg    Spec Grav, UA <=1.005    Blood, UA trace    pH, UA 5.0    Protein, UA neg    Urobilinogen, UA 0.2    Nitrite, UA neg    Leukocytes, UA small (1+)      EKG/XRAY:   Primary read interpreted by Dr. Marin Comment at  [facility].   ASSESSMENT/PLAN: Encounter Diagnoses  Name Primary?  . Essential hypertension Yes  . Anxiety state   . Depression   . Dysuria   . Venous stasis   . Screening for cardiovascular condition   . Trichomoniasis, urogenital   . UTI (urinary tract infection), bacterial   . Screening for STD (sexually transmitted disease)  Will treat UTI with Keflex until urine culture is completed Will treat trichomonas with Flagyl Pending labs for hyperlipidemia, hypertension. Refilled on all her medications for 6 months. Follow-up in 6 months for medication refills Labs pending for STD screen, I am not understanding why she has a repeat trichomonas infection in her urine when she exerted been treated with Flagyl. On top of that we had a confirmation from a urine dip and micro-after treatment that did not show any trichomonas. She has not been sexual since getting Flagyl treatment the last time. I will go ahead and test her for other STDs. She will return in one week for a repeat urine dip and micro-to see if the trichomonas has cleared, also to test for gonorrhea chlamydia. She urinated today are ready and is unable to give [place] another specimen.  Gross sideeffects, risk and benefits, and alternatives of medications d/w patient. Patient is aware that all medications have potential sideeffects and we are unable to predict every sideeffect or drug-drug interaction that may occur.  Serin Thornell, [place], DO 07/24/2014 1:54 PM

## 2014-07-25 ENCOUNTER — Telehealth: Payer: Self-pay | Admitting: Family Medicine

## 2014-07-25 LAB — COMPLETE METABOLIC PANEL WITH GFR
BUN: 17 mg/dL (ref 6–23)
Calcium: 10.1 mg/dL (ref 8.4–10.5)
Chloride: 102 mEq/L (ref 96–112)
Creat: 0.94 mg/dL (ref 0.50–1.10)
GFR, Est Non African American: 69 mL/min
Potassium: 4.3 mEq/L (ref 3.5–5.3)

## 2014-07-25 LAB — TSH: TSH: 2.524 u[IU]/mL (ref 0.350–4.500)

## 2014-07-25 LAB — URINE CULTURE: Colony Count: 30000

## 2014-07-25 LAB — COMPLETE METABOLIC PANEL WITHOUT GFR
ALT: 12 U/L (ref 0–35)
AST: 17 U/L (ref 0–37)
Albumin: 4.1 g/dL (ref 3.5–5.2)
Alkaline Phosphatase: 109 U/L (ref 39–117)
CO2: 26 meq/L (ref 19–32)
GFR, Est African American: 79 mL/min
Glucose, Bld: 96 mg/dL (ref 70–99)
Sodium: 138 meq/L (ref 135–145)
Total Bilirubin: 0.6 mg/dL (ref 0.2–1.2)
Total Protein: 8.2 g/dL (ref 6.0–8.3)

## 2014-07-25 LAB — LIPID PANEL
Cholesterol: 168 mg/dL (ref 0–200)
HDL: 80 mg/dL (ref >=46)
LDL Cholesterol: 78 mg/dL (ref 0–99)
Total CHOL/HDL Ratio: 2.1 Ratio
Triglycerides: 51 mg/dL (ref <150)
VLDL: 10 mg/dL (ref 0–40)

## 2014-07-25 LAB — RPR

## 2014-07-25 LAB — HIV ANTIBODY (ROUTINE TESTING W REFLEX): HIV 1&2 Ab, 4th Generation: NONREACTIVE

## 2014-07-25 NOTE — Telephone Encounter (Signed)
Unable to leave message about labs on both numbers

## 2014-07-27 ENCOUNTER — Telehealth: Payer: Self-pay | Admitting: Family Medicine

## 2014-07-27 NOTE — Telephone Encounter (Signed)
Spoke with patient about labs. Will await for repeat urine and also GC

## 2014-07-31 ENCOUNTER — Other Ambulatory Visit (INDEPENDENT_AMBULATORY_CARE_PROVIDER_SITE_OTHER): Payer: Commercial Managed Care - HMO | Admitting: Family Medicine

## 2014-07-31 DIAGNOSIS — Z113 Encounter for screening for infections with a predominantly sexual mode of transmission: Secondary | ICD-10-CM

## 2014-07-31 DIAGNOSIS — A499 Bacterial infection, unspecified: Secondary | ICD-10-CM

## 2014-07-31 DIAGNOSIS — A5900 Urogenital trichomoniasis, unspecified: Secondary | ICD-10-CM | POA: Diagnosis not present

## 2014-07-31 DIAGNOSIS — N390 Urinary tract infection, site not specified: Secondary | ICD-10-CM | POA: Diagnosis not present

## 2014-07-31 LAB — POCT URINALYSIS DIPSTICK
Bilirubin, UA: NEGATIVE
Blood, UA: NEGATIVE
Glucose, UA: NEGATIVE
Ketones, UA: NEGATIVE
Leukocytes, UA: NEGATIVE
Nitrite, UA: NEGATIVE
Protein, UA: NEGATIVE
Spec Grav, UA: <=1.005
Urobilinogen, UA: 0.2
pH, UA: 5.5

## 2014-07-31 LAB — POCT UA - MICROSCOPIC ONLY
Bacteria, U Microscopic: NEGATIVE
Casts, Ur, LPF, POC: NEGATIVE
Crystals, Ur, HPF, POC: NEGATIVE
Mucus, UA: NEGATIVE
RBC, urine, microscopic: NEGATIVE
WBC, Ur, HPF, POC: NEGATIVE
Yeast, UA: NEGATIVE

## 2014-08-01 LAB — URINE CULTURE
Colony Count: NO GROWTH
Organism ID, Bacteria: NO GROWTH

## 2014-08-01 LAB — GC/CHLAMYDIA PROBE AMP
CT Probe RNA: NEGATIVE
GC Probe RNA: NEGATIVE

## 2014-08-25 ENCOUNTER — Other Ambulatory Visit: Payer: Self-pay

## 2014-08-25 NOTE — Telephone Encounter (Signed)
Pt is requesting a refill on cephALEXin (KEFLEX) 500 MG capsule [[id number], Please advise

## 2014-08-28 NOTE — Telephone Encounter (Signed)
LMOM for pt explaining that we will need to see her for re-eval in order to assess need for Abx or other treatment.

## 2014-10-27 ENCOUNTER — Other Ambulatory Visit: Payer: Self-pay | Admitting: Family Medicine

## 2014-10-28 NOTE — Telephone Encounter (Signed)
Dr Marin Comment, do you want to RF? At April [facility] pt reported she wasn't taking this any more, but now we have a req for RF.

## 2014-12-04 ENCOUNTER — Other Ambulatory Visit: Payer: Self-pay

## 2014-12-04 DIAGNOSIS — F329 Major depressive disorder, single episode, unspecified: Secondary | ICD-10-CM

## 2014-12-04 DIAGNOSIS — I878 Other specified disorders of veins: Secondary | ICD-10-CM

## 2014-12-04 DIAGNOSIS — F411 Generalized anxiety disorder: Secondary | ICD-10-CM

## 2014-12-04 DIAGNOSIS — I10 Essential (primary) hypertension: Secondary | ICD-10-CM

## 2014-12-04 DIAGNOSIS — F32A Depression, unspecified: Secondary | ICD-10-CM

## 2014-12-04 DIAGNOSIS — R300 Dysuria: Secondary | ICD-10-CM

## 2014-12-04 NOTE — Telephone Encounter (Signed)
Patient states that she needs refills on several medications. She is going to be using [facility] and states that they instructed her to call our office to have these refills expedited...Marland KitchenMarland KitchenMarland Kitchen  [phone] (cell)  [phone] (home)

## 2014-12-04 NOTE — Telephone Encounter (Signed)
Pt will like these Rx's sent into [facility]. 90 day supply but was not sure about the liquid potassium.

## 2014-12-04 NOTE — Telephone Encounter (Signed)
Tried to call pt, unable to leave message

## 2014-12-07 MED ORDER — LISINOPRIL 10 MG PO TABS: 10.0000 mg | ORAL_TABLET | Freq: Every day | ORAL | Status: DC

## 2014-12-07 MED ORDER — POTASSIUM CHLORIDE 20 MEQ/15ML (10%) PO SOLN: 20.0000 meq | Freq: Every day | ORAL | Status: DC

## 2014-12-07 MED ORDER — FUROSEMIDE 40 MG PO TABS: ORAL_TABLET | ORAL | Status: DC

## 2014-12-07 MED ORDER — GLUCOSAMINE-CHONDROITIN 500-400 MG PO TABS: 1.0000 | ORAL_TABLET | Freq: Three times a day (TID) | ORAL | Status: DC

## 2014-12-07 NOTE — Telephone Encounter (Signed)
Sent in 3 mos of each, but advised pt she is due to RTC for f/up in about 2 mos. Pt agreed.

## 2015-03-22 ENCOUNTER — Ambulatory Visit: Payer: Commercial Managed Care - HMO | Admitting: Family Medicine

## 2015-04-06 ENCOUNTER — Ambulatory Visit: Payer: Commercial Managed Care - HMO | Admitting: Physician Assistant

## 2015-04-13 ENCOUNTER — Ambulatory Visit (INDEPENDENT_AMBULATORY_CARE_PROVIDER_SITE_OTHER): Payer: Medicare Other | Admitting: Family Medicine

## 2015-04-13 ENCOUNTER — Encounter: Payer: Self-pay | Admitting: Family Medicine

## 2015-04-13 VITALS — BP 118/82 | HR 104 | Temp 98.6°F | Resp 16 | Ht 64.0 in | Wt 338.0 lb

## 2015-04-13 DIAGNOSIS — Z1159 Encounter for screening for other viral diseases: Secondary | ICD-10-CM

## 2015-04-13 DIAGNOSIS — F329 Major depressive disorder, single episode, unspecified: Secondary | ICD-10-CM

## 2015-04-13 DIAGNOSIS — Z23 Encounter for immunization: Secondary | ICD-10-CM | POA: Diagnosis not present

## 2015-04-13 DIAGNOSIS — F411 Generalized anxiety disorder: Secondary | ICD-10-CM

## 2015-04-13 DIAGNOSIS — I10 Essential (primary) hypertension: Secondary | ICD-10-CM | POA: Diagnosis not present

## 2015-04-13 DIAGNOSIS — F32A Depression, unspecified: Secondary | ICD-10-CM

## 2015-04-13 DIAGNOSIS — M10079 Idiopathic gout, unspecified ankle and foot: Secondary | ICD-10-CM

## 2015-04-13 DIAGNOSIS — M109 Gout, unspecified: Secondary | ICD-10-CM

## 2015-04-13 LAB — CBC
HEMATOCRIT: 40.4 % (ref 36.0–46.0)
HEMOGLOBIN: 13.3 g/dL (ref 12.0–15.0)
MCH: 24.9 pg — AB (ref 26.0–34.0)
MCHC: 32.9 g/dL (ref 30.0–36.0)
MCV: 75.7 fL — AB (ref 78.0–100.0)
MPV: 10.1 fL (ref 8.6–12.4)
Platelets: 255 10*3/uL (ref 150–400)
RBC: 5.34 MIL/uL — AB (ref 3.87–5.11)
RDW: 15.9 % — ABNORMAL HIGH (ref 11.5–15.5)
WBC: 9.9 10*3/uL (ref 4.0–10.5)

## 2015-04-13 LAB — COMPLETE METABOLIC PANEL WITH GFR
ALT: 19 U/L (ref 6–29)
AST: 20 U/L (ref 10–35)
Albumin: 4.2 g/dL (ref 3.6–5.1)
Alkaline Phosphatase: 113 U/L (ref 33–130)
BUN: 17 mg/dL (ref 7–25)
CO2: 25 mmol/L (ref 20–31)
Calcium: 9.5 mg/dL (ref 8.6–10.4)
Chloride: 101 mmol/L (ref 98–110)
Creat: 1.05 mg/dL (ref 0.50–1.05)
GFR, EST NON AFRICAN AMERICAN: 60 mL/min (ref >=60)
GFR, Est African American: 69 mL/min (ref >=60)
GLUCOSE: 95 mg/dL (ref 65–99)
POTASSIUM: 3.8 mmol/L (ref 3.5–5.3)
SODIUM: 139 mmol/L (ref 135–146)
Total Bilirubin: 0.6 mg/dL (ref 0.2–1.2)
Total Protein: 8.3 g/dL — ABNORMAL HIGH (ref 6.1–8.1)

## 2015-04-13 MED ORDER — LISINOPRIL 10 MG PO TABS: 10.0000 mg | ORAL_TABLET | Freq: Every day | ORAL | Status: DC

## 2015-04-13 MED ORDER — FUROSEMIDE 40 MG PO TABS: ORAL_TABLET | ORAL | Status: DC

## 2015-04-13 MED ORDER — POTASSIUM CHLORIDE 20 MEQ/15ML (10%) PO SOLN: 20.0000 meq | Freq: Every day | ORAL | Status: DC

## 2015-04-13 MED ORDER — COLCHICINE 0.6 MG PO TABS: 0.6000 mg | ORAL_TABLET | Freq: Every day | ORAL | Status: DC

## 2015-04-13 NOTE — Patient Instructions (Signed)
Please exercise at least 3 days a week Increase your fiber      Why follow it? Research shows. . Those who follow the Mediterranean diet have a reduced risk of heart disease  . The diet is associated with a reduced incidence of Parkinson's and Alzheimer's diseases . People following the diet may have longer life expectancies and lower rates of chronic diseases  . The Dietary Guidelines for Americans recommends the Mediterranean diet as an eating plan to promote health and prevent disease  What Is the Mediterranean Diet?  . Healthy eating plan based on typical foods and recipes of Mediterranean-style cooking . The diet is primarily a plant based diet; these foods should make up a majority of meals   Starches - Plant based foods should make up a majority of meals - They are an important sources of vitamins, minerals, energy, antioxidants, and fiber - Choose whole grains, foods high in fiber and minimally processed items  - Typical grain sources include wheat, oats, barley, corn, brown rice, bulgar, farro, millet, polenta, couscous  - Various types of beans include chickpeas, lentils, fava beans, black beans, white beans   Fruits  Veggies - Large quantities of antioxidant rich fruits & veggies; 6 or more servings  - Vegetables can be eaten raw or lightly drizzled with oil and cooked  - Vegetables common to the traditional Mediterranean Diet include: artichokes, arugula, beets, broccoli, brussel sprouts, cabbage, carrots, celery, collard greens, cucumbers, eggplant, kale, leeks, lemons, lettuce, mushrooms, okra, onions, peas, peppers, potatoes, pumpkin, radishes, rutabaga, shallots, spinach, sweet potatoes, turnips, zucchini - Fruits common to the Mediterranean Diet include: apples, apricots, avocados, cherries, clementines, dates, figs, grapefruits, grapes, melons, nectarines, oranges, peaches, pears, pomegranates, strawberries, tangerines  Fats - Replace butter and margarine with healthy oils,  such as olive oil, canola oil, and tahini  - Limit nuts to no more than a handful a day  - Nuts include walnuts, almonds, pecans, pistachios, pine nuts  - Limit or avoid candied, honey roasted or heavily salted nuts - Olives are central to the [employer] - can be eaten whole or used in a variety of dishes   Meats Protein - Limiting red meat: no more than a few times a month - When eating red meat: choose lean cuts and keep the portion to the size of deck of cards - Eggs: approx. 0 to 4 times a week  - Fish and lean poultry: at least 2 a week  - Healthy protein sources include, chicken, [place], lean beef, lamb - Increase intake of seafood such as tuna, salmon, trout, mackerel, shrimp, scallops - Avoid or limit high fat processed meats such as sausage and bacon  Dairy - Include moderate amounts of low fat dairy products  - Focus on healthy dairy such as fat free yogurt, skim milk, low or reduced fat cheese - Limit dairy products higher in fat such as whole or 2% milk, cheese, ice cream  Alcohol - Moderate amounts of red wine is ok  - No more than 5 oz daily for women (all ages) and men older than age 50  - No more than 10 oz of wine daily for men younger than 22  Other - Limit sweets and other desserts  - Use herbs and spices instead of salt to flavor foods  - Herbs and spices common to the traditional Mediterranean Diet include: basil, bay leaves, chives, cloves, cumin, fennel, garlic, lavender, marjoram, mint, oregano, parsley, pepper, rosemary, sage, savory, sumac, tarragon, thyme  It's not just a diet, it's a lifestyle:  . The Mediterranean diet includes lifestyle factors typical of those in the region  . Foods, drinks and meals are best eaten with others and savored . Daily physical activity is important for overall good health . This could be strenuous exercise like running and aerobics . This could also be more leisurely activities such as walking, housework, yard-work, or  taking the stairs . Moderation is the key; a balanced and healthy diet accommodates most foods and drinks . Consider portion sizes and frequency of consumption of certain foods   Meal Ideas & Options:  . Breakfast:  o Whole wheat toast or whole wheat English muffins with peanut butter & hard boiled egg o Steel cut oats topped with apples & cinnamon and skim milk  o Fresh fruit: banana, strawberries, melon, berries, peaches  o Smoothies: strawberries, bananas, greek yogurt, peanut butter o Low fat greek yogurt with blueberries and granola  o Egg white omelet with spinach and mushrooms o Breakfast couscous: whole wheat couscous, apricots, skim milk, cranberries  . Sandwiches:  o Hummus and grilled vegetables (peppers, zucchini, squash) on whole wheat bread   o Grilled chicken on whole wheat pita with lettuce, tomatoes, cucumbers or tzatziki  o Tuna salad on whole wheat bread: tuna salad made with greek yogurt, olives, red peppers, capers, green onions o Garlic rosemary lamb pita: lamb sauted with garlic, rosemary, salt & pepper; add lettuce, cucumber, greek yogurt to pita - flavor with lemon juice and black pepper  . Seafood:  o Mediterranean grilled salmon, seasoned with garlic, basil, parsley, lemon juice and black pepper o Shrimp, lemon, and spinach whole-grain pasta salad made with low fat greek yogurt  o Seared scallops with lemon orzo  o Seared tuna steaks seasoned salt, pepper, coriander topped with tomato mixture of olives, tomatoes, olive oil, minced garlic, parsley, green onions and cappers  . Meats:  o Herbed greek chicken salad with kalamata olives, cucumber, feta  o Red bell peppers stuffed with spinach, bulgur, lean ground beef (or lentils) & topped with feta   o Kebabs: skewers of chicken, tomatoes, onions, zucchini, squash  o [place] burgers: made with red onions, mint, dill, lemon juice, feta cheese topped with roasted red peppers . Vegetarian o Cucumber salad: cucumbers,  artichoke hearts, celery, red onion, feta cheese, tossed in olive oil & lemon juice  o Hummus and whole grain pita points with a greek salad (lettuce, tomato, feta, olives, cucumbers, red onion) o Lentil soup with celery, carrots made with vegetable broth, garlic, salt and pepper  o Tabouli salad: parsley, bulgur, mint, scallions, cucumbers, tomato, radishes, lemon juice, olive oil, salt and pepper.

## 2015-04-13 NOTE — Progress Notes (Signed)
Subjective:    Patient ID: Jamie Salazar, female    DOB: 09-20-1960, 54 y.o.   MRN: [id number]  HPI This is a pleasant 54 yo female who presents today for medication refill, follow up of HTN, knee pain.  Her only complaint today is her arthritis in her knees. She thinks that taking colchine makes her joints hurt more. She has been drinking black cherry juice and thinks it helps. Gout is worse with red meat, seafood, coffee. She is able to keep her gout at [place] by avoiding triggers, but would like to continue to have colchicine on hand for occasional flares. No chest pain or problems with breathing.   Occasionally has feeling of needing to burp and pass gas but can't. Good bowel movements.   She reports that her mood is good. She enjoys shopping. She attends church. She belongs to the [facility] and has attended Silver Sne[employer] classes in the past, hasn't attended lately, but thinks she would benefit from going back.   Past Medical History  Diagnosis Date  . Anxiety   . Depression   . Allergy     Zyrtec daily.  . Hypertension   . Arthritis     disability  . Obesity, morbid [facility])    Past Surgical History  Procedure Laterality Date  . Gallbladder    . Cholecystectomy     Family History  Problem Relation Age of Onset  . Lupus Mother   . Diabetes Mother   . Hypertension Mother   . Heart disease Mother     stents placed 66.   Social History  Substance Use Topics  . Smoking status: Never Smoker   . Smokeless tobacco: None  . Alcohol Use: No    Review of Systems  Constitutional: Negative for fever.  Respiratory: Negative for cough, shortness of breath and wheezing.   Cardiovascular: Negative for chest pain and leg swelling.  Gastrointestinal: Positive for abdominal distention (feels gassy. ). Negative for nausea, vomiting, diarrhea and constipation.  Endocrine: Negative for polydipsia, polyphagia and polyuria.  Genitourinary: Negative for dysuria and frequency.    Musculoskeletal: Positive for arthralgias (knees).  Psychiatric/Behavioral: Negative.        Objective:   Physical Exam Physical Exam  Constitutional: Oriented to person, place, and time. SHe appears well-developed and well-nourished. Morbidly obese.  HENT:  Head: Normocephalic and atraumatic.  Eyes: Conjunctivae are normal.  Neck: Normal range of motion. Neck supple.  Cardiovascular: Normal rate, regular rhythm and normal heart sounds.   Pulmonary/Chest: Effort normal and breath sounds normal.  Musculoskeletal: Normal range of motion.  Neurological: Alert and oriented to person, place, and time.  Skin: Skin is warm and dry.  Psychiatric: Normal mood and affect. Behavior is normal. Judgment and thought content normal.  Vitals reviewed.  BP 151/89 mmHg  Pulse 104  Temp(Src) 98.6 F (37 C) (Oral)  Resp 16  Ht 5\' 4"  (1.626 m)  Wt 338 lb (153.316 kg)  BMI 57.99 kg/m2  SpO2 98% Wt Readings from Last 3 Encounters:  04/13/15 338 lb (153.316 kg)  07/24/14 329 lb (149.233 kg)  01/09/14 337 lb (152.862 kg)   Depression screen [facility] 2/9 04/13/2015 07/24/2014  Decreased Interest 0 0  Down, Depressed, Hopeless 0 0  PHQ - 2 Score 0 0      Assessment & Plan:  1. Essential hypertension - potassium chloride (K-SOL) 20 MEQ/15ML (10%) SOLN; Take 15 mLs (20 mEq total) by mouth daily.  Dispense: 1419 mL; Refill: 1 - lisinopril (  PRINIVIL,ZESTRIL) 10 MG tablet; Take 1 tablet (10 mg total) by mouth daily.  Dispense: 90 tablet; Refill: 1 - furosemide (LASIX) 40 MG tablet; TAKE ONE TABLET BY MOUTH ONCE DAILY  Dispense: 90 tablet; Refill: 1 - COMPLETE METABOLIC PANEL WITH GFR - CBC  2. Anxiety state - patient reports she has been doing better with her mood lately  3. Depression - see #2  4. Acute gout of foot, unspecified cause, unspecified laterality - colchicine 0.6 MG tablet; Take 1 tablet (0.6 mg total) by mouth daily.  Dispense: 90 tablet; Refill: 1 - encouraged her to avoid  triggers  5. Morbid obesity due to excess calories ([place]) - encouraged healthy food choices, restarting Silver Sneakers Exercise at least 3x week, discussed weight loss goals of 1/2-1 pound per week.  - Hemoglobin A1c  6. Flu vaccine need - Flu Vaccine QUAD 36+ mos IM  7. Need for hepatitis C screening test - Hepatitis C antibody  8. Bilateral knee pain - this chronic condition has not gotten worse, patient desires to continue her cherry extract supplement. I encouraged increased activity as tolerated and discussed positive benefit of weight loss on her joints.   - follow up in 6 months  Clarene Reamer, FNP-BC  [facility] [place] [facility]  04/14/2015 9:22 PM

## 2015-04-14 LAB — HEMOGLOBIN A1C
Hgb A1c MFr Bld: 6.1 % — ABNORMAL HIGH (ref <5.7)
Mean Plasma Glucose: 128 mg/dL — ABNORMAL HIGH (ref <117)

## 2015-04-14 LAB — HEPATITIS C ANTIBODY: HCV Ab: NEGATIVE

## 2015-10-12 ENCOUNTER — Ambulatory Visit (INDEPENDENT_AMBULATORY_CARE_PROVIDER_SITE_OTHER): Payer: Medicare Other | Admitting: Family Medicine

## 2015-10-12 ENCOUNTER — Ambulatory Visit: Payer: Medicare Other | Admitting: Family Medicine

## 2015-10-12 ENCOUNTER — Encounter: Payer: Self-pay | Admitting: Family Medicine

## 2015-10-12 VITALS — BP 139/85 | HR 94 | Temp 98.4°F | Resp 18 | Ht 64.0 in | Wt 347.0 lb

## 2015-10-12 DIAGNOSIS — I10 Essential (primary) hypertension: Secondary | ICD-10-CM

## 2015-10-12 DIAGNOSIS — M170 Bilateral primary osteoarthritis of knee: Secondary | ICD-10-CM | POA: Diagnosis not present

## 2015-10-12 DIAGNOSIS — F32A Depression, unspecified: Secondary | ICD-10-CM

## 2015-10-12 DIAGNOSIS — F329 Major depressive disorder, single episode, unspecified: Secondary | ICD-10-CM | POA: Diagnosis not present

## 2015-10-12 LAB — COMPLETE METABOLIC PANEL WITH GFR
ALBUMIN: 4.1 g/dL (ref 3.6–5.1)
ALT: 18 U/L (ref 6–29)
AST: 18 U/L (ref 10–35)
Alkaline Phosphatase: 114 U/L (ref 33–130)
BUN: 21 mg/dL (ref 7–25)
CHLORIDE: 102 mmol/L (ref 98–110)
CO2: 26 mmol/L (ref 20–31)
CREATININE: 1.05 mg/dL (ref 0.50–1.05)
Calcium: 9.5 mg/dL (ref 8.6–10.4)
GFR, EST NON AFRICAN AMERICAN: 60 mL/min (ref >=60)
GFR, Est African American: 69 mL/min (ref >=60)
GLUCOSE: 108 mg/dL — AB (ref 65–99)
POTASSIUM: 4.2 mmol/L (ref 3.5–5.3)
SODIUM: 141 mmol/L (ref 135–146)
Total Bilirubin: 0.6 mg/dL (ref 0.2–1.2)
Total Protein: 8.2 g/dL — ABNORMAL HIGH (ref 6.1–8.1)

## 2015-10-12 LAB — CBC
HEMATOCRIT: 39.4 % (ref 35.0–45.0)
Hemoglobin: 12.9 g/dL (ref 11.7–15.5)
MCH: 25.1 pg — ABNORMAL LOW (ref 27.0–33.0)
MCHC: 32.7 g/dL (ref 32.0–36.0)
MCV: 76.8 fL — ABNORMAL LOW (ref 80.0–100.0)
MPV: 9.8 fL (ref 7.5–12.5)
PLATELETS: 250 10*3/uL (ref 140–400)
RBC: 5.13 MIL/uL — ABNORMAL HIGH (ref 3.80–5.10)
RDW: 16.4 % — AB (ref 11.0–15.0)
WBC: 8.9 10*3/uL (ref 3.8–10.8)

## 2015-10-12 LAB — HEMOGLOBIN A1C
HEMOGLOBIN A1C: 6.2 % — AB (ref <5.7)
Mean Plasma Glucose: 131 mg/dL

## 2015-10-12 LAB — TSH: TSH: 2.02 m[IU]/L

## 2015-10-12 MED ORDER — POTASSIUM CHLORIDE 20 MEQ/15ML (10%) PO SOLN: 20.0000 meq | Freq: Every day | ORAL | Status: DC

## 2015-10-12 MED ORDER — FUROSEMIDE 40 MG PO TABS: ORAL_TABLET | ORAL | Status: DC

## 2015-10-12 MED ORDER — LISINOPRIL 10 MG PO TABS: 10.0000 mg | ORAL_TABLET | Freq: Every day | ORAL | Status: DC

## 2015-10-12 NOTE — Patient Instructions (Addendum)
  Please set a regular bedtime and stick to it   Please start going to [place] this week  Consider a consultation for weight loss surgery- call [facility] and see when they are having a weight loss surgery information session- [phone]   IF you received an x-ray today, you will receive an invoice from [facility]. Please contact [facility] at [phone] with questions or concerns regarding your invoice.   IF you received labwork today, you will receive an invoice from [employer]. Please contact [facility] at [phone] with questions or concerns regarding your invoice.   Our billing staff will not be able to assist you with questions regarding bills from these companies.  You will be contacted with the lab results as soon as they are available. The fastest way to get your results is to activate your My Chart account. Instructions are located on the last page of this paperwork. If you have not heard from [place] regarding the results in 2 weeks, please contact this office.

## 2015-10-12 NOTE — Progress Notes (Signed)
Subjective:    Patient ID: Jamie Salazar, female    DOB: 1960-07-25, 55 y.o.   MRN: [id number]  HPI This is a pleasant 55 yo female who presents for follow up of HTN, depression, obesity.   Tolerating medications without side effects. Has edema after standing or sitting and worse with hot weather.   She reports her mood is stable. She lives with and takes care of her mother. She has been taking care of her mother for many years, and feels like the roles are reversed and she does the mothering. She feels unappreciated. She admits to eating due to her depression. She doesn't get out much by herself or have many hobbies or interests. Her sleep is erratic, with varying bedtimes. She has seen therapist in the past and according to the patient, they "finished." She doesn't think therapy would be helpful at this point. She has seen nutrition prior and "knows what to do."   She continues to have bilateral knee pain. Takes tylenol with some relief. Has to alter her activities quite a bit due to pain.     Past Medical History  Diagnosis Date  . Anxiety   . Depression   . Allergy     Zyrtec daily.  . Hypertension   . Arthritis     disability  . Obesity, morbid [facility])    Past Surgical History  Procedure Laterality Date  . Gallbladder    . Cholecystectomy     Family History  Problem Relation Age of Onset  . Lupus Mother   . Diabetes Mother   . Hypertension Mother   . Heart disease Mother     stents placed 80.   Social History  Substance Use Topics  . Smoking status: Never Smoker   . Smokeless tobacco: None  . Alcohol Use: No     Review of Systems No chest pain, no SOB, + LE edema, + bilateral knee pain    Objective:   Physical Exam  Constitutional: She appears well-developed and well-nourished. No distress.  Morbidly obese.   HENT:  Head: Normocephalic and atraumatic.  Cardiovascular: Normal rate, regular rhythm and normal heart sounds.   Pulmonary/Chest: Effort normal  and breath sounds normal.  Musculoskeletal: She exhibits edema (trace pretibial edema).  Skin: Skin is warm and dry. She is not diaphoretic.  Psychiatric: She has a normal mood and affect. Her behavior is normal. Judgment and thought content normal.  Vitals reviewed.     BP 151/94 mmHg  Pulse 94  Temp(Src) 98.4 F (36.9 C) (Oral)  Resp 18  Ht 5\' 4"  (1.626 m)  Wt 347 lb (157.398 kg)  BMI 59.53 kg/m2 Wt Readings from Last 3 Encounters:  10/12/15 347 lb (157.398 kg)  04/13/15 338 lb (153.316 kg)  07/24/14 329 lb (149.233 kg)  Repeat BP- 139/85     Assessment & Plan:  1. Essential hypertension - well controlled - CBC - COMPLETE METABOLIC PANEL WITH GFR - Hemoglobin A1c - furosemide (LASIX) 40 MG tablet; TAKE ONE TABLET BY MOUTH ONCE DAILY  Dispense: 90 tablet; Refill: 1 - lisinopril (PRINIVIL,ZESTRIL) 10 MG tablet; Take 1 tablet (10 mg total) by mouth daily.  Dispense: 90 tablet; Refill: 1 - potassium chloride (K-SOL) 20 MEQ/15ML (10%) SOLN; Take 15 mLs (20 mEq total) by mouth daily.  Dispense: 1419 mL; Refill: 1  2. Depression - patient with some insight into behavior - encouraged her to focus on improved sleep and going to the [facility] to participate in Silver  Sneakers  3. Morbid obesity due to excess calories [facility]) - discussed bariatric surgery and patient agreed to consider. Provided her the phone number for [facility] [place] GFR - Hemoglobin A1c  4. Osteoarthritis of both knees, unspecified osteoarthritis type - discussed role of weight, encouraged increased activity as tolerated, can use ice/heat, tylenol two tablets twice a day as needed   Clarene Reamer, FNP-BC  [facility] [place] [facility]  10/12/2015 4:00 PM

## 2015-10-14 ENCOUNTER — Encounter: Payer: Self-pay | Admitting: Family Medicine

## 2016-04-20 ENCOUNTER — Other Ambulatory Visit: Payer: Self-pay | Admitting: Family Medicine

## 2016-04-20 DIAGNOSIS — I10 Essential (primary) hypertension: Secondary | ICD-10-CM

## 2016-04-25 NOTE — Telephone Encounter (Signed)
Pt is calling about her refill of potassium chloride.  She would like it sent in to [facility].  Can you please refill asap.  Thanks!

## 2016-04-25 NOTE — Telephone Encounter (Signed)
Patient left a message on triage line today 04/25/2016 and wanted to know from Dr. Carlean Purl if she is to continue taking the potassium/

## 2016-04-26 NOTE — Telephone Encounter (Signed)
I will send in 1 month supply, please call patient and let her know that she needs to have an office visit for further refills. Please tell her that I am no longer at [facility] but she can see another provider there.

## 2016-04-26 NOTE — Telephone Encounter (Signed)
Pt was notified of results and instructions, pt verbalized understanding.   

## 2016-04-26 NOTE — Telephone Encounter (Signed)
LMTRC

## 2016-06-26 ENCOUNTER — Ambulatory Visit: Payer: Medicare Other

## 2016-06-30 ENCOUNTER — Ambulatory Visit: Payer: Medicare Other | Admitting: Family Medicine

## 2016-07-05 ENCOUNTER — Encounter: Payer: Self-pay | Admitting: Family Medicine

## 2016-07-05 ENCOUNTER — Ambulatory Visit (INDEPENDENT_AMBULATORY_CARE_PROVIDER_SITE_OTHER): Payer: Medicare Other | Admitting: Family Medicine

## 2016-07-05 VITALS — BP 136/87 | HR 91 | Temp 97.9°F | Resp 18 | Ht 62.01 in | Wt 360.0 lb

## 2016-07-05 DIAGNOSIS — I10 Essential (primary) hypertension: Secondary | ICD-10-CM | POA: Diagnosis not present

## 2016-07-05 DIAGNOSIS — J302 Other seasonal allergic rhinitis: Secondary | ICD-10-CM | POA: Diagnosis not present

## 2016-07-05 DIAGNOSIS — F341 Dysthymic disorder: Secondary | ICD-10-CM

## 2016-07-05 DIAGNOSIS — R7303 Prediabetes: Secondary | ICD-10-CM

## 2016-07-05 LAB — COMPREHENSIVE METABOLIC PANEL
ALBUMIN: 4.4 g/dL (ref 3.5–5.2)
ALT: 20 U/L (ref 0–35)
AST: 20 U/L (ref 0–37)
Alkaline Phosphatase: 107 U/L (ref 39–117)
BUN: 20 mg/dL (ref 6–23)
CHLORIDE: 101 meq/L (ref 96–112)
CO2: 29 mEq/L (ref 19–32)
CREATININE: 1.1 mg/dL (ref 0.40–1.20)
Calcium: 10.6 mg/dL — ABNORMAL HIGH (ref 8.4–10.5)
GFR: 65.83 mL/min (ref >60.00)
Glucose, Bld: 125 mg/dL — ABNORMAL HIGH (ref 70–99)
Potassium: 3.7 mEq/L (ref 3.5–5.1)
SODIUM: 139 meq/L (ref 135–145)
Total Bilirubin: 0.6 mg/dL (ref 0.2–1.2)
Total Protein: 9.1 g/dL — ABNORMAL HIGH (ref 6.0–8.3)

## 2016-07-05 LAB — LIPID PANEL
CHOLESTEROL: 183 mg/dL (ref 0–200)
HDL: 73.5 mg/dL (ref >39.00)
LDL CALC: 92 mg/dL (ref 0–99)
NonHDL: 109.46
Total CHOL/HDL Ratio: 2
Triglycerides: 87 mg/dL (ref 0.0–149.0)
VLDL: 17.4 mg/dL (ref 0.0–40.0)

## 2016-07-05 LAB — VITAMIN D 25 HYDROXY (VIT D DEFICIENCY, FRACTURES): VITD: 30.63 ng/mL (ref 30.00–100.00)

## 2016-07-05 LAB — TSH: TSH: 2.25 u[IU]/mL (ref 0.35–4.50)

## 2016-07-05 LAB — CBC
HCT: 41.2 % (ref 36.0–46.0)
Hemoglobin: 13.4 g/dL (ref 12.0–15.0)
MCHC: 32.6 g/dL (ref 30.0–36.0)
MCV: 76.3 fl — AB (ref 78.0–100.0)
Platelets: 258 10*3/uL (ref 150.0–400.0)
RBC: 5.4 Mil/uL — AB (ref 3.87–5.11)
RDW: 16.9 % — ABNORMAL HIGH (ref 11.5–15.5)
WBC: 10.1 10*3/uL (ref 4.0–10.5)

## 2016-07-05 MED ORDER — POTASSIUM CHLORIDE 20 MEQ/15ML (10%) PO SOLN: ORAL | 5 refills | Status: DC

## 2016-07-05 MED ORDER — LISINOPRIL 10 MG PO TABS: 10.0000 mg | ORAL_TABLET | Freq: Every day | ORAL | 1 refills | Status: DC

## 2016-07-05 MED ORDER — FUROSEMIDE 40 MG PO TABS: ORAL_TABLET | ORAL | 1 refills | Status: DC

## 2016-07-05 NOTE — Progress Notes (Signed)
Pre visit review using our clinic review tool, if applicable. No additional management support is needed unless otherwise documented below in the visit note. 

## 2016-07-05 NOTE — Patient Instructions (Addendum)
It was a pleasure to see you today! Thanks for coming to see me.   Please visit the [facility] [address] [phone]  I have put in a referral to the [facility] exercise program, Vanita Ingles will call you with information  Please follow up in 3 months for complete physical exam.

## 2016-07-05 NOTE — Progress Notes (Signed)
Subjective:    Patient ID: Jamie Salazar, female    DOB: 08/17/1960, 56 y.o.   MRN: [id number]  HPI This is a 56 yo female who presents today to establish care, has been seen by me previously at [facility]. She has been trying to lose weight, taking vinegar daily. Recognizes that depression and anxiety and boredom contribute to her over eating. She lives with her 10 yo mother. No recent gout flares. Worries about her family members.  Needs refills of medicines.  No chest pain, no SOB, aches in knees. Some swelling in feet by end of day.  Has had recent clear nasal drainage, occasionally bloody, frequent post nasal drainage, no fever, no sinus pain, some sneezing and itchy eyes. Has not been taking cetirizine lately.   Past Medical History:  Diagnosis Date  . Allergy    Zyrtec daily.  . Anxiety   . Arthritis    disability  . Depression   . Hypertension   . Obesity, morbid [facility])    Past Surgical History:  Procedure Laterality Date  . CHOLECYSTECTOMY    . gallbladder     Family History  Problem Relation Age of Onset  . Lupus Mother   . Diabetes Mother   . Hypertension Mother   . Heart disease Mother     stents placed 9.   Social History  Substance Use Topics  . Smoking status: Never Smoker  . Smokeless tobacco: Current User    Types: Snuff  . Alcohol use No      Review of Systems  Constitutional: Negative for fever.  HENT: Positive for postnasal drip, rhinorrhea and sneezing. Negative for sinus pain and sinus pressure.   Eyes: Positive for itching.  Respiratory: Positive for cough (dry). Negative for chest tightness, shortness of breath and wheezing.   Cardiovascular: Positive for leg swelling. Negative for chest pain.  Gastrointestinal: Positive for constipation (improved with vinegar).  Musculoskeletal: Positive for arthralgias (knees).  Neurological: Positive for headaches (rare).  Psychiatric/Behavioral: Positive for dysphoric mood and  sleep disturbance (occasionally). The patient is nervous/anxious.        Objective:   Physical Exam  Constitutional: She is oriented to person, place, and time. She appears well-developed and well-nourished. No distress.  Morbidly obese.  HENT:  Head: Normocephalic and atraumatic.  Nose: Mucosal edema and rhinorrhea present.  Mouth/Throat: Oropharynx is clear and moist.  Eyes: Conjunctivae are normal.  Cardiovascular: Normal rate, regular rhythm and normal heart sounds.   Pulmonary/Chest: Effort normal and breath sounds normal.  Musculoskeletal: She exhibits no edema.  Neurological: She is oriented to person, place, and time.  Skin: Skin is warm and dry. She is not diaphoretic.  Psychiatric: She has a normal mood and affect. Her behavior is normal. Judgment and thought content normal.  Vitals reviewed.     BP 136/87 (BP Location: Right Wrist, Patient Position: Sitting, Cuff Size: Normal)   Pulse 91   Temp 97.9 F (36.6 C) (Oral)   Resp 18   Ht 5' 2.01" (1.575 m)   Wt (!) 360 lb (163.3 kg)   SpO2 98%   BMI 65.83 kg/m  Wt Readings from Last 3 Encounters:  07/05/16 (!) 360 lb (163.3 kg)  10/12/15 (!) 347 lb (157.4 kg)  04/13/15 (!) 338 lb (153.3 kg)   Depression screen [facility] 2/9 07/05/2016 10/12/2015 04/13/2015 07/24/2014  Decreased Interest 0 0 0 0  Down, Depressed, Hopeless 0 0 0 0  PHQ - 2 Score 0  0 0 0       Assessment & Plan:  1. Essential hypertension - continue current meds - CBC - Comprehensive metabolic panel - furosemide (LASIX) 40 MG tablet; TAKE ONE TABLET BY MOUTH ONCE DAILY  Dispense: 90 tablet; Refill: 1 - lisinopril (PRINIVIL,ZESTRIL) 10 MG tablet; Take 1 tablet (10 mg total) by mouth daily.  Dispense: 90 tablet; Refill: 1 - potassium chloride 20 MEQ/15ML (10%) SOLN; take 15 milliliters by mouth once daily  Dispense: 473 mL; Refill: 5 - Amb Referral To Provider Referral Exercise Program (P.R.E.P)  2. DEPRESSION/ANXIETY - patient with insight and ready  to work on increasing activity - provided her with name/address of [employer] [facility] [place] - Amb Referral To Provider Referral Exercise Program (P.R.E.P)  3. MORBID OBESITY - discussed factors that lead to overeating and encouraged diversions. Encouraged her to increase vegetables, fruits, lean protein.  - CBC - Comprehensive metabolic panel - Lipid panel - TSH - VITAMIN D 25 Hydroxy (Vit-D Deficiency, Fractures) - Amb Referral To Provider Referral Exercise Program (P.R.E.P)  4. Prediabetes - Comprehensive metabolic panel - Lipid panel - Amb Referral To Provider Referral Exercise Program (P.R.E.P)  5. Chronic seasonal allergic rhinitis, unspecified trigger - cetirizine daily, saline nasal spray prn  - follow up in 3 months for CPE  Over 45 minutes were spent face-to-face with the patient during this encounter and >50% of that time was spent on counseling and coordination of care   Clarene Reamer, FNP-BC  [facility] [place], [place]  07/05/2016 11:34 AM

## 2016-07-07 ENCOUNTER — Telehealth: Payer: Self-pay | Admitting: Family Medicine

## 2016-07-07 ENCOUNTER — Encounter: Payer: Self-pay | Admitting: Family Medicine

## 2016-07-07 NOTE — Telephone Encounter (Signed)
Patient is returning your call RE lab results.  Thank you,  -LL

## 2016-07-07 NOTE — Telephone Encounter (Signed)
Called and spoke with pt informing her of results. Understanding verbalized nothing further needed at this time.

## 2016-07-10 ENCOUNTER — Telehealth: Payer: Self-pay

## 2016-07-10 NOTE — Telephone Encounter (Signed)
Called Araeya and left a VM on her cellphone number to call back to discuss the PREP she's been referred into.

## 2016-08-18 ENCOUNTER — Telehealth: Payer: Self-pay

## 2016-08-18 NOTE — Telephone Encounter (Signed)
Left VM for pt in regards to PREP referral.  Also left VM on 4/9 w/o response back.  Will follow up again next week.

## 2016-09-20 ENCOUNTER — Other Ambulatory Visit: Payer: Self-pay | Admitting: Family Medicine

## 2016-09-29 NOTE — Telephone Encounter (Signed)
Patient requesting refill last seen in office 07/05/16. Medication is not currently on medication list. Last seen for this issue 04/13/15 Okay to fill prescription?

## 2016-09-29 NOTE — Telephone Encounter (Signed)
**  Remind patient they can make refill requests via MyChart**  Medication refill request (Name & Dosage):  Colchicine for gout  Preferred pharmacy (Name & Address):  [facility] - [place], [place] [phone] (Phone) [phone] (Fax)      Other comments (if applicable):   Call patient once rx has been placed

## 2016-09-29 NOTE — Telephone Encounter (Signed)
Spoke with patient informing her of your recomandation. Patient states that she has never picked this prescription up and was wondering why she couldn't because she knows you sent it it in at one point. I explained to the patient that if not picked up in a certain amount of days the prescription is put back. Also there is an expiration date on the prescription. Patient verbalized understanding and understands appointment is needed for further refills.

## 2016-09-29 NOTE — Telephone Encounter (Signed)
Please call patient and tell her that she needs to be seen for refill of this. She has an appointment next week. Can it wait? Also, I see that the request was sent 9 days ago, this is the first I've seen it, do you know what happened?

## 2016-10-06 ENCOUNTER — Encounter: Payer: Medicare Other | Admitting: Family Medicine

## 2017-01-14 ENCOUNTER — Ambulatory Visit (HOSPITAL_COMMUNITY)
Admission: EM | Admit: 2017-01-14 | Discharge: 2017-01-14 | Disposition: A | Discharge status: Home / Self Care | Payer: Medicare Other | Attending: Family Medicine | Admitting: Family Medicine

## 2017-01-14 ENCOUNTER — Encounter (HOSPITAL_COMMUNITY): Payer: Self-pay | Admitting: Family Medicine

## 2017-01-14 DIAGNOSIS — I10 Essential (primary) hypertension: Secondary | ICD-10-CM

## 2017-01-14 DIAGNOSIS — M10272 Drug-induced gout, left ankle and foot: Secondary | ICD-10-CM | POA: Diagnosis not present

## 2017-01-14 MED ORDER — POTASSIUM CHLORIDE 20 MEQ/15ML (10%) PO SOLN: ORAL | 5 refills | Status: DC

## 2017-01-14 MED ORDER — PREDNISONE 20 MG PO TABS
ORAL_TABLET | ORAL | 0 refills | Status: DC
Start: 2017-01-14 — End: 2017-02-02

## 2017-01-14 MED ORDER — LISINOPRIL 10 MG PO TABS: 10.0000 mg | ORAL_TABLET | Freq: Every day | ORAL | 3 refills | Status: DC

## 2017-01-14 NOTE — ED Triage Notes (Signed)
Pt here for gout flare up on left foot onset 3-4 days.   Sx include pain, burning and itching  Denies inj/trauma  A&O x4... NAD... Brought back on wheelchair.

## 2017-01-14 NOTE — ED Provider Notes (Signed)
[place]   [id number] 01/14/17 Arrival Time: 1207   SUBJECTIVE:  Jamie Salazar is a 56 y.o. female who presents to the urgent care with complaint of left foot pain. She believes this is a recurrence of gout which she's had before and has been treated successfully with prednisone.  Patient would also like her lisinopril and potassium refilled.  Patient has no history of diabetes.     Past Medical History:  Diagnosis Date  . Allergy    Zyrtec daily.  . Anxiety   . Arthritis    disability  . Depression   . Hypertension   . Obesity, morbid ([place])    Family History  Problem Relation Age of Onset  . Lupus Mother   . Diabetes Mother   . Hypertension Mother   . Heart disease Mother        stents placed 34.   Social History   Social History  . Marital status: Single    Spouse name: N/A  . Number of children: 0  . Years of education: N/A   Occupational History  . disability     OA, depression   Social History Main Topics  . Smoking status: Never Smoker  . Smokeless tobacco: Current User    Types: Snuff  . Alcohol use No  . Drug use: No  . Sexual activity: No   Other Topics Concern  . Not on file   Social History Narrative   Marital status: single; not dating and not interested.      Children: none      Lives: alone      Employment: disabled due to OA, depression/anxiety      Tobacco:           No outpatient prescriptions have been marked as taking for the 01/14/17 encounter [facility] Encounter).   Allergies  Allergen Reactions  . Ciprofloxacin Hcl     Hallucination       ROS: As per HPI, remainder of ROS negative.   OBJECTIVE:   There were no vitals filed for this visit.   General appearance: alert; no distress; morbidly obese Eyes: PERRL; EOMI; conjunctiva normal HENT: normocephalic; atraumatic; TMs normal, canal normal, external ears normal without trauma; nasal mucosa normal; oral mucosa normal Neck:  supple Lungs: clear to auscultation bilaterally Heart: regular rate and rhythm Back: no CVA tenderness Extremities: no cyanosis or edema; symmetrical with no gross deformities, tender left great toe MTP joint Skin: warm and dry Neurologic: normal gait; grossly normal Psychological: alert and cooperative; normal mood and affect      Labs:  Results for orders placed or performed in visit on 07/05/16  CBC  Result Value Ref Range   WBC 10.1 4.0 - 10.5 K/uL   RBC 5.40 (H) 3.87 - 5.11 Mil/uL   Platelets 258.0 150.0 - 400.0 K/uL   Hemoglobin 13.4 12.0 - 15.0 g/dL   HCT 41.2 36.0 - 46.0 %   MCV 76.3 (L) 78.0 - 100.0 fl   MCHC 32.6 30.0 - 36.0 g/dL   RDW 16.9 (H) 11.5 - 15.5 %  Comprehensive metabolic panel  Result Value Ref Range   Sodium 139 135 - 145 mEq/L   Potassium 3.7 3.5 - 5.1 mEq/L   Chloride 101 96 - 112 mEq/L   CO2 29 19 - 32 mEq/L   Glucose, Bld 125 (H) 70 - 99 mg/dL   BUN 20 6 - 23 mg/dL   Creatinine, Ser 1.10 0.40 - 1.20 mg/dL  Total Bilirubin 0.6 0.2 - 1.2 mg/dL   Alkaline Phosphatase 107 39 - 117 U/L   AST 20 0 - 37 U/L   ALT 20 0 - 35 U/L   Total Protein 9.1 (H) 6.0 - 8.3 g/dL   Albumin 4.4 3.5 - 5.2 g/dL   Calcium 10.6 (H) 8.4 - 10.5 mg/dL   GFR 65.83 >60.00 mL/min  Lipid panel  Result Value Ref Range   Cholesterol 183 0 - 200 mg/dL   Triglycerides 87.0 0.0 - 149.0 mg/dL   HDL 73.50 >39.00 mg/dL   VLDL 17.4 0.0 - 40.0 mg/dL   LDL Cholesterol 92 0 - 99 mg/dL   Total CHOL/HDL Ratio 2    NonHDL 109.46   TSH  Result Value Ref Range   TSH 2.25 0.35 - 4.50 uIU/mL  VITAMIN D 25 Hydroxy (Vit-D Deficiency, Fractures)  Result Value Ref Range   VITD 30.63 30.00 - 100.00 ng/mL    Labs Reviewed - No data to display  No results found.     ASSESSMENT & PLAN:  1. Acute drug-induced gout of left foot   2. Essential hypertension     Meds ordered this encounter  Medications  . lisinopril (PRINIVIL,ZESTRIL) 10 MG tablet    Sig: Take 1 tablet (10 mg  total) by mouth daily.    Dispense:  90 tablet    Refill:  3  . potassium chloride 20 MEQ/15ML (10%) SOLN    Sig: take 15 milliliters by mouth once daily    Dispense:  473 mL    Refill:  5    Please dispense QS for 30 day supply.  . predniSONE (DELTASONE) 20 MG tablet    Sig: Two daily with food    Dispense:  10 tablet    Refill:  0    Reviewed expectations re: course of current medical issues. Questions answered. Outlined signs and symptoms indicating need for more acute intervention. Patient verbalized understanding. After Visit Summary given.    Procedures:      Robyn Haber, MD 01/14/17 1250

## 2017-01-27 ENCOUNTER — Ambulatory Visit (HOSPITAL_COMMUNITY)
Admission: EM | Admit: 2017-01-27 | Discharge: 2017-01-27 | Disposition: A | Discharge status: Home / Self Care | Payer: Medicare Other | Attending: Family Medicine | Admitting: Family Medicine

## 2017-01-27 ENCOUNTER — Encounter (HOSPITAL_COMMUNITY): Payer: Self-pay

## 2017-01-27 DIAGNOSIS — M79674 Pain in right toe(s): Secondary | ICD-10-CM

## 2017-01-27 DIAGNOSIS — M7989 Other specified soft tissue disorders: Secondary | ICD-10-CM | POA: Diagnosis not present

## 2017-01-27 MED ORDER — NAPROXEN 375 MG PO TABS: 375.0000 mg | ORAL_TABLET | Freq: Two times a day (BID) | ORAL | 0 refills | Status: DC

## 2017-01-27 NOTE — ED Provider Notes (Signed)
[place]    CSN: [id number] Arrival date & time: 01/27/17  1348     History   Chief Complaint Chief Complaint  Patient presents with  . Foot Pain    HPI Jamie Salazar is a 56 y.o. female.   6 week history of gout symptoms. Burning sensation, itching, foot spasms. Right foot pain at great toe. Left foot feels the pain is in all toes, with erythema, swelling. States pain and swelling is worse with long standing hours. States has also had some pain shooting through the toes. Denies fever, but has had some chills. Denies URI symptoms such as cough, congestion, sore throat. Denies urinary symptoms such as frequency dysuria hematuria. Denies chest pain, shortness of breath, wheezing.   States she has had seafood, red meat, beer, which is usually triggers for her gout attacks.       Past Medical History:  Diagnosis Date  . Allergy    Zyrtec daily.  . Anxiety   . Arthritis    disability  . Depression   . Hypertension   . Obesity, morbid [facility])     Patient Active Problem List   Diagnosis Date Noted  . Venous stasis 08/09/2012  . Gout of big toe 09/27/2011  . Varicose veins 04/28/2011  . Mild hyperparathyroidism ([place]) 02/22/2011  . UNSPECIFIED EPISODIC MOOD DISORDER 07/15/2009  . MORBID OBESITY 05/20/2009  . DEPRESSION/ANXIETY 05/20/2009  . Essential hypertension 05/20/2009  . ALLERGIC RHINITIS 05/20/2009  . CONSTIPATION, CHRONIC 05/20/2009  . Osteoarthritis of both knees 05/20/2009    Past Surgical History:  Procedure Laterality Date  . CHOLECYSTECTOMY    . gallbladder      OB History    No data available       Home Medications    Prior to Admission medications   Medication Sig Start Date End Date Taking? Authorizing Provider  cetirizine (ZYRTEC HIVES RELIEF) 10 MG tablet     Yes [provider]  furosemide (LASIX) 40 MG tablet TAKE ONE TABLET BY MOUTH ONCE DAILY 07/05/16  Yes Elby Beck, FNP  lisinopril  (PRINIVIL,ZESTRIL) 10 MG tablet Take 1 tablet (10 mg total) by mouth daily. 01/14/17  Yes Robyn Haber, MD  Misc Natural Products [facility] ADVANCED) CAPS Take 2 capsules by mouth daily.   Yes [provider]  OVER THE COUNTER MEDICATION    Yes [provider]  potassium chloride 20 MEQ/15ML (10%) SOLN take 15 milliliters by mouth once daily 01/14/17  Yes Lauenstein, Synetta Shadow, MD  predniSONE (DELTASONE) 20 MG tablet Two daily with food 01/14/17  Yes Robyn Haber, MD  vitamin C (ASCORBIC ACID) 500 MG tablet Take 500 mg by mouth daily.   Yes [provider]  naproxen (NAPROSYN) 375 MG tablet Take 1 tablet (375 mg total) by mouth 2 (two) times daily. 01/27/17   Ok Edwards, PA-C    Family History Family History  Problem Relation Age of Onset  . Lupus Mother   . Diabetes Mother   . Hypertension Mother   . Heart disease Mother        stents placed 25.    Social History Social History  Substance Use Topics  . Smoking status: Never Smoker  . Smokeless tobacco: Current User    Types: Snuff  . Alcohol use No     Allergies   Ciprofloxacin hcl   Review of Systems Review of Systems  Reason unable to perform ROS: See HPI as above.     Physical  Exam Triage Vital Signs ED Triage Vitals  Enc Vitals Group     BP 01/27/17 1424 123/84     Pulse Rate 01/27/17 1424 97     Resp 01/27/17 1424 16     Temp 01/27/17 1424 98.1 F (36.7 C)     Temp Source 01/27/17 1424 Oral     SpO2 01/27/17 1424 97 %     Weight --      Height --      Head Circumference --      Peak Flow --      Pain Score 01/27/17 1431 10     Pain Loc --      Pain Edu? --      Excl. in [place]? --    No data found.   Updated Vital Signs BP 123/84 (BP Location: Left Arm)   Pulse 97   Temp 98.1 F (36.7 C) (Oral)   Resp 16   SpO2 97%     Physical Exam  Constitutional: She is oriented to person, place, and time. She appears well-developed and well-nourished. No distress.  HENT:    Head: Normocephalic and atraumatic.  Eyes: Pupils are equal, round, and reactive to light. Conjunctivae are normal.  Cardiovascular: Normal rate, regular rhythm and normal heart sounds.  Exam reveals no gallop and no friction rub.   No murmur heard. Pulmonary/Chest: Effort normal and breath sounds normal. She has no wheezes. She has no rales.  Musculoskeletal:  Right: Mild swelling of the right great toe with mild erythema, increased warmth. Tenderness to palpation of the 1st MTP joint. Full ROM of ankle and toes.   Left: Generalized swelling of the left foot, no erythema, increased warmth seen. No obvious tenderness on palpation through out. Patient states maybe with some tenderness on plantar surface.  Strength normal and equal bilaterally. Pedal pulses hard to obtain with swelling, cap refill <2s  Neurological: She is alert and oriented to person, place, and time.  Skin: Skin is warm and dry.     UC Treatments / Results  Labs (all labs ordered are listed, but only abnormal results are displayed) Labs Reviewed - No data to display  EKG  EKG Interpretation None       Radiology No results found.  Procedures Procedures (including critical care time)  Medications Ordered in UC Medications - No data to display   Initial Impression / Assessment and Plan / UC Course  I have reviewed the triage vital signs and the nursing notes.  Pertinent labs & imaging results that were available during my care of the patient were reviewed by me and considered in my medical decision making (see chart for details).     Possible inflammatory process vs gout flare. Start naproxen as directed. Ice compress with elevation. Compression stocking for swelling. Follow up with PCP in 1 week if symptoms do not resolve for further workup needed. Return precautions given.  Cause discussed with Dr Mannie Stabile, who agrees to plan.   Final Clinical Impressions(s) / UC Diagnoses   Final diagnoses:   Swelling of left foot  Great toe pain, right    New Prescriptions Discharge Medication List as of 01/27/2017  3:33 PM    START taking these medications   Details  naproxen (NAPROSYN) 375 MG tablet Take 1 tablet (375 mg total) by mouth 2 (two) times daily., Starting Sat 01/27/2017, Normal          Ok Edwards, [place] 01/27/17 1556

## 2017-01-27 NOTE — Discharge Instructions (Signed)
Symptoms could be due to inflammatory process. Take Naproxen as directed. Ice compress of left foot with elevation to help with swelling. You can get compression stockings from grocery stores to help with swelling. Monitor for any worsening of symptoms, changes in foot color, fever, follow up here or with PCP for further evaluation. Otherwise follow up with PCP in 1 week for further evaluation and treatment needed.

## 2017-01-27 NOTE — ED Triage Notes (Signed)
Pt presents today with bilateral foot pain and swelling. States she was diagnosed with gout at the urgent care on [address]. Has had issues with gout in the past. Was seen here within the last couple of weeks and the pain and swelling has persist and now in both feet.

## 2017-01-30 ENCOUNTER — Other Ambulatory Visit: Payer: Self-pay | Admitting: Internal Medicine

## 2017-01-30 DIAGNOSIS — Z1231 Encounter for screening mammogram for malignant neoplasm of breast: Secondary | ICD-10-CM

## 2017-02-02 ENCOUNTER — Encounter: Payer: Self-pay | Admitting: Family Medicine

## 2017-02-02 ENCOUNTER — Ambulatory Visit (INDEPENDENT_AMBULATORY_CARE_PROVIDER_SITE_OTHER): Payer: Medicare Other | Admitting: Family Medicine

## 2017-02-02 VITALS — BP 126/70 | HR 97 | Temp 97.8°F | Wt 341.2 lb

## 2017-02-02 DIAGNOSIS — M79671 Pain in right foot: Secondary | ICD-10-CM

## 2017-02-02 DIAGNOSIS — M1A0790 Idiopathic chronic gout, unspecified ankle and foot, without tophus (tophi): Secondary | ICD-10-CM

## 2017-02-02 DIAGNOSIS — Z23 Encounter for immunization: Secondary | ICD-10-CM

## 2017-02-02 DIAGNOSIS — M79672 Pain in left foot: Secondary | ICD-10-CM

## 2017-02-02 NOTE — Progress Notes (Signed)
   Subjective:    Patient ID: Jamie Salazar, female    DOB: 1960-06-28, 56 y.o.   MRN: [id number]  HPI This is a 56 yo female who presents today to follow up recent foot pain. Was seen at [facility] 01/14/17 and again on 01/27/17 for left foot swelling, bilateral foot pain. She was initially treated for gout with prednisone 20 mg x 5 days with some improvement of left foot pain/redness and swelling. She then developed pain in right foot, bottom and top. Worse with walking/standing. Was given naproxen which has helped.   She has been trying to make healthier food choices and eliminate soda. She is contemplating bariatric surgery. Her mother recently had a stroke but has had a good recovery.   Past Medical History:  Diagnosis Date  . Allergy    Zyrtec daily.  . Anxiety   . Arthritis    disability  . Depression   . Hypertension   . Obesity, morbid [facility])    Past Surgical History:  Procedure Laterality Date  . CHOLECYSTECTOMY    . gallbladder     Family History  Problem Relation Age of Onset  . Lupus Mother   . Diabetes Mother   . Hypertension Mother   . Heart disease Mother        stents placed 39.   Social History  Substance Use Topics  . Smoking status: Never Smoker  . Smokeless tobacco: Current User    Types: Snuff  . Alcohol use No      Review of Systems Per HPI    Objective:   Physical Exam  Constitutional: She appears well-developed and well-nourished. No distress.  Morbidly obese.   HENT:  Head: Normocephalic and atraumatic.  Eyes: Conjunctivae are normal.  Cardiovascular: Normal rate.   Pulmonary/Chest: Effort normal.  Musculoskeletal: She exhibits edema (1+).       Right foot: There is tenderness (plantar, 1st MCP joint). There is normal range of motion and no swelling.       Left foot: There is tenderness (1st MCP) and swelling (mild generalized).  Bilateral feet with large amount flaking, dry skin. No erythema or drainage.   Skin: She is not  diaphoretic.  Vitals reviewed.    BP 126/70   Pulse 97   Temp 97.8 F (36.6 C) (Oral)   Wt (!) 341 lb 4 oz (154.8 kg)   SpO2 97%   BMI 62.40 kg/m  Wt Readings from Last 3 Encounters:  02/02/17 (!) 341 lb 4 oz (154.8 kg)  07/05/16 (!) 360 lb (163.3 kg)  10/12/15 (!) 347 lb (157.4 kg)       Assessment & Plan:  1. Pain in both feet - continue naprosyn, ice, elevate when sitting, may need allopurinol, will check uric acid level - Hemoglobin A1c - Uric acid - Comprehensive metabolic panel - CBC  2. Morbid obesity ([place]) - discussed pros and cons of weight loss surgery and encouraged her to contact [facility] to attend an information session - she has had a 20 pound weight loss, encouraged her to continue making healthy food choices and stay as active as possible - Hemoglobin A1c - Comprehensive metabolic panel - CBC  3. Need for influenza vaccination - Flu Vaccine QUAD 6+ mos PF IM (Fluarix Quad PF)   Clarene Reamer, FNP-BC  [place] [facility], [place] [facility]  02/06/2017 8:47 AM

## 2017-02-02 NOTE — Patient Instructions (Addendum)
It was a pleasure to meet you today  Unitl you are able to walk more, do chair exercises  I will call you about your labs and whether or not we need to add a gout medication  If you are interested, call [facility] about weight loss surgery

## 2017-02-03 LAB — COMPREHENSIVE METABOLIC PANEL
AG Ratio: 0.9 (calc) — ABNORMAL LOW (ref 1.0–2.5)
ALBUMIN MSPROF: 4 g/dL (ref 3.6–5.1)
ALT: 16 U/L (ref 6–29)
AST: 18 U/L (ref 10–35)
Alkaline phosphatase (APISO): 114 U/L (ref 33–130)
BUN: 14 mg/dL (ref 7–25)
CO2: 30 mmol/L (ref 20–32)
Calcium: 9.9 mg/dL (ref 8.6–10.4)
Chloride: 101 mmol/L (ref 98–110)
Creat: 1.05 mg/dL (ref 0.50–1.05)
GLUCOSE: 140 mg/dL — AB (ref 65–99)
Globulin: 4.3 g/dL (calc) — ABNORMAL HIGH (ref 1.9–3.7)
Potassium: 4.5 mmol/L (ref 3.5–5.3)
SODIUM: 141 mmol/L (ref 135–146)
TOTAL PROTEIN: 8.3 g/dL — AB (ref 6.1–8.1)
Total Bilirubin: 0.6 mg/dL (ref 0.2–1.2)

## 2017-02-03 LAB — CBC
HEMATOCRIT: 40.6 % (ref 35.0–45.0)
HEMOGLOBIN: 13.2 g/dL (ref 11.7–15.5)
MCH: 24.3 pg — AB (ref 27.0–33.0)
MCHC: 32.5 g/dL (ref 32.0–36.0)
MCV: 74.6 fL — AB (ref 80.0–100.0)
MPV: 10.9 fL (ref 7.5–12.5)
Platelets: 321 10*3/uL (ref 140–400)
RBC: 5.44 10*6/uL — AB (ref 3.80–5.10)
RDW: 14.6 % (ref 11.0–15.0)
WBC: 11.6 10*3/uL — ABNORMAL HIGH (ref 3.8–10.8)

## 2017-02-03 LAB — HEMOGLOBIN A1C
EAG (MMOL/L): 8.2 (calc)
HEMOGLOBIN A1C: 6.8 %{Hb} — AB (ref <5.7)
Mean Plasma Glucose: 148 (calc)

## 2017-02-03 LAB — URIC ACID: Uric Acid, Serum: 9.6 mg/dL — ABNORMAL HIGH (ref 2.5–7.0)

## 2017-02-06 ENCOUNTER — Telehealth: Payer: Self-pay

## 2017-02-06 NOTE — Telephone Encounter (Signed)
Pt left v/m; thought was to get lab results on 02/05/17; request cb with lab results.

## 2017-02-07 ENCOUNTER — Other Ambulatory Visit: Payer: Self-pay | Admitting: Emergency Medicine

## 2017-02-07 MED ORDER — ALLOPURINOL 100 MG PO TABS: ORAL_TABLET | ORAL | 2 refills | Status: DC

## 2017-02-07 NOTE — Telephone Encounter (Signed)
Sent call back information in lab result note.

## 2017-02-07 NOTE — Addendum Note (Signed)
Addended by: Clarene Reamer B on: 02/07/2017 01:43 PM   Modules accepted: Orders

## 2017-02-07 NOTE — Telephone Encounter (Signed)
Patient aware of results and recommendations nothing further needed at this time.

## 2017-02-07 NOTE — Telephone Encounter (Signed)
Best number [phone]  Pt called to get her lab results

## 2017-02-08 ENCOUNTER — Telehealth: Payer: Self-pay | Admitting: Family Medicine

## 2017-02-08 NOTE — Telephone Encounter (Signed)
Left pt message asking to call Ebony Hail back directly at [phone] to schedule AWV + labs with Katha Cabal and CPE with PCP.  *NOTE* No hx of AWV Started Medicare in 2012

## 2017-02-21 ENCOUNTER — Ambulatory Visit: Payer: Medicare Other

## 2017-03-20 NOTE — Telephone Encounter (Signed)
Left pt message asking to call Ebony Hail back directly at [phone] to schedule AWV + labs with Katha Cabal and CPE with PCP.  *NOTE* No hx of AWV Started Medicare in 2012

## 2017-03-22 NOTE — Telephone Encounter (Signed)
Spoke to pt. Pt states she has already had AWV completed by [facility] nurse. Will call back in 2019 to set up in office

## 2017-05-07 ENCOUNTER — Telehealth: Payer: Self-pay

## 2017-05-07 ENCOUNTER — Other Ambulatory Visit: Payer: Self-pay | Admitting: Family Medicine

## 2017-05-07 DIAGNOSIS — M170 Bilateral primary osteoarthritis of knee: Secondary | ICD-10-CM

## 2017-05-07 DIAGNOSIS — M109 Gout, unspecified: Secondary | ICD-10-CM

## 2017-05-07 DIAGNOSIS — M79672 Pain in left foot: Secondary | ICD-10-CM

## 2017-05-07 DIAGNOSIS — M7989 Other specified soft tissue disorders: Secondary | ICD-10-CM

## 2017-05-07 DIAGNOSIS — M79671 Pain in right foot: Secondary | ICD-10-CM

## 2017-05-07 NOTE — Progress Notes (Signed)
Per patient request, I have placed referral to Dr. Trudie Reed, rheumatology.

## 2017-05-07 NOTE — Telephone Encounter (Signed)
I spoke with pt; pt continues with bilateral foot pain and swelling, itching and burning; both feet the skin on feet are peeling. Pt request rheumatology referral at [facility] rheumatology, Dr Berna Bue.Please advise. Pt last seen 02/02/17.

## 2017-05-07 NOTE — Telephone Encounter (Signed)
Called and spoke with patient informing her that referral has been placed and she should be receiving a call within 7-10 business days. Understanding verbalized nothing further needed at this time.

## 2017-05-07 NOTE — Telephone Encounter (Signed)
Copied from [place] [phone]. Topic: Referral - Request >> May 07, 2017  9:10 AM [place], Laban Emperor wrote: Reason for CRM: Pt would like Carlean Purl to put an referral in for a rheumatologist. Please f/u with pt.

## 2017-05-07 NOTE — Telephone Encounter (Signed)
Please call patient and tell her that I have placed a referral to rheumatology as she requested.

## 2017-05-16 ENCOUNTER — Telehealth: Payer: Self-pay | Admitting: Family Medicine

## 2017-05-16 DIAGNOSIS — I10 Essential (primary) hypertension: Secondary | ICD-10-CM

## 2017-05-16 MED ORDER — FUROSEMIDE 40 MG PO TABS: ORAL_TABLET | ORAL | 0 refills | Status: DC

## 2017-05-16 NOTE — Telephone Encounter (Signed)
Request for Lasix refill; last OV 02/02/17; last BMP; 02/02/17.  Refill completed for Lasix 40 mg; # 90; no refills, as will need to have BMP repeated at the 6 mo. interval.

## 2017-05-16 NOTE — Telephone Encounter (Signed)
Copied from [place]. Topic: Quick Communication - See Telephone Encounter >> May 16, 2017 10:42 AM Hewitt Shorts wrote: CRM for notification. See Telephone encounter for: pt is requesting a refill on her lasix and she states that she called the pharmacy but do not see a request in system  Best number [phone]  [facility] [address] [facility] -[phone]  05/16/17.

## 2017-05-27 ENCOUNTER — Other Ambulatory Visit: Payer: Self-pay | Admitting: Family Medicine

## 2017-05-27 DIAGNOSIS — M1A0790 Idiopathic chronic gout, unspecified ankle and foot, without tophus (tophi): Secondary | ICD-10-CM

## 2017-06-27 ENCOUNTER — Ambulatory Visit: Payer: Medicare Other | Admitting: Family Medicine

## 2017-06-29 ENCOUNTER — Ambulatory Visit: Payer: Medicare Other | Admitting: Family Medicine

## 2017-08-01 ENCOUNTER — Other Ambulatory Visit: Payer: Self-pay | Admitting: Family Medicine

## 2017-08-01 DIAGNOSIS — M1A0790 Idiopathic chronic gout, unspecified ankle and foot, without tophus (tophi): Secondary | ICD-10-CM

## 2017-08-01 DIAGNOSIS — I10 Essential (primary) hypertension: Secondary | ICD-10-CM

## 2017-08-01 MED ORDER — ALLOPURINOL 100 MG PO TABS
ORAL_TABLET | ORAL | 1 refills | Status: DC
Start: 2017-08-01 — End: 2020-04-13

## 2017-10-27 ENCOUNTER — Other Ambulatory Visit: Payer: Self-pay | Admitting: Family Medicine

## 2017-10-27 DIAGNOSIS — I10 Essential (primary) hypertension: Secondary | ICD-10-CM

## 2017-10-29 NOTE — Telephone Encounter (Signed)
See phone note

## 2017-10-29 NOTE — Telephone Encounter (Signed)
Patient needs OV. 30 day supply of lasix sent in. Please call her and schedule follow up.

## 2017-10-29 NOTE — Telephone Encounter (Signed)
Pt called stating she missed a call. Please call back on [phone].

## 2017-10-30 NOTE — Telephone Encounter (Signed)
Called and left Vm for pt to return call to office.  

## 2018-02-11 ENCOUNTER — Other Ambulatory Visit: Payer: Self-pay | Admitting: Family Medicine

## 2018-02-11 ENCOUNTER — Ambulatory Visit (INDEPENDENT_AMBULATORY_CARE_PROVIDER_SITE_OTHER): Payer: Medicare Other | Admitting: Family Medicine

## 2018-02-11 VITALS — BP 132/88 | HR 99 | Temp 97.7°F | Ht 62.0 in | Wt 328.8 lb

## 2018-02-11 DIAGNOSIS — I10 Essential (primary) hypertension: Secondary | ICD-10-CM | POA: Diagnosis not present

## 2018-02-11 DIAGNOSIS — M79672 Pain in left foot: Secondary | ICD-10-CM

## 2018-02-11 DIAGNOSIS — Z23 Encounter for immunization: Secondary | ICD-10-CM

## 2018-02-11 DIAGNOSIS — Z1231 Encounter for screening mammogram for malignant neoplasm of breast: Secondary | ICD-10-CM

## 2018-02-11 DIAGNOSIS — M79671 Pain in right foot: Secondary | ICD-10-CM | POA: Diagnosis not present

## 2018-02-11 LAB — BASIC METABOLIC PANEL
BUN: 15 mg/dL (ref 6–23)
CO2: 29 mEq/L (ref 19–32)
CREATININE: 1.2 mg/dL (ref 0.40–1.20)
Calcium: 11.1 mg/dL — ABNORMAL HIGH (ref 8.4–10.5)
Chloride: 100 mEq/L (ref 96–112)
GFR: 59.21 mL/min — ABNORMAL LOW (ref >60.00)
GLUCOSE: 113 mg/dL — AB (ref 70–99)
Potassium: 4 mEq/L (ref 3.5–5.1)
Sodium: 139 mEq/L (ref 135–145)

## 2018-02-11 LAB — LIPID PANEL
CHOL/HDL RATIO: 2
Cholesterol: 171 mg/dL (ref 0–200)
HDL: 71 mg/dL (ref >39.00)
LDL CALC: 86 mg/dL (ref 0–99)
NONHDL: 99.7
TRIGLYCERIDES: 70 mg/dL (ref 0.0–149.0)
VLDL: 14 mg/dL (ref 0.0–40.0)

## 2018-02-11 LAB — HEMOGLOBIN A1C: Hgb A1c MFr Bld: 5.8 % (ref 4.6–6.5)

## 2018-02-11 LAB — TSH: TSH: 3.87 u[IU]/mL (ref 0.35–4.50)

## 2018-02-11 MED ORDER — POTASSIUM CHLORIDE 20 MEQ/15ML (10%) PO SOLN: ORAL | 11 refills | Status: DC

## 2018-02-11 MED ORDER — FUROSEMIDE 40 MG PO TABS: 40.0000 mg | ORAL_TABLET | Freq: Every day | ORAL | 1 refills | Status: DC

## 2018-02-11 MED ORDER — LISINOPRIL 10 MG PO TABS: 10.0000 mg | ORAL_TABLET | Freq: Every day | ORAL | 3 refills | Status: DC

## 2018-02-11 NOTE — Patient Instructions (Addendum)
Please call and schedule an appointment for screening mammogram. A referral is not needed.  [place]  Please follow up in 6 months

## 2018-02-11 NOTE — Progress Notes (Signed)
Subjective:    Patient ID: Jamie Salazar, female    DOB: 11-12-1960, 57 y.o.   MRN: [id number]  HPI This is a 57 yo female who presents today for follow up of HTN, foot pain, obesity.   HTN- tolerating lisinopril, prn furosemide, taking potassium.   Obesity- Has been watching her diet. Drinking pineapple juice for her gout. Has cut out soda. She is down 32 pounds since 3/18.   Foot pain- improved. Wearing support hose.   Her mother is in rehab. This has given her a little bit of a break, but unsure what will be next steps for her living situation.   Mood has been good.   Past Medical History:  Diagnosis Date  . Allergy    Zyrtec daily.  . Anxiety   . Arthritis    disability  . Depression   . Hypertension   . Obesity, morbid [facility])    Past Surgical History:  Procedure Laterality Date  . CHOLECYSTECTOMY    . gallbladder     Family History  Problem Relation Age of Onset  . Lupus Mother   . Diabetes Mother   . Hypertension Mother   . Heart disease Mother        stents placed 78.   Social History   Tobacco Use  . Smoking status: Never Smoker  . Smokeless tobacco: Current User    Types: Snuff  Substance Use Topics  . Alcohol use: No  . Drug use: No      Review of Systems Per HPI    Objective:   Physical Exam Physical Exam  Constitutional: Oriented to person, place, and time. She appears well-developed and well-nourished.  HENT:  Head: Normocephalic and atraumatic.  Eyes: Conjunctivae are normal.  Neck: Normal range of motion. Neck supple.  Cardiovascular: Normal rate, regular rhythm and normal heart sounds.   Pulmonary/Chest: Effort normal and breath sounds normal.  Musculoskeletal: Minimal LE edema.   Neurological: Alert and oriented to person, place, and time.  Skin: Skin is warm and dry.  Psychiatric: Normal mood and affect. Behavior is normal. Judgment and thought content normal.  Vitals reviewed.     BP 132/88 (BP Location: Right  Arm, Patient Position: Sitting, Cuff Size: Large)   Pulse 99   Temp 97.7 F (36.5 C) (Oral)   Ht 5\' 2"  (1.575 m)   Wt (!) 328 lb 12.8 oz (149.1 kg)   SpO2 98%   BMI 60.14 kg/m  Wt Readings from Last 3 Encounters:  02/11/18 (!) 328 lb 12.8 oz (149.1 kg)  02/02/17 (!) 341 lb 4 oz (154.8 kg)  07/05/16 (!) 360 lb (163.3 kg)       Assessment & Plan:  1. Essential hypertension - well controlled, encouraged continued weight loss - furosemide (LASIX) 40 MG tablet; Take 1 tablet (40 mg total) by mouth daily.  Dispense: 60 tablet; Refill: 1 - lisinopril (PRINIVIL,ZESTRIL) 10 MG tablet; Take 1 tablet (10 mg total) by mouth daily.  Dispense: 90 tablet; Refill: 3 - potassium chloride 20 MEQ/15ML (10%) SOLN; take 15 milliliters by mouth once daily  Dispense: 473 mL; Refill: 11 - Basic Metabolic Panel - Lipid Panel  2. Pain in both feet - improved, encouraged continued activity as tolerated, support hose, elevate when sitting  3. Morbid obesity ([place]) - she is making slow progress and I encouraged her to continue, we discussed eating enough protein and vegetables, avoiding excess sugar/starches - TSH - Hemoglobin A1c - Lipid Panel  4. Need  for influenza vaccination - Flu Vaccine QUAD 36+ mos IM  - she was given contact information for mammogram and encouraged to schedule an appointment - follow up in 6 months Clarene Reamer, FNP-BC  [place] [facility], [place] [facility]  02/14/2018 6:45 AM

## 2018-02-13 ENCOUNTER — Encounter: Payer: Self-pay | Admitting: Family Medicine

## 2018-02-14 ENCOUNTER — Encounter: Payer: Self-pay | Admitting: Family Medicine

## 2018-02-15 ENCOUNTER — Ambulatory Visit
Admission: RE | Admit: 2018-02-15 | Discharge: 2018-02-15 | Disposition: A | Discharge status: Home / Self Care | Payer: Medicare Other | Source: Ambulatory Visit | Attending: Family Medicine | Admitting: Family Medicine

## 2018-02-15 DIAGNOSIS — Z1231 Encounter for screening mammogram for malignant neoplasm of breast: Secondary | ICD-10-CM

## 2018-05-12 ENCOUNTER — Other Ambulatory Visit: Payer: Self-pay | Admitting: Family Medicine

## 2018-05-12 DIAGNOSIS — I10 Essential (primary) hypertension: Secondary | ICD-10-CM

## 2018-08-14 ENCOUNTER — Ambulatory Visit: Payer: Medicare Other | Admitting: Family Medicine

## 2018-11-13 ENCOUNTER — Encounter: Payer: Self-pay | Admitting: Family Medicine

## 2018-11-13 ENCOUNTER — Ambulatory Visit (INDEPENDENT_AMBULATORY_CARE_PROVIDER_SITE_OTHER): Payer: Medicare Other | Admitting: Family Medicine

## 2018-11-13 DIAGNOSIS — J309 Allergic rhinitis, unspecified: Secondary | ICD-10-CM | POA: Diagnosis not present

## 2018-11-13 DIAGNOSIS — I10 Essential (primary) hypertension: Secondary | ICD-10-CM | POA: Diagnosis not present

## 2018-11-13 MED ORDER — FUROSEMIDE 40 MG PO TABS: 40.0000 mg | ORAL_TABLET | Freq: Every day | ORAL | 1 refills | Status: DC

## 2018-11-13 NOTE — Progress Notes (Signed)
Virtual Visit via Video Note  I connected with Jamie Salazar on 11/13/18 at 10:30 AM EDT by a video enabled telemedicine application and verified that I am speaking with the correct person using two identifiers.  Location: Patient: In her home Provider: [name]   I discussed the limitations of evaluation and management by telemedicine and the availability of in person appointments. The patient expressed understanding and agreed to proceed.  History of Present Illness: This is a 58 yo female who requests virtual visit today to discuss sneezing, cough, watery eyes, nasal drainage and post nasal drainage. Has been ongoing for months. Has tried cough syrup and allergy med (Zyrtec- intermittently for awhile). The cough has improved with cough medicine. Drainage is clear, occasionally with streak of blood. Clear, watery eye drainage with sneezing. No SOB, wheeze.   Past Medical History:  Diagnosis Date  . Allergy    Zyrtec daily.  . Anxiety   . Arthritis    disability  . Depression   . Hypertension   . Obesity, morbid [facility])    Past Surgical History:  Procedure Laterality Date  . CHOLECYSTECTOMY    . gallbladder     Family History  Problem Relation Age of Onset  . Lupus Mother   . Diabetes Mother   . Hypertension Mother   . Heart disease Mother        stents placed 50.  . Breast cancer Neg Hx    Social History   Tobacco Use  . Smoking status: Never Smoker  . Smokeless tobacco: Current User    Types: Snuff  Substance Use Topics  . Alcohol use: No  . Drug use: No      Observations/Objective: The patient is obese.  She is alert and answers questions appropriately.  She is normally conversive without shortness of breath, audible wheeze or witnessed cough.  Visible skin is unremarkable.  Mood and affect are appropriate.  There were no vitals taken for this visit. Wt Readings from Last 3 Encounters:  02/11/18 (!) 328 lb 12.8 oz (149.1 kg)  02/02/17 (!)  341 lb 4 oz (154.8 kg)  07/05/16 (!) 360 lb (163.3 kg)   BP Readings from Last 3 Encounters:  02/11/18 132/88  02/02/17 126/70  01/27/17 123/84    Assessment and Plan: 1. Allergic rhinitis, unspecified seasonality, unspecified trigger -Discussed diagnosis with patient, she has longstanding history of allergies.  She has been on Zyrtec for a long time and I recommended that she switch to Claritin or Allegra.  Discussed nasal steroid spray but she prefers not to try this.  She did agree to use her steam machine at least once a day. -Discussed using over-the-counter allergy eyedrops for itchy, watery eyes. -Return to clinic precautions reviewed  2. Essential hypertension -She is overdue for office visit and she agreed to have office visit with labs next month - furosemide (LASIX) 40 MG tablet; Take 1 tablet (40 mg total) by mouth daily.  Dispense: 90 tablet; Refill: [place], FNP-BC  [facility], [place] [facility]  11/13/2018 10:41 AM   Follow Up Instructions:    I discussed the assessment and treatment plan with the patient. The patient was provided an opportunity to ask questions and all were answered. The patient agreed with the plan and demonstrated an understanding of the instructions.   The patient was advised to call back or seek an in-person evaluation if the symptoms worsen or if the condition  fails to improve as anticipated.    Elby Beck, FNP

## 2018-11-13 NOTE — Patient Instructions (Signed)
Hi Jamie Salazar,   It was good to see you today for your virtual visit.  As we discussed, I think your symptoms are all related to allergies.  I recommend you change your Zyrtec to a different antihistamine for a while.  You can take generic Allegra or generic Claritin.  Take daily while you are having symptoms.  Use your steam machine at least once a day and can use it more if needed.  Drink enough water to make your urine light yellow.  For your itchy, watery eyes, you can use over-the-counter allergy eyedrops.    Please call the office and schedule a visit for next month to have your blood work done and to check your blood pressure.  Take care and hope you are feeling better soon!  [name]

## 2019-02-19 ENCOUNTER — Other Ambulatory Visit: Payer: Self-pay

## 2019-02-19 DIAGNOSIS — I10 Essential (primary) hypertension: Secondary | ICD-10-CM

## 2019-02-19 MED ORDER — POTASSIUM CHLORIDE 20 MEQ/15ML (10%) PO SOLN: ORAL | 11 refills | Status: DC

## 2019-02-19 MED ORDER — LISINOPRIL 10 MG PO TABS: 10.0000 mg | ORAL_TABLET | Freq: Every day | ORAL | 3 refills | Status: DC

## 2019-02-19 NOTE — Telephone Encounter (Signed)
Refill request from pharmacy. Last filled on 02/11/2018 #455ml 11 refills. LOV 11/13/2018 virtual. No future appointments.

## 2019-04-04 ENCOUNTER — Other Ambulatory Visit: Payer: Self-pay | Admitting: *Deleted

## 2019-04-04 DIAGNOSIS — I10 Essential (primary) hypertension: Secondary | ICD-10-CM

## 2019-04-04 MED ORDER — FUROSEMIDE 40 MG PO TABS: 40.0000 mg | ORAL_TABLET | Freq: Every day | ORAL | 0 refills | Status: DC

## 2019-09-01 ENCOUNTER — Other Ambulatory Visit: Payer: Self-pay | Admitting: Family Medicine

## 2019-09-01 DIAGNOSIS — I10 Essential (primary) hypertension: Secondary | ICD-10-CM

## 2019-09-02 ENCOUNTER — Other Ambulatory Visit: Payer: Self-pay | Admitting: Family Medicine

## 2019-09-02 DIAGNOSIS — I10 Essential (primary) hypertension: Secondary | ICD-10-CM

## 2019-10-01 ENCOUNTER — Other Ambulatory Visit: Payer: Self-pay | Admitting: Family Medicine

## 2019-10-01 DIAGNOSIS — I10 Essential (primary) hypertension: Secondary | ICD-10-CM

## 2020-02-26 ENCOUNTER — Other Ambulatory Visit: Payer: Self-pay | Admitting: Family Medicine

## 2020-02-26 DIAGNOSIS — I10 Essential (primary) hypertension: Secondary | ICD-10-CM

## 2020-02-27 NOTE — Telephone Encounter (Signed)
lvm pt need to schedule ov first and then I will refill medication for 30 days.

## 2020-03-01 ENCOUNTER — Other Ambulatory Visit: Payer: Self-pay | Admitting: Family Medicine

## 2020-03-01 ENCOUNTER — Telehealth: Payer: Self-pay | Admitting: Family Medicine

## 2020-03-01 DIAGNOSIS — I10 Essential (primary) hypertension: Secondary | ICD-10-CM

## 2020-03-01 NOTE — Telephone Encounter (Signed)
Pt called in wanted to get her labs sent to [facility] for her physical  -[place], [place],  [address]  Phone: [phone]

## 2020-03-01 NOTE — Telephone Encounter (Signed)
Pt called in to set up for her physical on 12/27 and she needs enough to last until then.

## 2020-03-01 NOTE — Telephone Encounter (Signed)
Please advise 

## 2020-03-03 ENCOUNTER — Other Ambulatory Visit: Payer: Self-pay | Admitting: Family Medicine

## 2020-03-03 DIAGNOSIS — I10 Essential (primary) hypertension: Secondary | ICD-10-CM

## 2020-03-03 DIAGNOSIS — K5909 Other constipation: Secondary | ICD-10-CM

## 2020-03-03 DIAGNOSIS — R7303 Prediabetes: Secondary | ICD-10-CM

## 2020-03-03 NOTE — Telephone Encounter (Signed)
Please call patient let her know that I have put in her lab orders for her to have her blood drawn at [facility].

## 2020-03-04 ENCOUNTER — Other Ambulatory Visit: Payer: Self-pay | Admitting: Family Medicine

## 2020-03-04 DIAGNOSIS — I10 Essential (primary) hypertension: Secondary | ICD-10-CM

## 2020-03-04 NOTE — Telephone Encounter (Signed)
Called pt to let her know her orders are now in for bloodwork at [facility]. Provided pt with telephone to call [facility].  Pt wanted to reschedule CPE for earlier date.  I checked Debbie's schedule and didn't see an earlier date available for a CPE. Pt aware Pt inquiring abt lisinopril medication, pt states only received a 30 day supply. I called pt's pharmacy and pt can pick up refill of 90 tablets on 03/24/2020. Lvm

## 2020-03-16 ENCOUNTER — Other Ambulatory Visit: Payer: Self-pay | Admitting: Family Medicine

## 2020-03-16 DIAGNOSIS — I10 Essential (primary) hypertension: Secondary | ICD-10-CM

## 2020-03-17 NOTE — Telephone Encounter (Signed)
Pharmacy requests refill on: Potassium Chlor 10% Liq  LAST REFILL: 02/19/2019 LAST OV: 11/13/2018 NEXT OV: 04/19/2020 PHARMACY: [id number] [place], [place] requests refill on: Furosemide 40 mg    LAST REFILL: 09/02/2019 LAST OV: 11/13/2018 NEXT OV: 04/19/2020 PHARMACY: [id number] [place], [place]

## 2020-04-12 ENCOUNTER — Telehealth (INDEPENDENT_AMBULATORY_CARE_PROVIDER_SITE_OTHER): Payer: Medicare Other | Admitting: Family Medicine

## 2020-04-12 ENCOUNTER — Encounter: Payer: Self-pay | Admitting: Family Medicine

## 2020-04-12 VITALS — Wt 320.0 lb

## 2020-04-12 DIAGNOSIS — J309 Allergic rhinitis, unspecified: Secondary | ICD-10-CM

## 2020-04-12 DIAGNOSIS — R7303 Prediabetes: Secondary | ICD-10-CM

## 2020-04-12 DIAGNOSIS — M109 Gout, unspecified: Secondary | ICD-10-CM

## 2020-04-12 DIAGNOSIS — I10 Essential (primary) hypertension: Secondary | ICD-10-CM

## 2020-04-12 NOTE — Patient Instructions (Signed)
Hi Jamie Salazar,  It was a pleasure to see you today for your virtual visit. I am so sorry to hear about your mom's recent illness will be out of the hospital soon. As we discussed, sometimes your body can get accustomed to an antihistamine.  I suggest you change from cetirizine to fexofenadine or loratadine (generic over-the-counter for Allegra or Claritin) as well as use the fluticasone nasal spray that I have sent a prescription for to your pharmacy. Please use the nasal spray, 2 sprays in each nostril, nightly for 3 to 7 days.  I am hoping you will have improvement of your symptoms by then.  You can continue to use or then you can switch to using it on an as needed basis.

## 2020-04-12 NOTE — Progress Notes (Signed)
Virtual Visit via Video Note  I connected with Jamie Salazar on 04/12/20 at  9:45 AM EST by a video enabled telemedicine application and verified that I am speaking with the correct person using two identifiers.  Location: Patient: In her home Provider: [place] Persons participating in virtual visit: patient, provider   I discussed the limitations of evaluation and management by telemedicine and the availability of in person appointments. The patient expressed understanding and agreed to proceed.  History of Present Illness: Chief Complaint  Patient presents with  . Follow-up  . Medication Refill   This is a 59 yo female who presents today for virtual visit for follow up of chronic medical conditions. She  has a past medical history of Allergy, Anxiety, Arthritis, Depression, Hypertension, and Obesity, morbid ([place]). She is due to come into the office but reports that she was unable to come in for an in person visit due to nasal congestion. She has been doing ok, her mother, with whom she lives, has been hospitalized recently. She was briefly discharged to a SNF but then transferred back to the hospital.   Nasal congestion- patient reports water eyes, sneezing, post nasal drainage, sinus pressure without headache, cough with small amount phlegm production. She denies fever, SOB, wheeze. She has been taking long term cetirizine, doesn't think it helps as much as it used to. She has had 2 doses Covid vaccine, due booster.   Gout- requests refill of allopurinol. Has been off for a long time. Has occasional gout flare in foot. She can generally avoid flares with watching diet and avoiding triggers.   HTN- denies side effects of medication, lisinopril, furosemide. She is overdue labs and these were ordered last month in anticipation of follow up visit. Patient has not been able to have drawn, plans to go to [employer] [facility] [facility] draw location.    Observations/Objective: Patient is alert  and answers questions appropriately. Visible skin is unremarkable. Audible nasal congestion. She is normally conversive without increased work of breathing. No audible wheeze or witnessed cough. Mood and affect are appropriate.  Wt (!) 320 lb (145.2 kg)   BMI 58.53 kg/m  Wt Readings from Last 3 Encounters:  04/12/20 (!) 320 lb (145.2 kg)  02/11/18 (!) 328 lb 12.8 oz (149.1 kg)  02/02/17 (!) 341 lb 4 oz (154.8 kg)    Assessment and Plan: 1. Essential hypertension - overdue for office visit and labs. Encouraged patient to have labs drawn and follow up for in person visit, BP check - Comprehensive metabolic panel; Future - furosemide (LASIX) 40 MG tablet; Take 1 tablet (40 mg total) by mouth daily.  Dispense: 90 tablet; Refill: 0  2. Gout of big toe - occasional flares, discussed allopurinol use and since she is not having frequent episodes, will provide small amount colchicine for prn use, continue trigger avoidance - colchicine 0.6 MG tablet; At gout onset, take two pills. May take one more in 1 hour. No more than 3 pills in 72 hours (3 days).  Dispense: 6 tablet; Refill: 1  3. Allergic rhinitis, unspecified seasonality, unspecified trigger - will have her start fluticasone nasal spray, change daily antihistamine, follow up if any s/s infection - fluticasone (FLONASE) 50 MCG/ACT nasal spray; Place 2 sprays into both nostrils daily.  Dispense: 16 g; Refill: 6  4. Prediabetes - hemoglobin A1C; Future - CBC with Differential/Platelet; Future - Comprehensive metabolic panel; Future   Jamie Reamer, FNP-BC  [facility], [facility]  04/13/2020 5:46 AM   Follow Up Instructions:    I discussed the assessment and treatment plan with the patient. The patient was provided an opportunity to ask questions and all were answered. The patient agreed with the plan and demonstrated an understanding of the instructions.   The patient was advised to call  back or seek an in-person evaluation if the symptoms worsen or if the condition fails to improve as anticipated.    Elby Beck, FNP

## 2020-04-13 ENCOUNTER — Encounter: Payer: Self-pay | Admitting: Family Medicine

## 2020-04-13 MED ORDER — FUROSEMIDE 40 MG PO TABS: 40.0000 mg | ORAL_TABLET | Freq: Every day | ORAL | 0 refills | Status: DC

## 2020-04-13 MED ORDER — COLCHICINE 0.6 MG PO TABS: ORAL_TABLET | ORAL | 1 refills | Status: DC

## 2020-04-13 MED ORDER — FLUTICASONE PROPIONATE 50 MCG/ACT NA SUSP: 2.0000 | Freq: Every day | NASAL | 6 refills | Status: DC

## 2020-04-16 ENCOUNTER — Other Ambulatory Visit: Payer: Self-pay | Admitting: Family Medicine

## 2020-04-16 DIAGNOSIS — I10 Essential (primary) hypertension: Secondary | ICD-10-CM

## 2020-04-19 ENCOUNTER — Encounter: Payer: Medicare Other | Admitting: Family Medicine

## 2020-04-19 NOTE — Telephone Encounter (Signed)
Called and left voicemail for patient to return call to office regarding her prescription refill request.

## 2020-04-22 NOTE — Telephone Encounter (Signed)
Pharmacy requests refill on: Potassium Chloride 20 mEq/15 mL  LAST REFILL: 03/17/2020  LAST OV: 04/12/2020 NEXT OV: Not Scheduled  PHARMACY: [id number] [place], [place]

## 2020-06-02 ENCOUNTER — Other Ambulatory Visit: Payer: Self-pay | Admitting: Family Medicine

## 2020-06-02 DIAGNOSIS — I10 Essential (primary) hypertension: Secondary | ICD-10-CM

## 2020-06-04 NOTE — Telephone Encounter (Signed)
Last OV: 04/12/20 (virtual) Last refill: 04/22/20 475mL w/ 0 No future appts or TOC

## 2020-06-04 NOTE — Telephone Encounter (Signed)
Please have patient schedule acute visit for in-person evaluation and labs.   She is overdue.

## 2020-06-04 NOTE — Telephone Encounter (Signed)
Called patient to schedule OV and labs. Patient stated she will call back when ready.

## 2020-06-19 NOTE — Telephone Encounter (Signed)
This pt has labs that are overdue and will need an acute visit with Dr. Einar Pheasant for evaluation and labs. Labs have now popped up as overdue.  Please contact the pt again and advised pt needs to make apt with Dr.Cody and the importance of this apt. If any problems the pt call can be routed to a nurse to talk to pt. Once pt is scheduled please route note to lab to cancel existing orders in chart from [facility]. Dr. Einar Pheasant will enter new orders when pt is seen in clinic.

## 2020-06-21 NOTE — Telephone Encounter (Signed)
Called patient to schedule appointment. LVM to schedule.

## 2020-06-24 ENCOUNTER — Telehealth: Payer: Self-pay

## 2020-06-24 DIAGNOSIS — I10 Essential (primary) hypertension: Secondary | ICD-10-CM

## 2020-06-24 MED ORDER — LISINOPRIL 10 MG PO TABS
ORAL_TABLET | ORAL | 0 refills | Status: DC
Start: 2020-06-24 — End: 2020-09-23

## 2020-06-24 NOTE — Telephone Encounter (Signed)
Pharmacy requests refill on: Lisinopril 10 mg   LAST REFILL: 03/04/2020 (Q-90, R-0) LAST OV: 04/12/2020 NEXT OV: Not Scheduled  PHARMACY: [facility] [address] [place], [place]

## 2020-06-24 NOTE — Telephone Encounter (Signed)
Called patient to schedule follow up visit. Pt stated she is dealing with the active passing of her mother. Stated she has a lot on her plate. Plans on calling herself to schedule.

## 2020-06-30 ENCOUNTER — Other Ambulatory Visit: Payer: Self-pay

## 2020-06-30 DIAGNOSIS — I10 Essential (primary) hypertension: Secondary | ICD-10-CM

## 2020-06-30 MED ORDER — FUROSEMIDE 40 MG PO TABS: 40.0000 mg | ORAL_TABLET | Freq: Every day | ORAL | 0 refills | Status: DC

## 2020-06-30 NOTE — Telephone Encounter (Signed)
Pharmacy requests refill on: Furosemide 40 mg   LAST REFILL: 04/13/2020 (Q-90, R-0) LAST OV: 04/12/2020 NEXT OV: Not Scheduled  PHARMACY: [facility] [address] [place], [place]

## 2020-07-21 ENCOUNTER — Other Ambulatory Visit: Payer: Self-pay | Admitting: Family Medicine

## 2020-07-21 DIAGNOSIS — I10 Essential (primary) hypertension: Secondary | ICD-10-CM

## 2020-07-22 NOTE — Telephone Encounter (Signed)
Last office visit 04/12/2020 with Glenda Chroman for HTN/Allegic Rhinitis/ Prediabetes and Morbid Obesity.   Last refilled 04/22/2020 for 473 ml by Dr. Einar Pheasant.  No TOC appointments.

## 2020-07-23 ENCOUNTER — Other Ambulatory Visit: Payer: Self-pay | Admitting: Family Medicine

## 2020-07-23 DIAGNOSIS — I10 Essential (primary) hypertension: Secondary | ICD-10-CM

## 2020-07-23 NOTE — Telephone Encounter (Signed)
Patient requesting 90 day supply of potassium chloride 20 MEQ/15 ML soln. Okay to send?

## 2020-07-23 NOTE — Telephone Encounter (Signed)
Sent. Thanks.  Needs TOC appointment with labs with another provider.

## 2020-07-25 NOTE — Telephone Encounter (Signed)
Sent. Thanks.   

## 2020-09-22 ENCOUNTER — Other Ambulatory Visit: Payer: Self-pay | Admitting: Family Medicine

## 2020-09-22 DIAGNOSIS — I10 Essential (primary) hypertension: Secondary | ICD-10-CM

## 2020-09-24 NOTE — Telephone Encounter (Signed)
Furosemide Last rx:  06/30/20, #90/0 Last OV:  04/12/20, HTN f/u w/ Debbie Next OV:  none

## 2020-10-22 ENCOUNTER — Other Ambulatory Visit: Payer: Self-pay | Admitting: Family Medicine

## 2020-10-22 DIAGNOSIS — I10 Essential (primary) hypertension: Secondary | ICD-10-CM

## 2020-11-01 ENCOUNTER — Telehealth: Payer: Self-pay

## 2020-11-01 ENCOUNTER — Other Ambulatory Visit: Payer: Self-pay | Admitting: Family Medicine

## 2020-11-01 NOTE — Telephone Encounter (Signed)
I am closed to new patients.

## 2020-11-01 NOTE — Telephone Encounter (Signed)
Unable to see her

## 2020-11-01 NOTE — Telephone Encounter (Signed)
Patient use to see Tor Netters and she wanted to transfer care but wanted to see a MD not NP, and someone who has been practicing for a while also. I advised patient that I could ask but was not sure if they would be able to.  Please let patient know either way.  I am sending to several providers since I was not sure who would be ok with taking on the patient. Thank you!

## 2020-11-02 NOTE — Telephone Encounter (Signed)
Patient advised. Discussed availability with other offices. Patient lives in [place] and wanted to try office in [place]. Asked to have a list of providers and how long they practiced mailed to her home address, will work on this.

## 2020-11-11 ENCOUNTER — Telehealth: Payer: Self-pay

## 2020-12-21 ENCOUNTER — Other Ambulatory Visit: Payer: Self-pay | Admitting: Family

## 2020-12-21 DIAGNOSIS — I10 Essential (primary) hypertension: Secondary | ICD-10-CM

## 2020-12-30 NOTE — Telephone Encounter (Signed)
  Encourage patient to contact the pharmacy for refills or they can request refills through [place]:  Please schedule appointment if longer than 1 year  NEXT APPOINTMENT DATE: 02/17/21  MEDICATION:  lisinopril (ZESTRIL) 10 MG tablet  Is the patient out of medication? yes  [facility] [address]  Let patient know to contact pharmacy at the end of the day to make sure medication is ready.  Please notify patient to allow 48-72 hours to process  CLINICAL FILLS OUT ALL BELOW:   LAST REFILL:  QTY:  REFILL DATE:    OTHER COMMENTS:    Okay for refill?  Please advise

## 2021-01-01 ENCOUNTER — Other Ambulatory Visit: Payer: Self-pay | Admitting: Nurse Practitioner

## 2021-01-01 DIAGNOSIS — I10 Essential (primary) hypertension: Secondary | ICD-10-CM

## 2021-01-03 ENCOUNTER — Other Ambulatory Visit: Payer: Self-pay | Admitting: Family

## 2021-01-03 MED ORDER — FUROSEMIDE 40 MG PO TABS: ORAL_TABLET | ORAL | 0 refills | Status: DC

## 2021-01-03 MED ORDER — LISINOPRIL 10 MG PO TABS: ORAL_TABLET | ORAL | 0 refills | Status: DC

## 2021-01-03 NOTE — Telephone Encounter (Signed)
Pt called to check status on her lisinopril as she is out completely. Please advise.

## 2021-01-03 NOTE — Telephone Encounter (Signed)
Patient has TOC scheduled with Dr Ronnald Ramp on 02/17/2021 at another [facility] location.  Last refilled on 09/24/20 #90

## 2021-01-03 NOTE — Telephone Encounter (Signed)
Lisinopril last refill 09/23/20 #90 Last office visit 04/12/20 Upcoming appointment scheduled Dr. Ronnald Ramp 02/17/21 Lasix was refilled 01/03/21.

## 2021-01-03 NOTE — Telephone Encounter (Signed)
  Encourage patient to contact the pharmacy for refills or they can request refills through [place]:  Please schedule appointment if longer than 1 year  NEXT APPOINTMENT DATE:  MEDICATION:lisinopril (ZESTRIL) 10 MG tablet furosemide (LASIX) 40 MG tablet  Is the patient out of medication?   [facility]ACY:[id number] [place], [place] -   Let patient know to contact pharmacy at the end of the day to make sure medication is ready.  Please notify patient to allow 48-72 hours to process  CLINICAL FILLS OUT ALL BELOW:   LAST REFILL:  QTY:  REFILL DATE:    OTHER COMMENTS:    Okay for refill?  Please advise

## 2021-02-17 ENCOUNTER — Encounter: Payer: Self-pay | Admitting: Internal Medicine

## 2021-02-17 ENCOUNTER — Other Ambulatory Visit: Payer: Self-pay

## 2021-02-17 ENCOUNTER — Ambulatory Visit (INDEPENDENT_AMBULATORY_CARE_PROVIDER_SITE_OTHER): Payer: Medicare Other | Admitting: Internal Medicine

## 2021-02-17 VITALS — BP 154/92 | HR 95 | Temp 98.4°F | Resp 16 | Ht 62.0 in | Wt 353.0 lb

## 2021-02-17 DIAGNOSIS — J301 Allergic rhinitis due to pollen: Secondary | ICD-10-CM | POA: Diagnosis not present

## 2021-02-17 DIAGNOSIS — Z124 Encounter for screening for malignant neoplasm of cervix: Secondary | ICD-10-CM

## 2021-02-17 DIAGNOSIS — I10 Essential (primary) hypertension: Secondary | ICD-10-CM | POA: Diagnosis not present

## 2021-02-17 DIAGNOSIS — R9431 Abnormal electrocardiogram [ECG] [EKG]: Secondary | ICD-10-CM | POA: Diagnosis not present

## 2021-02-17 DIAGNOSIS — M1A09X0 Idiopathic chronic gout, multiple sites, without tophus (tophi): Secondary | ICD-10-CM | POA: Diagnosis not present

## 2021-02-17 DIAGNOSIS — J309 Allergic rhinitis, unspecified: Secondary | ICD-10-CM

## 2021-02-17 DIAGNOSIS — Z0001 Encounter for general adult medical examination with abnormal findings: Secondary | ICD-10-CM

## 2021-02-17 DIAGNOSIS — Z23 Encounter for immunization: Secondary | ICD-10-CM | POA: Diagnosis not present

## 2021-02-17 DIAGNOSIS — E785 Hyperlipidemia, unspecified: Secondary | ICD-10-CM | POA: Diagnosis not present

## 2021-02-17 DIAGNOSIS — E118 Type 2 diabetes mellitus with unspecified complications: Secondary | ICD-10-CM

## 2021-02-17 DIAGNOSIS — Z1231 Encounter for screening mammogram for malignant neoplasm of breast: Secondary | ICD-10-CM | POA: Insufficient documentation

## 2021-02-17 DIAGNOSIS — E213 Hyperparathyroidism, unspecified: Secondary | ICD-10-CM

## 2021-02-17 DIAGNOSIS — Z1211 Encounter for screening for malignant neoplasm of colon: Secondary | ICD-10-CM

## 2021-02-17 LAB — TROPONIN I (HIGH SENSITIVITY): High Sens Troponin I: 5 ng/L (ref 2–17)

## 2021-02-17 LAB — HEPATIC FUNCTION PANEL
ALT: 19 U/L (ref 0–35)
AST: 19 U/L (ref 0–37)
Albumin: 4.4 g/dL (ref 3.5–5.2)
Alkaline Phosphatase: 113 U/L (ref 39–117)
Bilirubin, Direct: 0.2 mg/dL (ref 0.0–0.3)
Total Bilirubin: 0.6 mg/dL (ref 0.2–1.2)
Total Protein: 9.2 g/dL — ABNORMAL HIGH (ref 6.0–8.3)

## 2021-02-17 LAB — BASIC METABOLIC PANEL
BUN: 21 mg/dL (ref 6–23)
CO2: 28 mEq/L (ref 19–32)
Calcium: 10.2 mg/dL (ref 8.4–10.5)
Chloride: 100 mEq/L (ref 96–112)
Creatinine, Ser: 1.14 mg/dL (ref 0.40–1.20)
GFR: 51.9 mL/min — ABNORMAL LOW (ref >60.00)
Glucose, Bld: 130 mg/dL — ABNORMAL HIGH (ref 70–99)
Potassium: 3.8 mEq/L (ref 3.5–5.1)
Sodium: 138 mEq/L (ref 135–145)

## 2021-02-17 LAB — URINALYSIS, ROUTINE W REFLEX MICROSCOPIC
Bilirubin Urine: NEGATIVE
Hgb urine dipstick: NEGATIVE
Ketones, ur: NEGATIVE
Leukocytes,Ua: NEGATIVE
Nitrite: POSITIVE — AB
RBC / HPF: NONE SEEN (ref 0–?)
Specific Gravity, Urine: >=1.03 — AB (ref 1.000–1.030)
Total Protein, Urine: NEGATIVE
Urine Glucose: NEGATIVE
Urobilinogen, UA: 0.2 (ref 0.0–1.0)
pH: 6 (ref 5.0–8.0)

## 2021-02-17 LAB — CBC WITH DIFFERENTIAL/PLATELET
Basophils Absolute: 0 10*3/uL (ref 0.0–0.1)
Basophils Relative: 0.3 % (ref 0.0–3.0)
Eosinophils Absolute: 0.4 10*3/uL (ref 0.0–0.7)
Eosinophils Relative: 3.5 % (ref 0.0–5.0)
HCT: 40.3 % (ref 36.0–46.0)
Hemoglobin: 13 g/dL (ref 12.0–15.0)
Lymphocytes Relative: 17.6 % (ref 12.0–46.0)
Lymphs Abs: 1.9 10*3/uL (ref 0.7–4.0)
MCHC: 32.1 g/dL (ref 30.0–36.0)
MCV: 76.8 fl — ABNORMAL LOW (ref 78.0–100.0)
Monocytes Absolute: 0.7 10*3/uL (ref 0.1–1.0)
Monocytes Relative: 6.4 % (ref 3.0–12.0)
Neutro Abs: 7.7 10*3/uL (ref 1.4–7.7)
Neutrophils Relative %: 72.2 % (ref 43.0–77.0)
Platelets: 245 10*3/uL (ref 150.0–400.0)
RBC: 5.25 Mil/uL — ABNORMAL HIGH (ref 3.87–5.11)
RDW: 16.1 % — ABNORMAL HIGH (ref 11.5–15.5)
WBC: 10.6 10*3/uL — ABNORMAL HIGH (ref 4.0–10.5)

## 2021-02-17 LAB — LIPID PANEL
Cholesterol: 180 mg/dL (ref 0–200)
HDL: 70.9 mg/dL
LDL Cholesterol: 91 mg/dL (ref 0–99)
NonHDL: 109.38
Total CHOL/HDL Ratio: 3
Triglycerides: 90 mg/dL (ref 0.0–149.0)
VLDL: 18 mg/dL (ref 0.0–40.0)

## 2021-02-17 LAB — URIC ACID: Uric Acid, Serum: 8.4 mg/dL — ABNORMAL HIGH (ref 2.4–7.0)

## 2021-02-17 LAB — BRAIN NATRIURETIC PEPTIDE: Pro B Natriuretic peptide (BNP): 18 pg/mL (ref 0.0–100.0)

## 2021-02-17 LAB — TSH: TSH: 2.25 u[IU]/mL (ref 0.35–5.50)

## 2021-02-17 LAB — VITAMIN D 25 HYDROXY (VIT D DEFICIENCY, FRACTURES): VITD: 52.53 ng/mL (ref 30.00–100.00)

## 2021-02-17 LAB — HEMOGLOBIN A1C: Hgb A1c MFr Bld: 7.1 % — ABNORMAL HIGH (ref 4.6–6.5)

## 2021-02-17 MED ORDER — ALLOPURINOL 100 MG PO TABS: 100.0000 mg | ORAL_TABLET | Freq: Every day | ORAL | 0 refills | Status: DC

## 2021-02-17 MED ORDER — TORSEMIDE 20 MG PO TABS: 20.0000 mg | ORAL_TABLET | Freq: Every day | ORAL | 0 refills | Status: DC

## 2021-02-17 MED ORDER — ROSUVASTATIN CALCIUM 5 MG PO TABS: 5.0000 mg | ORAL_TABLET | Freq: Every day | ORAL | 1 refills | Status: DC

## 2021-02-17 MED ORDER — FLUTICASONE PROPIONATE 50 MCG/ACT NA SUSP: 2.0000 | Freq: Every day | NASAL | 6 refills | Status: DC

## 2021-02-17 MED ORDER — POTASSIUM CHLORIDE 20 MEQ/15ML (10%) PO SOLN: 20.0000 meq | Freq: Two times a day (BID) | ORAL | 1 refills | Status: DC

## 2021-02-17 MED ORDER — TIRZEPATIDE 2.5 MG/0.5ML ~~LOC~~ SOAJ: 2.5000 mg | SUBCUTANEOUS | 0 refills | Status: DC

## 2021-02-17 NOTE — Patient Instructions (Signed)
Health Maintenance, Female Adopting a healthy lifestyle and getting preventive care are important in promoting health and wellness. Ask your health care provider about: The right schedule for you to have regular tests and exams. Things you can do on your own to prevent diseases and keep yourself healthy. What should I know about diet, weight, and exercise? Eat a healthy diet  Eat a diet that includes plenty of vegetables, fruits, low-fat dairy products, and lean protein. Do not eat a lot of foods that are high in solid fats, added sugars, or sodium. Maintain a healthy weight Body mass index (BMI) is used to identify weight problems. It estimates body fat based on height and weight. Your health care provider can help determine your BMI and help you achieve or maintain a healthy weight. Get regular exercise Get regular exercise. This is one of the most important things you can do for your health. Most adults should: Exercise for at least 150 minutes each week. The exercise should increase your heart rate and make you sweat (moderate-intensity exercise). Do strengthening exercises at least twice a week. This is in addition to the moderate-intensity exercise. Spend less time sitting. Even light physical activity can be beneficial. Watch cholesterol and blood lipids Have your blood tested for lipids and cholesterol at 61 years of age, then have this test every 5 years. Have your cholesterol levels checked more often if: Your lipid or cholesterol levels are high. You are older than 61 years of age. You are at high risk for heart disease. What should I know about cancer screening? Depending on your health history and family history, you may need to have cancer screening at various ages. This may include screening for: Breast cancer. Cervical cancer. Colorectal cancer. Skin cancer. Lung cancer. What should I know about heart disease, diabetes, and high blood pressure? Blood pressure and heart  disease High blood pressure causes heart disease and increases the risk of stroke. This is more likely to develop in people who have high blood pressure readings, are of African descent, or are overweight. Have your blood pressure checked: Every 3-5 years if you are 18-39 years of age. Every year if you are 40 years old or older. Diabetes Have regular diabetes screenings. This checks your fasting blood sugar level. Have the screening done: Once every three years after age 40 if you are at a normal weight and have a low risk for diabetes. More often and at a younger age if you are overweight or have a high risk for diabetes. What should I know about preventing infection? Hepatitis B If you have a higher risk for hepatitis B, you should be screened for this virus. Talk with your health care provider to find out if you are at risk for hepatitis B infection. Hepatitis C Testing is recommended for: Everyone born from 1945 through 1965. Anyone with known risk factors for hepatitis C. Sexually transmitted infections (STIs) Get screened for STIs, including gonorrhea and chlamydia, if: You are sexually active and are younger than 61 years of age. You are older than 61 years of age and your health care provider tells you that you are at risk for this type of infection. Your sexual activity has changed since you were last screened, and you are at increased risk for chlamydia or gonorrhea. Ask your health care provider if you are at risk. Ask your health care provider about whether you are at high risk for HIV. Your health care provider may recommend a prescription medicine   to help prevent HIV infection. If you choose to take medicine to prevent HIV, you should first get tested for HIV. You should then be tested every 3 months for as long as you are taking the medicine. Pregnancy If you are about to stop having your period (premenopausal) and you may become pregnant, seek counseling before you get  pregnant. Take 400 to 800 micrograms (mcg) of folic acid every day if you become pregnant. Ask for birth control (contraception) if you want to prevent pregnancy. Osteoporosis and menopause Osteoporosis is a disease in which the bones lose minerals and strength with aging. This can result in bone fractures. If you are 65 years old or older, or if you are at risk for osteoporosis and fractures, ask your health care provider if you should: Be screened for bone loss. Take a calcium or vitamin D supplement to lower your risk of fractures. Be given hormone replacement therapy (HRT) to treat symptoms of menopause. Follow these instructions at home: Lifestyle Do not use any products that contain nicotine or tobacco, such as cigarettes, e-cigarettes, and chewing tobacco. If you need help quitting, ask your health care provider. Do not use street drugs. Do not share needles. Ask your health care provider for help if you need support or information about quitting drugs. Alcohol use Do not drink alcohol if: Your health care provider tells you not to drink. You are pregnant, may be pregnant, or are planning to become pregnant. If you drink alcohol: Limit how much you use to 0-1 drink a day. Limit intake if you are breastfeeding. Be aware of how much alcohol is in your drink. In the [place] one drink equals one 12 oz bottle of beer (355 mL), one 5 oz glass of wine (148 mL), or one 1 oz glass of hard liquor (44 mL). General instructions Schedule regular health, dental, and eye exams. Stay current with your vaccines. Tell your health care provider if: You often feel depressed. You have ever been abused or do not feel safe at home. Summary Adopting a healthy lifestyle and getting preventive care are important in promoting health and wellness. Follow your health care provider's instructions about healthy diet, exercising, and getting tested or screened for diseases. Follow your health care provider's  instructions on monitoring your cholesterol and blood pressure. This information is not intended to replace advice given to you by your health care provider. Make sure you discuss any questions you have with your health care provider. Document Revised: 06/18/2020 Document Reviewed: 04/03/2018 Elsevier Patient Education  2022 [employer]  

## 2021-02-17 NOTE — Progress Notes (Signed)
Subjective:  Patient ID: Jamie Salazar, female    DOB: [birth year]  Age: 60 y.o. MRN: [id number]  CC: Annual Exam and Hypertension  This visit occurred during the SARS-CoV-2 public health emergency.  Safety protocols were in place, including screening questions prior to the visit, additional usage of staff PPE, and extensive cleaning of exam room while observing appropriate contact time as indicated for disinfecting solutions.    HPI Jamie Salazar presents for a CPX and to establish.  She has had seasonal allergies for many years and complains of a chronic cough.  Her allergies have been more severe recently.  Her cough is productive of clear phlegm.  She complains of a scratchy sensation in her throat with postnasal drip and nasal congestion.  She is not getting much symptom relief with Zyrtec.  She would like to be tested for allergies.  She has a history of hypertension and is taking an ACE inhibitor and a loop diuretic.  She complains of weight gain but denies chest pain, diaphoresis, dizziness, or lightheadedness.  Outpatient Medications Prior to Visit  Medication Sig Dispense Refill   cetirizine (ZYRTEC) 10 MG tablet       colchicine 0.6 MG tablet At gout onset, take two pills. May take one more in 1 hour. No more than 3 pills in 72 hours (3 days). 6 tablet 1   fluticasone (FLONASE) 50 MCG/ACT nasal spray Place 2 sprays into both nostrils daily. 16 g 6   furosemide (LASIX) 40 MG tablet TAKE 1 TABLET(40 MG) BY MOUTH DAILY 90 tablet 0   lisinopril (ZESTRIL) 10 MG tablet TAKE 1 TABLET(10 MG) BY MOUTH DAILY 90 tablet 0   Misc Natural Products (TART CHERRY ADVANCED) CAPS Take 2 capsules by mouth daily.     OVER THE COUNTER MEDICATION      potassium chloride 20 MEQ/15ML (10%) SOLN TAKE 15 ML BY MOUTH EVERY DAY 1350 mL 12   No facility-administered medications prior to visit.    ROS Review of Systems  Constitutional:  Positive for unexpected weight change (wt gain).  Negative for appetite change, chills, diaphoresis and fatigue.  HENT:  Positive for postnasal drip and rhinorrhea. Negative for ear pain, facial swelling, nosebleeds, sinus pressure, sinus pain, sneezing, sore throat, trouble swallowing and voice change.   Respiratory:  Positive for cough. Negative for chest tightness, shortness of breath and wheezing.   Cardiovascular:  Negative for chest pain, palpitations and leg swelling.  Gastrointestinal:  Negative for abdominal pain, constipation, diarrhea, nausea and vomiting.  Endocrine: Negative for polydipsia, polyphagia and polyuria.  Genitourinary: Negative.  Negative for difficulty urinating, frequency, hematuria and urgency.  Musculoskeletal: Negative.  Negative for arthralgias and myalgias.  Skin: Negative.  Negative for color change and rash.  Neurological: Negative.  Negative for dizziness, weakness, light-headedness and numbness.  Hematological:  Negative for adenopathy. Does not bruise/bleed easily.  Psychiatric/Behavioral: Negative.     Objective:  BP (!) 154/92 (BP Location: Left Arm, Patient Position: Sitting, Cuff Size: Large)   Pulse 95   Temp 98.4 F (36.9 C) (Oral)   Resp 16   Ht 5\' 2"  (1.575 m)   Wt (!) 353 lb (160.1 kg)   SpO2 99%   BMI 64.56 kg/m   BP Readings from Last 3 Encounters:  02/17/21 (!) 154/92  02/11/18 132/88  02/02/17 126/70    Wt Readings from Last 3 Encounters:  02/17/21 (!) 353 lb (160.1 kg)  04/12/20 (!) 320 lb (145.2 kg)  02/11/18 Marland Kitchen)  328 lb 12.8 oz (149.1 kg)    Physical Exam Vitals reviewed.  Constitutional:      General: She is not in acute distress.    Appearance: She is obese. She is not ill-appearing, toxic-appearing or diaphoretic.  HENT:     Nose: Nose normal.     Mouth/Throat:     Mouth: Mucous membranes are moist.  Eyes:     General: No scleral icterus.    Conjunctiva/sclera: Conjunctivae normal.  Cardiovascular:     Rate and Rhythm: Normal rate and regular rhythm.     Heart  sounds: Normal heart sounds, S1 normal and S2 normal.    No friction rub. No gallop.     Comments: EKG- NSR, 91 bpm ? Anterior infarct Low voltage c/w habitus No LVH or Q waves No old for comparison Pulmonary:     Effort: Pulmonary effort is normal.     Breath sounds: No stridor. No wheezing, rhonchi or rales.  Abdominal:     General: Abdomen is protuberant. Bowel sounds are normal. There is no distension.     Palpations: Abdomen is soft. There is no hepatomegaly, splenomegaly or mass.     Tenderness: There is no abdominal tenderness.  Musculoskeletal:        General: No swelling. Normal range of motion.     Cervical back: Neck supple.     Right lower leg: 1+ Pitting Edema present.     Left lower leg: 1+ Pitting Edema present.  Lymphadenopathy:     Cervical: No cervical adenopathy.  Skin:    General: Skin is warm and dry.     Coloration: Skin is not jaundiced or pale.     Findings: No rash.  Neurological:     General: No focal deficit present.     Mental Status: She is alert and oriented to person, place, and time. Mental status is at baseline.  Psychiatric:        Mood and Affect: Mood normal.        Behavior: Behavior normal.    Lab Results  Component Value Date   WBC 10.6 (H) 02/17/2021   HGB 13.0 02/17/2021   HCT 40.3 02/17/2021   PLT 245.0 02/17/2021   GLUCOSE 130 (H) 02/17/2021   CHOL 180 02/17/2021   TRIG 90.0 02/17/2021   HDL 70.90 02/17/2021   LDLCALC 91 02/17/2021   ALT 19 02/17/2021   AST 19 02/17/2021   NA 138 02/17/2021   K 3.8 02/17/2021   CL 100 02/17/2021   CREATININE 1.14 02/17/2021   BUN 21 02/17/2021   CO2 28 02/17/2021   TSH 2.25 02/17/2021   HGBA1C 7.1 (H) 02/17/2021    MM 3D SCREEN BREAST BILATERAL  Result Date: 02/15/2018 CLINICAL DATA:  Screening. EXAM: DIGITAL SCREENING BILATERAL MAMMOGRAM WITH TOMO AND CAD COMPARISON:  Previous exam(s). ACR Breast Density Category a: The breast tissue is almost entirely fatty. FINDINGS: There are  no findings suspicious for malignancy. Images were processed with CAD. IMPRESSION: No mammographic evidence of malignancy. A result letter of this screening mammogram will be mailed directly to the patient. RECOMMENDATION: Screening mammogram in one year. (Code:[id number]-B-01Y) BI-RADS CATEGORY  1: Negative. Electronically Signed   By: Margarette Canada M.D.   On: 02/15/2018 16:47    Assessment & Plan:   Jermani was seen today for annual exam and hypertension.  Diagnoses and all orders for this visit:  Encounter for general adult medical examination with abnormal findings- Exam completed, labs reviewed, vaccines reviewed and updated,  cancer screenings addressed, patient education material was given.  Essential hypertension- Her blood pressure is not adequately well controlled but I am concerned the ACE inhibitor is contributing to the cough.  She also has a history of gout so will avoid thiazide diuretics.  I recommended that she upgrade to a loop diuretic with a longer half-life. -     EKG 12-Lead -     CBC with Differential/Platelet; Future -     Basic metabolic panel; Future -     TSH; Future -     Urinalysis, Routine w reflex microscopic; Future -     VITAMIN D 25 Hydroxy (Vit-D Deficiency, Fractures); Future -     VITAMIN D 25 Hydroxy (Vit-D Deficiency, Fractures) -     Urinalysis, Routine w reflex microscopic -     TSH -     Basic metabolic panel -     CBC with Differential/Platelet -     torsemide (DEMADEX) 20 MG tablet; Take 1 tablet (20 mg total) by mouth daily. -     potassium chloride 20 MEQ/15ML (10%) SOLN; Take 15 mLs (20 mEq total) by mouth 2 (two) times daily.  Hyperlipidemia with target LDL less than 130- I have asked her to take a statin for cardiovascular risk reduction. -     TSH; Future -     Lipid panel; Future -     Hepatic function panel; Future -     Hepatic function panel -     Lipid panel -     TSH -     rosuvastatin (CRESTOR) 5 MG tablet; Take 1 tablet (5 mg total)  by mouth daily.  Seasonal allergic rhinitis due to pollen -     fluticasone (FLONASE) 50 MCG/ACT nasal spray; Place 2 sprays into both nostrils daily. -     Ambulatory referral to Allergy  MORBID OBESITY -     Hemoglobin A1c; Future -     VITAMIN D 25 Hydroxy (Vit-D Deficiency, Fractures); Future -     VITAMIN D 25 Hydroxy (Vit-D Deficiency, Fractures) -     Hemoglobin A1c  Mild hyperparathyroidism ([place])- Her calcium level is normal now.  Idiopathic chronic gout of multiple sites without tophus- Her uric acid level is elevated.  I recommended that she start taking a xanthine oxidase inhibitor. -     Uric acid; Future -     Uric acid -     allopurinol (ZYLOPRIM) 100 MG tablet; Take 1 tablet (100 mg total) by mouth daily.  Allergic rhinitis, unspecified seasonality, unspecified trigger -     fluticasone (FLONASE) 50 MCG/ACT nasal spray; Place 2 sprays into both nostrils daily. -     Ambulatory referral to Allergy  Abnormal EKG- Her enzymes are all normal.  I have asked her to see cardiology. -     Troponin I (High Sensitivity); Future -     Brain natriuretic peptide; Future -     Ambulatory referral to Cardiology -     Brain natriuretic peptide -     Troponin I (High Sensitivity)  Screen for colon cancer -     MM DIGITAL SCREENING BILATERAL; Future  Visit for screening mammogram -     Cologuard  Type II diabetes mellitus with manifestations ([place])- Will treat with a GLP/GIP agonist. -     tirzepatide (MOUNJARO) 2.5 MG/0.5ML Pen; Inject 2.5 mg into the skin once a week.  Screening for cervical cancer -     Ambulatory referral to Gynecology  Other orders -     Flu Vaccine QUAD 6+ mos PF IM (Fluarix Quad PF) -     Tdap vaccine greater than or equal to 7yo IM  I have discontinued [name] "Toni"'s Tart Cherry Advanced, OVER THE COUNTER MEDICATION, colchicine, furosemide, and lisinopril. I have also changed her potassium chloride. Additionally, I am having her start on  allopurinol, tirzepatide, torsemide, and rosuvastatin. Lastly, I am having her maintain her cetirizine and fluticasone.  Meds ordered this encounter  Medications   fluticasone (FLONASE) 50 MCG/ACT nasal spray    Sig: Place 2 sprays into both nostrils daily.    Dispense:  16 g    Refill:  6   allopurinol (ZYLOPRIM) 100 MG tablet    Sig: Take 1 tablet (100 mg total) by mouth daily.    Dispense:  90 tablet    Refill:  0   tirzepatide (MOUNJARO) 2.5 MG/0.5ML Pen    Sig: Inject 2.5 mg into the skin once a week.    Dispense:  2 mL    Refill:  0   torsemide (DEMADEX) 20 MG tablet    Sig: Take 1 tablet (20 mg total) by mouth daily.    Dispense:  90 tablet    Refill:  0   rosuvastatin (CRESTOR) 5 MG tablet    Sig: Take 1 tablet (5 mg total) by mouth daily.    Dispense:  90 tablet    Refill:  1   potassium chloride 20 MEQ/15ML (10%) SOLN    Sig: Take 15 mLs (20 mEq total) by mouth 2 (two) times daily.    Dispense:  3800 mL    Refill:  1      Follow-up: Return in about 6 weeks (around 03/31/2021).  Scarlette Calico, MD

## 2021-02-18 DIAGNOSIS — E118 Type 2 diabetes mellitus with unspecified complications: Secondary | ICD-10-CM | POA: Insufficient documentation

## 2021-02-18 DIAGNOSIS — Z124 Encounter for screening for malignant neoplasm of cervix: Secondary | ICD-10-CM | POA: Insufficient documentation

## 2021-02-21 ENCOUNTER — Telehealth: Payer: Self-pay | Admitting: Internal Medicine

## 2021-02-21 NOTE — Telephone Encounter (Signed)
Patient requesting call back to discuss medication prescribed at last visit 02-17-2021

## 2021-02-21 NOTE — Telephone Encounter (Signed)
Called pt, LVM.   

## 2021-02-23 ENCOUNTER — Encounter: Payer: Self-pay | Admitting: Internal Medicine

## 2021-03-07 ENCOUNTER — Other Ambulatory Visit: Payer: Self-pay | Admitting: Internal Medicine

## 2021-03-07 DIAGNOSIS — Z1231 Encounter for screening mammogram for malignant neoplasm of breast: Secondary | ICD-10-CM

## 2021-03-08 ENCOUNTER — Other Ambulatory Visit: Payer: Self-pay

## 2021-03-08 ENCOUNTER — Ambulatory Visit: Payer: Medicare Other | Admitting: Cardiology

## 2021-03-08 ENCOUNTER — Encounter: Payer: Self-pay | Admitting: Cardiology

## 2021-03-08 VITALS — BP 127/86 | HR 119 | Resp 16 | Ht 62.0 in | Wt 343.8 lb

## 2021-03-08 DIAGNOSIS — E1169 Type 2 diabetes mellitus with other specified complication: Secondary | ICD-10-CM

## 2021-03-08 DIAGNOSIS — R000 Tachycardia, unspecified: Secondary | ICD-10-CM

## 2021-03-08 DIAGNOSIS — R9431 Abnormal electrocardiogram [ECG] [EKG]: Secondary | ICD-10-CM

## 2021-03-08 DIAGNOSIS — R0602 Shortness of breath: Secondary | ICD-10-CM

## 2021-03-08 DIAGNOSIS — E119 Type 2 diabetes mellitus without complications: Secondary | ICD-10-CM

## 2021-03-08 MED ORDER — METOPROLOL SUCCINATE ER 25 MG PO TB24: 25.0000 mg | ORAL_TABLET | ORAL | 0 refills | Status: DC

## 2021-03-08 NOTE — Progress Notes (Signed)
Date:  03/08/2021   ID:  [name], DOB March 17, 1961, MRN [id number]  PCP:  Janith Lima, MD  Cardiologist:  Rex Kras, DO, [facility] (established care 03/08/21)  REASON FOR CONSULT: Abnormal EKG  REQUESTING PHYSICIAN:  Janith Lima, MD [id number] [place],  [place] [address]  Chief Complaint  Patient presents with   Abnormal ECG   New Patient (Initial Visit)   Shortness of Breath    HPI  Jamie Salazar is a 60 y.o. African-American female who presents to the office with a chief complaint of " shortness of breath and abnormal EKG." Patient's past medical history and cardiovascular risk factors include: NIDDM, HTN, Hyperlipidemia, obesity due to excess calories.   She is referred to the office at the request of Janith Lima, MD for evaluation of Abnormal EKG.  Patient presents to the office accompanied by her sister Jamie Salazar and provides verbal consent with regards to discussing her medical information in her presence.  She recently establish care with Dr. Ronnald Ramp and was having symptoms of URI most likely secondary to allergies but associated with that she was having shortness of breath and lower extremity swelling.  She was started on torsemide and since then has lost approximately 10 pounds.  She is also referred to cardiology for abnormal EKG.  Labs from 02/17/2021 independently reviewed.  BNP 18, high sensitive troponin negative x1, and TSH within normal limits.  Patient denies any active chest pain at rest or with effort related activities.  However her functional capacity is overall not optimized but is working on getting physically active.  No prior cardiovascular testing available for review.  EKG notes sinus tachycardia.  Patient denies any prolonged periods of immobilization.  No prior history of DVT or PE.  FUNCTIONAL STATUS: No structured exercise program or daily routine.   ALLERGIES: Allergies  Allergen Reactions   Lisinopril  Cough   Ciprofloxacin Hcl     Hallucination     MEDICATION LIST PRIOR TO VISIT: Current Meds  Medication Sig   allopurinol (ZYLOPRIM) 100 MG tablet Take 1 tablet (100 mg total) by mouth daily.   cetirizine (ZYRTEC) 10 MG tablet     metoprolol succinate (TOPROL XL) 25 MG 24 hr tablet Take 1 tablet (25 mg total) by mouth every morning. Hold if systolic blood pressure (top number) less than 100 mmHg or pulse less than 60 bpm.   potassium chloride 20 MEQ/15ML (10%) SOLN Take 15 mLs (20 mEq total) by mouth 2 (two) times daily.   rosuvastatin (CRESTOR) 5 MG tablet Take 1 tablet (5 mg total) by mouth daily.   tirzepatide [facility]) 2.5 MG/0.5ML Pen Inject 2.5 mg into the skin once a week.   torsemide (DEMADEX) 20 MG tablet Take 1 tablet (20 mg total) by mouth daily.     PAST MEDICAL HISTORY: Past Medical History:  Diagnosis Date   Allergy    Zyrtec daily.   Anxiety    Arthritis    disability   Depression    Diabetes mellitus without complication ([place])    Hyperlipidemia    Hypertension    Obesity, morbid ([place])     PAST SURGICAL HISTORY: Past Surgical History:  Procedure Laterality Date   CHOLECYSTECTOMY     gallbladder      FAMILY HISTORY: The patient family history includes Diabetes in her mother; Heart disease in her mother; Hypertension in her mother; Lupus in her mother.  SOCIAL HISTORY:  The patient  reports that she  has never smoked. Her smokeless tobacco use includes snuff. She reports that she does not drink alcohol and does not use drugs.  REVIEW OF SYSTEMS: Review of Systems  Constitutional: Negative for chills and fever.  HENT:  Negative for hoarse voice and nosebleeds.   Eyes:  Negative for discharge, double vision and pain.  Cardiovascular:  Positive for dyspnea on exertion. Negative for chest pain, claudication, leg swelling, near-syncope, orthopnea, palpitations, paroxysmal nocturnal dyspnea and syncope.  Respiratory:  Negative for hemoptysis and shortness  of breath.   Musculoskeletal:  Negative for muscle cramps and myalgias.  Gastrointestinal:  Negative for abdominal pain, constipation, diarrhea, hematemesis, hematochezia, melena, nausea and vomiting.  Neurological:  Negative for dizziness and light-headedness.   PHYSICAL EXAM: Vitals with BMI 03/08/2021 02/17/2021 04/12/2020  Height 5\' 2"  5\' 2"  -  Weight 343 lbs 13 oz 353 lbs 320 lbs  BMI 83.41 96.22 -  Systolic 297 989 -  Diastolic 86 92 -  Pulse 211 95 -    CONSTITUTIONAL: Well-developed and well-nourished. No acute distress.  SKIN: Skin is warm and dry. No rash noted. No cyanosis. No pallor. No jaundice HEAD: Normocephalic and atraumatic.  EYES: No scleral icterus MOUTH/THROAT: Moist oral membranes.  NECK: No JVD present. No thyromegaly noted. No carotid bruits  LYMPHATIC: No visible cervical adenopathy.  CHEST Normal respiratory effort. No intercostal retractions  LUNGS: Clear to auscultation bilaterally.  No stridor. No wheezes. No rales.  CARDIOVASCULAR: Tachycardia, regular, positive S1-S2, no murmurs rubs or gallops appreciated. ABDOMINAL: Obese, soft, nontender, nondistended, positive bowel sounds in all 4 quadrants, no apparent ascites.  EXTREMITIES: No peripheral edema, warm to touch,  HEMATOLOGIC: No significant bruising NEUROLOGIC: Oriented to person, place, and time. Nonfocal. Normal muscle tone.  PSYCHIATRIC: Normal mood and affect. Normal behavior. Cooperative  CARDIAC DATABASE: EKG: 03/08/2021: Sinus tachycardia, 115 bpm, poor R wave progression, without underlying injury pattern.   Echocardiogram: No results found for this or any previous visit from the past 1095 days.   Stress Testing: No results found for this or any previous visit from the past 1095 days.  Heart Catheterization: None  LABORATORY DATA: CBC Latest Ref Rng & Units 02/17/2021 02/02/2017 07/05/2016  WBC 4.0 - 10.5 K/uL 10.6(H) 11.6(H) 10.1  Hemoglobin 12.0 - 15.0 g/dL 13.0 13.2 13.4   Hematocrit 36.0 - 46.0 % 40.3 40.6 41.2  Platelets 150.0 - 400.0 K/uL 245.0 321 258.0    CMP Latest Ref Rng & Units 02/17/2021 02/11/2018 02/02/2017  Glucose 70 - 99 mg/dL 130(H) 113(H) 140(H)  BUN 6 - 23 mg/dL 21 15 14   Creatinine 0.40 - 1.20 mg/dL 1.14 1.20 1.05  Sodium 135 - 145 mEq/L 138 139 141  Potassium 3.5 - 5.1 mEq/L 3.8 4.0 4.5  Chloride 96 - 112 mEq/L 100 100 101  CO2 19 - 32 mEq/L 28 29 30   Calcium 8.4 - 10.5 mg/dL 10.2 11.1(H) 9.9  Total Protein 6.0 - 8.3 g/dL 9.2(H) - 8.3(H)  Total Bilirubin 0.2 - 1.2 mg/dL 0.6 - 0.6  Alkaline Phos 39 - 117 U/L 113 - -  AST 0 - 37 U/L 19 - 18  ALT 0 - 35 U/L 19 - 16    Lipid Panel     Component Value Date/Time   CHOL 180 02/17/2021 1414   TRIG 90.0 02/17/2021 1414   HDL 70.90 02/17/2021 1414   CHOLHDL 3 02/17/2021 1414   VLDL 18.0 02/17/2021 1414   LDLCALC 91 02/17/2021 1414    No components found for: NTPROBNP  Recent Labs    02/17/21 1414  PROBNP 18.0   Recent Labs    02/17/21 1414  TSH 2.25    BMP Recent Labs    02/17/21 1414  NA 138  K 3.8  CL 100  CO2 28  GLUCOSE 130*  BUN 21  CREATININE 1.14  CALCIUM 10.2    HEMOGLOBIN A1C Lab Results  Component Value Date   HGBA1C 7.1 (H) 02/17/2021   MPG 148 02/02/2017    IMPRESSION:    ICD-10-CM   1. Nonspecific abnormal electrocardiogram (ECG) (EKG)  R94.31 EKG 12-Lead    PCV ECHOCARDIOGRAM COMPLETE    2. Tachycardia  R00.0 metoprolol succinate (TOPROL XL) 25 MG 24 hr tablet    LONG TERM MONITOR (3-14 DAYS)    Basic metabolic panel    3. Shortness of breath  R06.02     4. Non-insulin dependent type 2 diabetes mellitus (HCC)  E11.9 CT CARDIAC SCORING ([facility] LOCATIONS ONLY)    5. Type 2 diabetes mellitus with hyperlipidemia (HCC)  E11.69 CT CARDIAC SCORING ([facility] LOCATIONS ONLY)   E78.5        RECOMMENDATIONS: Rosaly Labarbera is a 60 y.o. African-American female whose past medical history and cardiac risk factors include: NIDDM, HTN,  Hyperlipidemia, obesity due to excess calories.   Patient is referred to the office for evaluation of an abnormal EKG.  EKG at today's office visit notes sinus tachycardia without underlying injury pattern.  However, in the setting of tachycardia and shortness of breath we discussed if she has had any periods of prolonged immobilization or any prior history of DVT/PE, which she denies.  Still recommend checking a D-dimer and if elevated she would benefit from a CT PE protocol to rule out pulmonary embolism.  Patient's primary care team has taken the liberty to check troponins, TSH, and BNP.  TSH within normal limits.  And BMP is also within normal limits.  Clinically she is euvolemic and not in congestive heart failure.  However, patient has recently been started on torsemide with potassium supplement and has lost approximately 10 pounds since her office visit with Dr. Ronnald Ramp on 02/17/2021.  Recommend echocardiogram to evaluate for structural heart disease and LVEF.  Given her underlying tachycardia and recent episode of expressive aphasia and vision changes recommend extended Holter monitor to evaluate for atrial fibrillation.  14-day extended Holter monitor to evaluate for dysrhythmias.  Patient will be given a prescription of metoprolol to start after her monitor.  Given her new diagnosis of diabetes mellitus type 2 patient has been started on statin therapy.  We will proceed with a calcium score for further risk stratification.  We will determine ischemic work-up based on her CAC.  I have asked her to discuss possible initiation of either ACE inhibitor/ARB with her PCP at the next office visit with regards to renal protection in setting of diabetes.  Patient is educated on the importance of increasing physical activity as tolerated with a goal of 30 minutes a day 5 days a week of moderate intensity exercise.  She is educated on the importance of a heart healthy diet and looking into [employer] as  well.  I will see her back in the office once the completion of the diagnostic testing and further recommendations to follow.  Thank you for allowing [place] to participate in the care of [place] please reach out if any questions or concerns arise.  FINAL MEDICATION LIST END OF ENCOUNTER: Meds ordered this encounter  Medications  metoprolol succinate (TOPROL XL) 25 MG 24 hr tablet    Sig: Take 1 tablet (25 mg total) by mouth every morning. Hold if systolic blood pressure (top number) less than 100 mmHg or pulse less than 60 bpm.    Dispense:  90 tablet    Refill:  0     Medications Discontinued During This Encounter  Medication Reason   fluticasone (FLONASE) 50 MCG/ACT nasal spray Error     Current Outpatient Medications:    allopurinol (ZYLOPRIM) 100 MG tablet, Take 1 tablet (100 mg total) by mouth daily., Disp: 90 tablet, Rfl: 0   cetirizine (ZYRTEC) 10 MG tablet,  , Disp: , Rfl:    metoprolol succinate (TOPROL XL) 25 MG 24 hr tablet, Take 1 tablet (25 mg total) by mouth every morning. Hold if systolic blood pressure (top number) less than 100 mmHg or pulse less than 60 bpm., Disp: 90 tablet, Rfl: 0   potassium chloride 20 MEQ/15ML (10%) SOLN, Take 15 mLs (20 mEq total) by mouth 2 (two) times daily., Disp: 3800 mL, Rfl: 1   rosuvastatin (CRESTOR) 5 MG tablet, Take 1 tablet (5 mg total) by mouth daily., Disp: 90 tablet, Rfl: 1   tirzepatide (MOUNJARO) 2.5 MG/0.5ML Pen, Inject 2.5 mg into the skin once a week., Disp: 2 mL, Rfl: 0   torsemide (DEMADEX) 20 MG tablet, Take 1 tablet (20 mg total) by mouth daily., Disp: 90 tablet, Rfl: 0  Orders Placed This Encounter  Procedures   CT CARDIAC SCORING ([facility] LOCATIONS ONLY)   Basic metabolic panel   LONG TERM MONITOR (3-14 DAYS)   EKG 12-Lead   PCV ECHOCARDIOGRAM COMPLETE    There are no Patient Instructions on file for this visit.   --Continue cardiac medications as reconciled in final medication list. --Return in about 4  weeks (around 04/05/2021) for Follow up, Dyspnea, Review test results. Or sooner if needed. --Continue follow-up with your primary care physician regarding the management of your other chronic comorbid conditions.  Patient's questions and concerns were addressed to her satisfaction. She voices understanding of the instructions provided during this encounter.   This note was created using a voice recognition software as a result there may be grammatical errors inadvertently enclosed that do not reflect the nature of this encounter. Every attempt is made to correct such errors.  Rex Kras, [place], [facility]  Pager: [phone] Office: [phone]

## 2021-03-09 LAB — BASIC METABOLIC PANEL
BUN/Creatinine Ratio: 16 (ref 12–28)
BUN: 25 mg/dL (ref 8–27)
CO2: 23 mmol/L (ref 20–29)
Calcium: 10 mg/dL (ref 8.7–10.3)
Chloride: 98 mmol/L (ref 96–106)
Creatinine, Ser: 1.56 mg/dL — ABNORMAL HIGH (ref 0.57–1.00)
Glucose: 122 mg/dL — ABNORMAL HIGH (ref 70–99)
Potassium: 4.8 mmol/L (ref 3.5–5.2)
Sodium: 140 mmol/L (ref 134–144)
eGFR: 38 mL/min/{1.73_m2} — ABNORMAL LOW (ref >59)

## 2021-03-10 ENCOUNTER — Other Ambulatory Visit: Payer: Self-pay

## 2021-03-10 DIAGNOSIS — R0602 Shortness of breath: Secondary | ICD-10-CM

## 2021-03-10 DIAGNOSIS — R9431 Abnormal electrocardiogram [ECG] [EKG]: Secondary | ICD-10-CM

## 2021-03-10 DIAGNOSIS — R000 Tachycardia, unspecified: Secondary | ICD-10-CM

## 2021-03-10 NOTE — Progress Notes (Signed)
I spoke to patient she voiced understanding MAR has been updated  Called [facility] no order was place for D-Dimer and it is too late to add an order pt would have to go back to [facility]

## 2021-03-11 ENCOUNTER — Other Ambulatory Visit: Payer: Self-pay | Admitting: Cardiology

## 2021-03-11 DIAGNOSIS — N179 Acute kidney failure, unspecified: Secondary | ICD-10-CM

## 2021-03-11 LAB — D-DIMER, QUANTITATIVE: D-DIMER: 0.37 mg/L FEU (ref 0.00–0.49)

## 2021-03-15 ENCOUNTER — Inpatient Hospital Stay: Payer: Medicare Other

## 2021-03-15 ENCOUNTER — Other Ambulatory Visit: Payer: Self-pay | Admitting: Internal Medicine

## 2021-03-15 ENCOUNTER — Ambulatory Visit: Payer: Medicare Other

## 2021-03-15 ENCOUNTER — Other Ambulatory Visit: Payer: Self-pay

## 2021-03-15 DIAGNOSIS — R9431 Abnormal electrocardiogram [ECG] [EKG]: Secondary | ICD-10-CM

## 2021-03-15 DIAGNOSIS — E118 Type 2 diabetes mellitus with unspecified complications: Secondary | ICD-10-CM

## 2021-03-15 DIAGNOSIS — R000 Tachycardia, unspecified: Secondary | ICD-10-CM

## 2021-03-16 ENCOUNTER — Other Ambulatory Visit: Payer: Self-pay | Admitting: Internal Medicine

## 2021-03-16 DIAGNOSIS — E118 Type 2 diabetes mellitus with unspecified complications: Secondary | ICD-10-CM

## 2021-03-16 MED ORDER — TIRZEPATIDE 5 MG/0.5ML ~~LOC~~ SOAJ: 5.0000 mg | SUBCUTANEOUS | 0 refills | Status: DC

## 2021-03-22 LAB — BASIC METABOLIC PANEL
BUN/Creatinine Ratio: 10 — ABNORMAL LOW (ref 12–28)
BUN: 13 mg/dL (ref 8–27)
CO2: 21 mmol/L (ref 20–29)
Calcium: 9.8 mg/dL (ref 8.7–10.3)
Chloride: 101 mmol/L (ref 96–106)
Creatinine, Ser: 1.25 mg/dL — ABNORMAL HIGH (ref 0.57–1.00)
Glucose: 122 mg/dL — ABNORMAL HIGH (ref 70–99)
Potassium: 4.2 mmol/L (ref 3.5–5.2)
Sodium: 141 mmol/L (ref 134–144)
eGFR: 49 mL/min/{1.73_m2} — ABNORMAL LOW (ref >59)

## 2021-03-22 LAB — MAGNESIUM: Magnesium: 2 mg/dL (ref 1.6–2.3)

## 2021-03-24 ENCOUNTER — Other Ambulatory Visit: Payer: Self-pay

## 2021-03-24 DIAGNOSIS — N179 Acute kidney failure, unspecified: Secondary | ICD-10-CM

## 2021-03-24 NOTE — Progress Notes (Signed)
Spoke to patient she voiced understanding MAR has been updated

## 2021-03-30 ENCOUNTER — Other Ambulatory Visit: Payer: Self-pay | Admitting: Family

## 2021-03-30 DIAGNOSIS — I10 Essential (primary) hypertension: Secondary | ICD-10-CM

## 2021-03-31 ENCOUNTER — Other Ambulatory Visit: Payer: Self-pay

## 2021-03-31 ENCOUNTER — Encounter: Payer: Self-pay | Admitting: Internal Medicine

## 2021-03-31 ENCOUNTER — Ambulatory Visit (INDEPENDENT_AMBULATORY_CARE_PROVIDER_SITE_OTHER): Payer: Medicare Other | Admitting: Internal Medicine

## 2021-03-31 ENCOUNTER — Ambulatory Visit
Admission: RE | Admit: 2021-03-31 | Discharge: 2021-03-31 | Disposition: A | Discharge status: Home / Self Care | Payer: Medicare Other | Source: Ambulatory Visit | Attending: Cardiology | Admitting: Cardiology

## 2021-03-31 VITALS — BP 126/84 | HR 98 | Temp 97.9°F | Resp 16 | Ht 62.0 in | Wt 336.0 lb

## 2021-03-31 DIAGNOSIS — R202 Paresthesia of skin: Secondary | ICD-10-CM

## 2021-03-31 DIAGNOSIS — R200 Anesthesia of skin: Secondary | ICD-10-CM | POA: Diagnosis not present

## 2021-03-31 DIAGNOSIS — I10 Essential (primary) hypertension: Secondary | ICD-10-CM | POA: Diagnosis not present

## 2021-03-31 DIAGNOSIS — E118 Type 2 diabetes mellitus with unspecified complications: Secondary | ICD-10-CM | POA: Diagnosis not present

## 2021-03-31 DIAGNOSIS — F321 Major depressive disorder, single episode, moderate: Secondary | ICD-10-CM | POA: Insufficient documentation

## 2021-03-31 DIAGNOSIS — K219 Gastro-esophageal reflux disease without esophagitis: Secondary | ICD-10-CM

## 2021-03-31 DIAGNOSIS — Z23 Encounter for immunization: Secondary | ICD-10-CM

## 2021-03-31 DIAGNOSIS — E1169 Type 2 diabetes mellitus with other specified complication: Secondary | ICD-10-CM

## 2021-03-31 DIAGNOSIS — E119 Type 2 diabetes mellitus without complications: Secondary | ICD-10-CM

## 2021-03-31 LAB — URINALYSIS, ROUTINE W REFLEX MICROSCOPIC
Bilirubin Urine: NEGATIVE
Hgb urine dipstick: NEGATIVE
Leukocytes,Ua: NEGATIVE
Nitrite: NEGATIVE
RBC / HPF: NONE SEEN (ref 0–?)
Specific Gravity, Urine: 1.025 (ref 1.000–1.030)
Total Protein, Urine: 30 — AB
Urine Glucose: NEGATIVE
Urobilinogen, UA: 1 (ref 0.0–1.0)
pH: 5.5 (ref 5.0–8.0)

## 2021-03-31 LAB — CBC WITH DIFFERENTIAL/PLATELET
Basophils Absolute: 0 10*3/uL (ref 0.0–0.1)
Basophils Relative: 0.3 % (ref 0.0–3.0)
Eosinophils Absolute: 0.3 10*3/uL (ref 0.0–0.7)
Eosinophils Relative: 2.9 % (ref 0.0–5.0)
HCT: 40.1 % (ref 36.0–46.0)
Hemoglobin: 12.7 g/dL (ref 12.0–15.0)
Lymphocytes Relative: 16.9 % (ref 12.0–46.0)
Lymphs Abs: 1.5 10*3/uL (ref 0.7–4.0)
MCHC: 31.8 g/dL (ref 30.0–36.0)
MCV: 77.9 fl — ABNORMAL LOW (ref 78.0–100.0)
Monocytes Absolute: 0.5 10*3/uL (ref 0.1–1.0)
Monocytes Relative: 5.9 % (ref 3.0–12.0)
Neutro Abs: 6.8 10*3/uL (ref 1.4–7.7)
Neutrophils Relative %: 74 % (ref 43.0–77.0)
Platelets: 253 10*3/uL (ref 150.0–400.0)
RBC: 5.14 Mil/uL — ABNORMAL HIGH (ref 3.87–5.11)
RDW: 16.1 % — ABNORMAL HIGH (ref 11.5–15.5)
WBC: 9.1 10*3/uL (ref 4.0–10.5)

## 2021-03-31 LAB — VITAMIN B12: Vitamin B-12: 975 pg/mL — ABNORMAL HIGH (ref 211–911)

## 2021-03-31 LAB — HEMOGLOBIN A1C: Hgb A1c MFr Bld: 6.8 % — ABNORMAL HIGH (ref 4.6–6.5)

## 2021-03-31 LAB — FOLATE: Folate: >23.4 ng/mL (ref >5.9)

## 2021-03-31 MED ORDER — VILAZODONE HCL 10 MG PO TABS: 10.0000 mg | ORAL_TABLET | Freq: Every day | ORAL | 0 refills | Status: DC

## 2021-03-31 NOTE — Progress Notes (Signed)
Subjective:  Patient ID: Jamie Salazar, female    DOB: [birth year]  Age: 60 y.o. MRN: [id number]  CC: Diabetes  This visit occurred during the SARS-CoV-2 public health emergency.  Safety protocols were in place, including screening questions prior to the visit, additional usage of staff PPE, and extensive cleaning of exam room while observing appropriate contact time as indicated for disinfecting solutions.    HPI Jamie Salazar presents for f/up -  Her mother died 20 months ago and since then she has experienced worsening depression and anxiety.  She complains of insomnia, panic attacks, and anxiety.  She does not feel worthless, helpless, hopeless, or suicidal.  She tells me she has never been treated for depression.  Over the last few months she has also developed numbness in both feet.  She tells me her blood sugars are well controlled.  She is active and denies chest pain, shortness of breath, diaphoresis, or edema.  Outpatient Medications Prior to Visit  Medication Sig Dispense Refill   allopurinol (ZYLOPRIM) 100 MG tablet Take 1 tablet (100 mg total) by mouth daily. 90 tablet 0   cetirizine (ZYRTEC) 10 MG tablet       metoprolol succinate (TOPROL XL) 25 MG 24 hr tablet Take 1 tablet (25 mg total) by mouth every morning. Hold if systolic blood pressure (top number) less than 100 mmHg or pulse less than 60 bpm. 90 tablet 0   potassium chloride 20 MEQ/15ML (10%) SOLN Take 15 mLs (20 mEq total) by mouth 2 (two) times daily. (Patient taking differently: Take 10 mEq by mouth daily.) 3800 mL 1   rosuvastatin (CRESTOR) 5 MG tablet Take 1 tablet (5 mg total) by mouth daily. 90 tablet 1   tirzepatide (MOUNJARO) 5 MG/0.5ML Pen Inject 5 mg into the skin once a week. 6 mL 0   torsemide (DEMADEX) 20 MG tablet Take 1 tablet (20 mg total) by mouth daily. (Patient taking differently: Take 10 mg by mouth daily.) 90 tablet 0   No facility-administered medications prior to visit.     ROS Review of Systems  Constitutional:  Positive for unexpected weight change (wt gain). Negative for chills, diaphoresis and fatigue.  HENT: Negative.  Negative for trouble swallowing and voice change.        ++heartburn  Eyes: Negative.   Respiratory: Negative.  Negative for cough, shortness of breath and wheezing.   Cardiovascular:  Negative for chest pain, palpitations and leg swelling.  Gastrointestinal:  Negative for abdominal pain, blood in stool, constipation, diarrhea, nausea and vomiting.  Endocrine: Negative.   Genitourinary: Negative.  Negative for difficulty urinating.  Musculoskeletal: Negative.  Negative for myalgias.  Skin: Negative.   Neurological:  Negative for dizziness, weakness and light-headedness.  Hematological:  Negative for adenopathy. Does not bruise/bleed easily.  Psychiatric/Behavioral: Negative.     Objective:  BP 126/84 (BP Location: Right Arm, Patient Position: Sitting, Cuff Size: Large)   Pulse 98   Temp 97.9 F (36.6 C) (Oral)   Resp 16   Ht 5\' 2"  (1.575 m)   Wt (!) 336 lb (152.4 kg)   SpO2 99%   BMI 61.46 kg/m   BP Readings from Last 3 Encounters:  03/31/21 126/84  03/08/21 127/86  02/17/21 (!) 154/92    Wt Readings from Last 3 Encounters:  03/31/21 (!) 336 lb (152.4 kg)  03/08/21 (!) 343 lb 12.8 oz (155.9 kg)  02/17/21 (!) 353 lb (160.1 kg)    Physical Exam Vitals reviewed.  Constitutional:  Appearance: She is obese. She is ill-appearing.  HENT:     Nose: Nose normal.     Mouth/Throat:     Mouth: Mucous membranes are moist.  Eyes:     General: No scleral icterus.    Conjunctiva/sclera: Conjunctivae normal.  Cardiovascular:     Rate and Rhythm: Normal rate and regular rhythm.     Heart sounds: No murmur heard. Pulmonary:     Effort: Pulmonary effort is normal.     Breath sounds: No stridor. No wheezing, rhonchi or rales.  Abdominal:     General: Abdomen is protuberant. Bowel sounds are normal. There is no  distension.     Palpations: Abdomen is soft. There is no fluid wave, hepatomegaly, splenomegaly or mass.     Tenderness: There is no abdominal tenderness.  Musculoskeletal:        General: Normal range of motion.     Cervical back: Neck supple.     Right lower leg: No edema.     Left lower leg: No edema.  Lymphadenopathy:     Cervical: No cervical adenopathy.  Skin:    General: Skin is warm and dry.  Neurological:     General: No focal deficit present.     Mental Status: She is alert.  Psychiatric:        Attention and Perception: Attention and perception normal. She is attentive. She does not perceive auditory or visual hallucinations.        Mood and Affect: Mood is anxious and depressed. Affect is tearful. Affect is not flat.        Speech: Speech is not rapid and pressured, delayed or tangential.        Behavior: Behavior normal. Behavior is not agitated, slowed, aggressive or hyperactive. Behavior is cooperative.        Thought Content: Thought content normal. Thought content is not paranoid or delusional. Thought content does not include homicidal or suicidal ideation. Thought content does not include homicidal or suicidal plan.        Cognition and Memory: Cognition normal.        Judgment: Judgment normal.    Lab Results  Component Value Date   WBC 9.1 03/31/2021   HGB 12.7 03/31/2021   HCT 40.1 03/31/2021   PLT 253.0 03/31/2021   GLUCOSE 94 03/31/2021   CHOL 180 02/17/2021   TRIG 90.0 02/17/2021   HDL 70.90 02/17/2021   LDLCALC 91 02/17/2021   ALT 19 02/17/2021   AST 19 02/17/2021   NA 139 03/31/2021   K 4.0 03/31/2021   CL 100 03/31/2021   CREATININE 1.11 (H) 03/31/2021   BUN 12 03/31/2021   CO2 22 03/31/2021   TSH 2.25 02/17/2021   HGBA1C 6.8 (H) 03/31/2021    MM 3D SCREEN BREAST BILATERAL  Result Date: 02/15/2018 CLINICAL DATA:  Screening. EXAM: DIGITAL SCREENING BILATERAL MAMMOGRAM WITH TOMO AND CAD COMPARISON:  Previous exam(s). ACR Breast Density  Category a: The breast tissue is almost entirely fatty. FINDINGS: There are no findings suspicious for malignancy. Images were processed with CAD. IMPRESSION: No mammographic evidence of malignancy. A result letter of this screening mammogram will be mailed directly to the patient. RECOMMENDATION: Screening mammogram in one year. (Code:SM-B-01Y) BI-RADS CATEGORY  1: Negative. Electronically Signed   By: Margarette Canada M.D.   On: 02/15/2018 16:47    Assessment & Plan:   Ngan was seen today for diabetes.  Diagnoses and all orders for this visit:  Current moderate episode of major  depressive disorder without prior episode ([place])- Will start vilazadone and will increase the dose over time. -     Vilazodone HCl (VIIBRYD) 10 MG TABS; Take 1 tablet (10 mg total) by mouth daily for 7 days.  Numbness and tingling of both feet- Her B12 and folate are normal.  I think she has developed diabetic neuropathy. -     Vitamin B12; Future -     Folate; Future -     Folate -     Vitamin B12  Type II diabetes mellitus with manifestations ([place])- Her blood sugar is well controlled.  Will continue increasing the dose of the GLP/GIP antagonist. -     Hemoglobin A1c; Future -     HM Diabetes Foot Exam -     Microalbumin / creatinine urine ratio; Future -     Microalbumin / creatinine urine ratio -     Hemoglobin A1c  Essential hypertension- Her blood sugar is adequately well controlled. -     CBC with Differential/Platelet; Future -     Urinalysis, Routine w reflex microscopic; Future -     Urinalysis, Routine w reflex microscopic -     CBC with Differential/Platelet  Need for vaccination -     Pneumococcal conjugate vaccine 20-valent  Gastroesophageal reflux disease without esophagitis- I recommended that she start taking a PPI.  I am having Jamie Salazar "Vivien Rota" start on Vilazodone HCl. I am also having her maintain her cetirizine, allopurinol, torsemide, rosuvastatin, potassium chloride,  metoprolol succinate, and tirzepatide.  Meds ordered this encounter  Medications   Vilazodone HCl (VIIBRYD) 10 MG TABS    Sig: Take 1 tablet (10 mg total) by mouth daily for 7 days.    Dispense:  7 tablet    Refill:  0     Follow-up: Return in about 3 months (around 06/29/2021).  Scarlette Calico, MD

## 2021-03-31 NOTE — Patient Instructions (Signed)
Major Depressive Disorder, Adult °Major depressive disorder (MDD) is a mental health condition. It may also be called clinical depression or unipolar depression. MDD causes symptoms of sadness, hopelessness, and loss of interest in things. These symptoms last most of the day, almost every day, for 2 weeks. MDD can also cause physical symptoms. It can interfere with relationships and with everyday activities, such as work, school, and activities that are usually pleasant. °MDD may be mild, moderate, or severe. It may be single-episode MDD, which happens once, or recurrent MDD, which may occur multiple times. °What are the causes? °The exact cause of this condition is not known. MDD is most likely caused by a combination of things, which may include: °Your personality traits. °Learned or conditioned behaviors or thoughts or feelings that reinforce negativity. °Any alcohol or substance misuse. °Long-term (chronic) physical or mental health illness. °Going through a traumatic experience or major life changes. °What increases the risk? °The following factors may make someone more likely to develop MDD: °A family history of depression. °Being a woman. °Troubled family relationships. °Abnormally low levels of certain brain chemicals. °Traumatic or painful events in childhood, especially abuse or loss of a parent. °A lot of stress from life experiences, such as poor living conditions or discrimination. °Chronic physical illness or other mental health disorders. °What are the signs or symptoms? °The main symptoms of MDD usually include: °Constant depressed or irritable mood. °A loss of interest in things and activities. °Other symptoms include: °Sleeping or eating too much or too little. °Unexplained weight gain or weight loss. °Tiredness or low energy. °Being agitated, restless, or weak. °Feeling hopeless, worthless, or guilty. °Trouble thinking clearly or making decisions. °Thoughts of suicide or thoughts of harming  others. °Isolating oneself or avoiding other people or activities. °Trouble completing tasks, work, or any normal obligations. °Severe symptoms of this condition may include: °Psychotic depression.This may include false beliefs, or delusions. It may also include seeing, hearing, tasting, smelling, or feeling things that are not real (hallucinations). °Chronic depression or persistent depressive disorder. This is low-level depression that lasts for at least 2 years. °Melancholic depression, or feeling extremely sad and hopeless. °Catatonic depression, which includes trouble speaking and trouble moving. °How is this diagnosed? °This condition may be diagnosed based on: °Your symptoms. °Your medical and mental health history. You may be asked questions about your lifestyle, including any drug and alcohol use. °A physical exam. °Blood tests to rule out other conditions. °MDD is confirmed if you have the following symptoms most of the day, nearly every day, in a 2-week period: °Either a depressed mood or loss of interest. °At least four other MDD symptoms. °How is this treated? °This condition is usually treated by mental health professionals, such as psychologists, psychiatrists, and clinical social workers. You may need more than one type of treatment. Treatment may include: °Psychotherapy, also called talk therapy or counseling. Types of psychotherapy include: °Cognitive behavioral therapy (CBT). This teaches you to recognize unhealthy feelings, thoughts, and behaviors, and replace them with positive thoughts and actions. °Interpersonal therapy (IPT). This helps you to improve the way you communicate with others or relate to them. °Family therapy. This treatment includes members of your family. °Medicines to treat anxiety and depression. These medicines help to balance the brain chemicals that affect your emotions. °Lifestyle changes. You may be asked to: °Limit alcohol use and avoid drug use. °Get regular  exercise. °Get plenty of sleep. °Make healthy eating choices. °Spend more time outdoors. °Brain stimulation. This may   be done if symptoms are very severe and other treatments have not worked. Examples of this treatment are electroconvulsive therapy and transcranial magnetic stimulation. °Follow these instructions at home: °Activity °Exercise regularly and spend time outdoors. °Find activities that you enjoy doing, and make time to do them. °Find healthy ways to manage stress, such as: °Meditation or deep breathing. °Spending time in nature. °Journaling. °Return to your normal activities as told by your health care provider. Ask your health care provider what activities are safe for you. °Alcohol and drug use °If you drink alcohol: °Limit how much you use to: °0-1 drink a day for women who are not pregnant. °0-2 drinks a day for men. °Be aware of how much alcohol is in your drink. In the [place] one drink equals one 12 oz bottle of beer (355 mL), one 5 oz glass of wine (148 mL), or one 1½ oz glass of hard liquor (44 mL). °Discuss your alcohol use with your health care provider. Alcohol can affect any antidepressant medicines you are taking. °Discuss any drug use with your health care provider. °General instructions ° °Take over-the-counter and prescription medicines only as told by your health care provider. °Eat a healthy diet and get plenty of sleep. °Consider joining a support group. Your health care provider may be able to recommend one. °Keep all follow-up visits as told by your health care provider. This is important. °Where to find more information °National Alliance on Mental Illness: www.nami.org °[place] National Institute of Mental Health: www.nimh.nih.gov °Contact a health care provider if: °Your symptoms get worse. °You develop new symptoms. °Get help right away if: °You self-harm. °You have serious thoughts about hurting yourself or others. °You hallucinate. °If you ever feel like you may hurt yourself or  others, or have thoughts about taking your own life, get help right away. Go to your nearest emergency department or: °Call your local emergency services (911 in the [place] °Call a suicide crisis helpline, such as the National Suicide Prevention Lifeline at 1-800-273-8255 or 988 in the [place] This is open 24 hours a day in the [place] °Text the Crisis Text Line at 741741 (in the [place] °Summary °Major depressive disorder (MDD) is a mental health condition. MDD causes symptoms of sadness, hopelessness, and loss of interest in things. These symptoms last most of the day, almost every day, for 2 weeks. °The symptoms of MDD can interfere with relationships and with everyday activities. °Treatments and support are available for people who develop MDD. You may need more than one type of treatment. °Get help right away if you have serious thoughts about hurting yourself or others. °This information is not intended to replace advice given to you by your health care provider. Make sure you discuss any questions you have with your health care provider. °Document Revised: 11/03/2020 Document Reviewed: 03/22/2019 °Elsevier Patient Education © 2022 [employer]. ° °

## 2021-04-01 ENCOUNTER — Telehealth: Payer: Self-pay | Admitting: Internal Medicine

## 2021-04-01 DIAGNOSIS — K219 Gastro-esophageal reflux disease without esophagitis: Secondary | ICD-10-CM | POA: Insufficient documentation

## 2021-04-01 DIAGNOSIS — Z23 Encounter for immunization: Secondary | ICD-10-CM | POA: Insufficient documentation

## 2021-04-01 LAB — BASIC METABOLIC PANEL
BUN/Creatinine Ratio: 11 — ABNORMAL LOW (ref 12–28)
BUN: 12 mg/dL (ref 8–27)
CO2: 22 mmol/L (ref 20–29)
Calcium: 10.2 mg/dL (ref 8.7–10.3)
Chloride: 100 mmol/L (ref 96–106)
Creatinine, Ser: 1.11 mg/dL — ABNORMAL HIGH (ref 0.57–1.00)
Glucose: 94 mg/dL (ref 70–99)
Potassium: 4 mmol/L (ref 3.5–5.2)
Sodium: 139 mmol/L (ref 134–144)
eGFR: 57 mL/min/{1.73_m2} — ABNORMAL LOW (ref >59)

## 2021-04-01 LAB — MAGNESIUM: Magnesium: 2 mg/dL (ref 1.6–2.3)

## 2021-04-01 MED ORDER — ESOMEPRAZOLE MAGNESIUM 40 MG PO CPDR: 40.0000 mg | DELAYED_RELEASE_CAPSULE | Freq: Every day | ORAL | 1 refills | Status: DC

## 2021-04-01 NOTE — Telephone Encounter (Signed)
Patient says she recently had cardiac testing done & her calcium scoring showed 0  Patient says cardiology office advised her to make pcp aware so he can follow up w/ additional testing if needed  Please call patient [phone]

## 2021-04-01 NOTE — Progress Notes (Signed)
Called and spoke with patient regarding her CT cardiac scoring results. Patient will contact her PCP for further evaluation. Patient wanted me to let you know that she is currently going through menopause, and  and wants to know does this cause her heart rate to go way up and anxiety to be "at a 50", and hot and cold flashes. Please advise.

## 2021-04-04 ENCOUNTER — Other Ambulatory Visit: Payer: Self-pay | Admitting: Internal Medicine

## 2021-04-04 ENCOUNTER — Other Ambulatory Visit: Payer: Medicare Other

## 2021-04-04 ENCOUNTER — Telehealth: Payer: Self-pay | Admitting: Internal Medicine

## 2021-04-04 ENCOUNTER — Encounter: Payer: Self-pay | Admitting: Internal Medicine

## 2021-04-04 DIAGNOSIS — E1140 Type 2 diabetes mellitus with diabetic neuropathy, unspecified: Secondary | ICD-10-CM

## 2021-04-04 MED ORDER — PREGABALIN 50 MG PO CAPS: 50.0000 mg | ORAL_CAPSULE | Freq: Three times a day (TID) | ORAL | 0 refills | Status: DC

## 2021-04-04 NOTE — Telephone Encounter (Signed)
Patient calling in  Patient says she is still experiencing severe pain & burning in her right foot.. says her foot feels like it is "cut in half" .. still swollen & burning  Patient would like provider to submit referral to a Podiatrist to discuss issue she is having w/ her foot  Please let patient know when referral has been submitted.Marland Kitchen also requesting medication to relieve the pain & swelling until she is able to see specialist   [facility] [place] [place] - [address] Phone:  [phone]  Fax:  [phone]

## 2021-04-05 ENCOUNTER — Encounter: Payer: Self-pay | Admitting: Podiatry

## 2021-04-05 ENCOUNTER — Ambulatory Visit (INDEPENDENT_AMBULATORY_CARE_PROVIDER_SITE_OTHER): Payer: Medicare Other | Admitting: Podiatry

## 2021-04-05 ENCOUNTER — Other Ambulatory Visit: Payer: Self-pay

## 2021-04-05 DIAGNOSIS — E1140 Type 2 diabetes mellitus with diabetic neuropathy, unspecified: Secondary | ICD-10-CM | POA: Diagnosis not present

## 2021-04-05 DIAGNOSIS — E118 Type 2 diabetes mellitus with unspecified complications: Secondary | ICD-10-CM | POA: Diagnosis not present

## 2021-04-05 DIAGNOSIS — M79674 Pain in right toe(s): Secondary | ICD-10-CM

## 2021-04-05 DIAGNOSIS — B351 Tinea unguium: Secondary | ICD-10-CM | POA: Diagnosis not present

## 2021-04-05 DIAGNOSIS — M10471 Other secondary gout, right ankle and foot: Secondary | ICD-10-CM | POA: Diagnosis not present

## 2021-04-05 DIAGNOSIS — M79675 Pain in left toe(s): Secondary | ICD-10-CM

## 2021-04-05 NOTE — Progress Notes (Signed)
Pt called back and was informed about her lab results. Pt understood

## 2021-04-05 NOTE — Progress Notes (Signed)
Please see message above, from [place]

## 2021-04-05 NOTE — Progress Notes (Signed)
Called pt no answer, left a vm

## 2021-04-05 NOTE — Progress Notes (Signed)
°  Subjective:  Patient ID: Jamie Salazar, female    DOB: 1960-12-03,  MRN: [id number]  Chief Complaint  Patient presents with   Numbness      (np) bil nunbness near the bottom of the foot/right foot swelling   Diabetes    Diabetic foot care, A1C  6.8    60 y.o. female presents with the above complaint. History confirmed with patient.  Nails are thickened elongated and causing discomfort as well  Objective:  Physical Exam: warm, good capillary refill, no trophic changes or ulcerative lesions, normal sensory exam, and abnormal sensation to monofilament. Left Foot: dystrophic yellowed discolored nail plates with subungual debris Right Foot: dystrophic yellowed discolored nail plates with subungual debris, pain and swelling and dorsal right midfoot Assessment:   1. Acute gout due to other secondary cause involving toe of right foot   2. Chronic painful diabetic neuropathy (HCC)   3. Pain due to onychomycosis of toenails of both feet   4. Type II diabetes mellitus with manifestations ([place])      Plan:  Patient was evaluated and treated and all questions answered.  Patient educated on diabetes. Discussed proper diabetic foot care and discussed risks and complications of disease. Educated patient in depth on reasons to return to the office immediately should he/she discover anything concerning or new on the feet. All questions answered. Discussed proper shoes as well.   Discussed the etiology and treatment options for the condition in detail with the patient. Educated patient on the topical and oral treatment options for mycotic nails. Recommended debridement of the nails today. Sharp and mechanical debridement performed of all painful and mycotic nails today. Nails debrided in length and thickness using a nail nipper to level of comfort. Discussed treatment options including appropriate shoe gear. Follow up as needed for painful nails.  Currently seems to be having a gout flare of  the right foot.  She has colchicine prescription at home.  Advised to take 2 tablets and then 1 tablet 1 hour after.  If this does not improve she will call me for prednisone taper  Return in about 1 year (around 04/05/2022) for diabetic foot exam .

## 2021-04-06 NOTE — Progress Notes (Signed)
Attempted to call pt, no answer. Left vm requesting call back. If pt calls back please advise her to refer the above questions to her PCP.

## 2021-04-11 ENCOUNTER — Ambulatory Visit: Payer: Medicare Other | Admitting: Nurse Practitioner

## 2021-04-11 ENCOUNTER — Telehealth: Payer: Self-pay | Admitting: Podiatry

## 2021-04-11 MED ORDER — COLCHICINE 0.6 MG PO TABS: ORAL_TABLET | ORAL | 2 refills | Status: AC

## 2021-04-11 NOTE — Telephone Encounter (Signed)
Pharmacy : [facility] [address]  Medication:  colchicine   Patient called in requesting a refill she only has 4 left

## 2021-04-12 ENCOUNTER — Telehealth: Payer: Self-pay | Admitting: Internal Medicine

## 2021-04-12 ENCOUNTER — Ambulatory Visit: Payer: Medicare Other | Admitting: Cardiology

## 2021-04-12 ENCOUNTER — Encounter: Payer: Self-pay | Admitting: Cardiology

## 2021-04-12 ENCOUNTER — Other Ambulatory Visit: Payer: Self-pay

## 2021-04-12 VITALS — BP 146/81 | HR 92 | Resp 16 | Ht 62.0 in | Wt 331.0 lb

## 2021-04-12 DIAGNOSIS — E1169 Type 2 diabetes mellitus with other specified complication: Secondary | ICD-10-CM

## 2021-04-12 DIAGNOSIS — E119 Type 2 diabetes mellitus without complications: Secondary | ICD-10-CM

## 2021-04-12 DIAGNOSIS — R0602 Shortness of breath: Secondary | ICD-10-CM

## 2021-04-12 NOTE — Progress Notes (Signed)
Date:  04/12/2021   ID:  Jamie Salazar 05-Apr-1961, MRN [id number]  PCP:  Jamie Lima, MD  Cardiologist:  Rex Kras, DO, [facility] (established care 03/08/21)  Date: 04/12/21 Last Office Visit: 03/08/2021  Chief Complaint  Patient presents with   Results   Follow-up    HPI  Jamie Salazar is a 60 y.o. African-American female who presents to the office with a chief complaint of " shortness of breath and abnormal EKG." Patient's past medical history and cardiovascular risk factors include: NIDDM, HTN, Hyperlipidemia, obesity due to excess calories.   She is referred to the office at the request of Jamie Lima, MD for evaluation of Abnormal EKG.  Patient's Sister Jamie Salazar is present over the phone during today's encounter.  At the last office visit patient was also complaining of symptoms of expressive aphasia and vision changes and therefore patient underwent an extended Holter monitor to evaluate for atrial fibrillation. Results reviewed with the patient at today's office visit, Predominately normal sinus rhythm without evidence of A. fib.  Patient was recently diagnosed with type 2 diabetes patient was started on statin therapy and tolerating the medication well.  She was recommended to undergo coronary artery calcium score for further risk ratification and her total CAC is 0.  Patient continues to implement lifestyle changes with diuretics patient has lost approximately 23 pounds.  However, she has developed a gout flare and is started on medical therapy.  I suspect this is likely due to her underlying torsemide.  She was prescribed torsemide 20 mg p.o. daily which I had reduced to 10 mg p.o. daily due to renal function and this was further reduced to 10 mg p.o. Monday Wednesday Friday.  I have asked the patient to follow-up with PCP to see if torsemide is still indicated as opposed to transitioning to ACE inhibitor's/ARB given her diabetes.  Patient  denies any chest pain or shortness of breath at rest or with effort related activities.   FUNCTIONAL STATUS: No structured exercise program or daily routine.   ALLERGIES: Allergies  Allergen Reactions   Lisinopril Cough   Ciprofloxacin Hcl     Hallucination     MEDICATION LIST PRIOR TO VISIT: Current Meds  Medication Sig   allopurinol (ZYLOPRIM) 100 MG tablet Take 1 tablet (100 mg total) by mouth daily.   cetirizine (ZYRTEC) 10 MG tablet     colchicine 0.6 MG tablet Take 1.2mg  (2 tablets) then 0.6mg  (1 tablet) 1 hour after. Then, take 1 tablet every day for 7 days.   metoprolol succinate (TOPROL XL) 25 MG 24 hr tablet Take 1 tablet (25 mg total) by mouth every morning. Hold if systolic blood pressure (top number) less than 100 mmHg or pulse less than 60 bpm.   potassium chloride 20 MEQ/15ML (10%) SOLN Take 15 mLs (20 mEq total) by mouth 2 (two) times daily. (Patient taking differently: Take 10 mEq by mouth daily.)   rosuvastatin (CRESTOR) 5 MG tablet Take 1 tablet (5 mg total) by mouth daily.   tirzepatide [facility] [place]) 5 MG/0.5ML Pen Inject 5 mg into the skin once a week.   torsemide (DEMADEX) 20 MG tablet Take 1 tablet (20 mg total) by mouth daily. (Patient taking differently: Take 10 mg by mouth daily.)     PAST MEDICAL HISTORY: Past Medical History:  Diagnosis Date   Allergy    Zyrtec daily.   Anxiety    Arthritis    disability   Depression  Diabetes mellitus without complication ([place])    Hyperlipidemia    Hypertension    Obesity, morbid ([place])     PAST SURGICAL HISTORY: Past Surgical History:  Procedure Laterality Date   CHOLECYSTECTOMY     gallbladder      FAMILY HISTORY: The patient family history includes Diabetes in her mother; Heart disease in her mother; Hypertension in her mother; Lupus in her mother.  SOCIAL HISTORY:  The patient  reports that she has never smoked. Her smokeless tobacco use includes snuff. She reports that she does not drink alcohol and  does not use drugs.  REVIEW OF SYSTEMS: Review of Systems  Constitutional: Negative for chills and fever.  HENT:  Negative for hoarse voice and nosebleeds.   Eyes:  Negative for discharge, double vision and pain.  Cardiovascular:  Positive for dyspnea on exertion (improving). Negative for chest pain, claudication, leg swelling, near-syncope, orthopnea, palpitations, paroxysmal nocturnal dyspnea and syncope.  Respiratory:  Negative for hemoptysis and shortness of breath.   Musculoskeletal:  Negative for muscle cramps and myalgias.  Gastrointestinal:  Negative for abdominal pain, constipation, diarrhea, hematemesis, hematochezia, melena, nausea and vomiting.  Neurological:  Negative for dizziness and light-headedness.   PHYSICAL EXAM: Vitals with BMI 04/12/2021 03/31/2021 03/08/2021  Height 5\' 2"  5\' 2"  5\' 2"   Weight 331 lbs 336 lbs 343 lbs 13 oz  BMI 60.53 40.34 74.25  Systolic 956 387 564  Diastolic 81 84 86  Pulse 92 98 119    CONSTITUTIONAL: Well-developed and well-nourished. No acute distress.  SKIN: Skin is warm and dry. No rash noted. No cyanosis. No pallor. No jaundice HEAD: Normocephalic and atraumatic.  EYES: No scleral icterus MOUTH/THROAT: Moist oral membranes.  NECK: No JVD present. No thyromegaly noted. No carotid bruits  LYMPHATIC: No visible cervical adenopathy.  CHEST Normal respiratory effort. No intercostal retractions  LUNGS: Clear to auscultation bilaterally.  No stridor. No wheezes. No rales.  CARDIOVASCULAR: Tachycardia, regular, positive S1-S2, no murmurs rubs or gallops appreciated. ABDOMINAL: Obese, soft, nontender, nondistended, positive bowel sounds in all 4 quadrants, no apparent ascites.  EXTREMITIES: No peripheral edema, warm to touch,  HEMATOLOGIC: No significant bruising NEUROLOGIC: Oriented to person, place, and time. Nonfocal. Normal muscle tone.  PSYCHIATRIC: Normal mood and affect. Normal behavior. Cooperative  CARDIAC  DATABASE: EKG: 03/08/2021: Sinus tachycardia, 115 bpm, poor R wave progression, without underlying injury pattern.   Echocardiogram: No results found for this or any previous visit from the past 1095 days.   Stress Testing: No results found for this or any previous visit from the past 1095 days.  Heart Catheterization: None  Coronary calcium score report 03/31/2021 No visible coronary artery calcifications. Total coronary calcium score of 0.   Left pleural lipoma.   Scattered low-density lesions in the liver, difficult to characterize. Recommend further evaluation with right upper quadrant ultrasound or contrast-enhanced abdominal CT.  14 day extended Holter monitor: Dominant rhythm normal sinus. Heart rate 56-169 bpm. Avg HR 84 bpm. No atrial fibrillation, ventricular tachycardia, high grade AV block, pauses (3 seconds or longer). Total ventricular ectopic burden <1%. Total supraventricular ectopic burden <1%. Rare episodes of supraventricular tachycardia.  Patient triggered events: 0.   LABORATORY DATA: CBC Latest Ref Rng & Units 03/31/2021 02/17/2021 02/02/2017  WBC 4.0 - 10.5 K/uL 9.1 10.6(H) 11.6(H)  Hemoglobin 12.0 - 15.0 g/dL 12.7 13.0 13.2  Hematocrit 36.0 - 46.0 % 40.1 40.3 40.6  Platelets 150.0 - 400.0 K/uL 253.0 245.0 321    CMP Latest Ref Rng &  Units 03/31/2021 03/21/2021 03/08/2021  Glucose 70 - 99 mg/dL 94 122(H) 122(H)  BUN 8 - 27 mg/dL 12 13 25   Creatinine 0.57 - 1.00 mg/dL 1.11(H) 1.25(H) 1.56(H)  Sodium 134 - 144 mmol/L 139 141 140  Potassium 3.5 - 5.2 mmol/L 4.0 4.2 4.8  Chloride 96 - 106 mmol/L 100 101 98  CO2 20 - 29 mmol/L 22 21 23   Calcium 8.7 - 10.3 mg/dL 10.2 9.8 10.0  Total Protein 6.0 - 8.3 g/dL - - -  Total Bilirubin 0.2 - 1.2 mg/dL - - -  Alkaline Phos 39 - 117 U/L - - -  AST 0 - 37 U/L - - -  ALT 0 - 35 U/L - - -    Lipid Panel     Component Value Date/Time   CHOL 180 02/17/2021 1414   TRIG 90.0 02/17/2021 1414   HDL 70.90  02/17/2021 1414   CHOLHDL 3 02/17/2021 1414   VLDL 18.0 02/17/2021 1414   LDLCALC 91 02/17/2021 1414    No components found for: NTPROBNP Recent Labs    02/17/21 1414  PROBNP 18.0   Recent Labs    02/17/21 1414  TSH 2.25    BMP Recent Labs    03/08/21 1221 03/21/21 1326 03/31/21 1144  NA 140 141 139  K 4.8 4.2 4.0  CL 98 101 100  CO2 23 21 22   GLUCOSE 122* 122* 94  BUN 25 13 12   CREATININE 1.56* 1.25* 1.11*  CALCIUM 10.0 9.8 10.2    HEMOGLOBIN A1C Lab Results  Component Value Date   HGBA1C 6.8 (H) 03/31/2021   MPG 148 02/02/2017    IMPRESSION:    ICD-10-CM   1. Shortness of breath  R06.02     2. Non-insulin dependent type 2 diabetes mellitus ([place])  E11.9     3. Type 2 diabetes mellitus with hyperlipidemia [facility])  E11.69    E78.5        RECOMMENDATIONS: Tanica Gaige is a 61 y.o. African-American female whose past medical history and cardiac risk factors include: NIDDM, HTN, Hyperlipidemia, obesity due to excess calories.   Since establishing care patient has undergone 14-day extended Holter monitor to evaluate for dysrhythmias including atrial fibrillation for reasons mentioned above.  Patient is noted to have an average heart rate of 84 bpm without any significant dysrhythmias.  Given the new onset of diabetes mellitus type 2 and multiple other cardiovascular risk factors she did undergo a coronary calcium score and is noted to have a total CAC of 0.  I would still recommend statin therapy given her underlying diabetes mellitus as prescribed by PCP.  Patient was noted to have incidental findings on the coronary calcium report of the left pleural lipoma and low-density lesions in the liver these findings were conveyed to the patient and her sister during today's encounter.  I have asked him to follow-up with PCP for additional testing if clinically warranted.  In addition, initially patient was on torsemide 20 mg p.o. daily due to shortness of  breath.  However, repeat blood work and noted acute kidney injury and therefore reduce the dose of torsemide to 10 mg p.o. daily.  This was further reduced to 10 mg p.o. Monday Wednesday and Friday.  I suspect that she is overall euvolemic and diuresed very well.  And given the new onset of gout it may be reasonable to transitioning her off of torsemide and onto GDMT given her diabetes and other comorbid conditions.  Patient is asked  to follow-up with PCP.  Would recommend continuing with the original echo that was ordered at the last visit to evaluate for LVEF and structural heart disease.  As long as the LVEF is within acceptable range and no significant valvular heart disease I would recommend medical therapy given her chronic comorbid conditions.  If patient is noted to have cardiomyopathy or new onset of chest pain or worsening shortness of breath would recommend ischemic evaluation sooner.  Since establishing care patient has lost approximately 23 pounds.  She is congratulated on her efforts and her progress through her weight loss journey.  I would like to see her back in 6 months to reevaluate her symptoms and how she has progressed during her weight loss journey.  Patient and sister are agreeable with the plan of care and are thankful for the care provided.  FINAL MEDICATION LIST END OF ENCOUNTER: No orders of the defined types were placed in this encounter.    Medications Discontinued During This Encounter  Medication Reason   esomeprazole (NEXIUM) 40 MG capsule    pregabalin (LYRICA) 50 MG capsule    Vilazodone HCl (VIIBRYD) 10 MG TABS      Current Outpatient Medications:    allopurinol (ZYLOPRIM) 100 MG tablet, Take 1 tablet (100 mg total) by mouth daily., Disp: 90 tablet, Rfl: 0   cetirizine (ZYRTEC) 10 MG tablet,  , Disp: , Rfl:    colchicine 0.6 MG tablet, Take 1.2mg  (2 tablets) then 0.6mg  (1 tablet) 1 hour after. Then, take 1 tablet every day for 7 days., Disp: 10 tablet, Rfl:  2   metoprolol succinate (TOPROL XL) 25 MG 24 hr tablet, Take 1 tablet (25 mg total) by mouth every morning. Hold if systolic blood pressure (top number) less than 100 mmHg or pulse less than 60 bpm., Disp: 90 tablet, Rfl: 0   potassium chloride 20 MEQ/15ML (10%) SOLN, Take 15 mLs (20 mEq total) by mouth 2 (two) times daily. (Patient taking differently: Take 10 mEq by mouth daily.), Disp: 3800 mL, Rfl: 1   rosuvastatin (CRESTOR) 5 MG tablet, Take 1 tablet (5 mg total) by mouth daily., Disp: 90 tablet, Rfl: 1   tirzepatide (MOUNJARO) 5 MG/0.5ML Pen, Inject 5 mg into the skin once a week., Disp: 6 mL, Rfl: 0   torsemide (DEMADEX) 20 MG tablet, Take 1 tablet (20 mg total) by mouth daily. (Patient taking differently: Take 10 mg by mouth daily.), Disp: 90 tablet, Rfl: 0  No orders of the defined types were placed in this encounter.   There are no Patient Instructions on file for this visit.   --Continue cardiac medications as reconciled in final medication list. --Return in about 6 months (around 10/11/2021) for Follow up, Dyspnea. Or sooner if needed. --Continue follow-up with your primary care physician regarding the management of your other chronic comorbid conditions.  Patient's questions and concerns were addressed to her satisfaction. She voices understanding of the instructions provided during this encounter.   This note was created using a voice recognition software as a result there may be grammatical errors inadvertently enclosed that do not reflect the nature of this encounter. Every attempt is made to correct such errors.  Rex Kras, [place], [facility]  Pager: [phone] Office: [phone]

## 2021-04-12 NOTE — Telephone Encounter (Signed)
Patient states she can not swallow pregablin and esomeprazole medication capsules  Patient requesting a call back to discuss alternative forms of medications and informed provider of visit w/ heart doctor

## 2021-04-12 NOTE — Telephone Encounter (Signed)
LMOM letting patient know that prescription has been called in .

## 2021-04-13 ENCOUNTER — Ambulatory Visit
Admission: RE | Admit: 2021-04-13 | Discharge: 2021-04-13 | Disposition: A | Discharge status: Home / Self Care | Payer: Medicare Other | Source: Ambulatory Visit | Attending: Internal Medicine | Admitting: Internal Medicine

## 2021-04-13 DIAGNOSIS — Z1231 Encounter for screening mammogram for malignant neoplasm of breast: Secondary | ICD-10-CM

## 2021-04-14 ENCOUNTER — Other Ambulatory Visit: Payer: Self-pay | Admitting: Internal Medicine

## 2021-04-14 DIAGNOSIS — R928 Other abnormal and inconclusive findings on diagnostic imaging of breast: Secondary | ICD-10-CM

## 2021-04-24 NOTE — Progress Notes (Deleted)
New Patient Note  RE: Jamie Salazar MRN: [id number] DOB: May 27, 1960 Date of Office Visit: 04/25/2021  Consult requested by: Janith Lima, MD Primary care provider: Janith Lima, MD  Chief Complaint: No chief complaint on file.  History of Present Illness: I had the pleasure of seeing Jamie Salazar for initial evaluation at the [facility] [place] on 04/24/2021. She is a 61 y.o. female, who is referred here by Janith Lima, MD for the evaluation of allergic rhinitis.  She reports symptoms of ***. Symptoms have been going on for *** years. The symptoms are present *** all year around with worsening in ***. Other triggers include exposure to ***. Anosmia: ***. Headache: ***. She has used *** with ***fair improvement in symptoms. Sinus infections: ***. Previous work up includes: ***. Previous ENT evaluation: ***. Previous sinus imaging: ***. History of nasal polyps: ***. Last eye exam: ***. History of reflux: ***.  02/17/2021 PCP visit: "She has had seasonal allergies for many years and complains of a chronic cough.  Her allergies have been more severe recently.  Her cough is productive of clear phlegm.  She complains of a scratchy sensation in her throat with postnasal drip and nasal congestion.  She is not getting much symptom relief with Zyrtec.  She would like to be tested for allergies.  She has a history of hypertension and is taking an ACE inhibitor and a loop diuretic.  She complains of weight gain but denies chest pain, diaphoresis, dizziness, or lightheadedness."  Assessment and Plan: Jamie Salazar is a 61 y.o. female with: No problem-specific Assessment & Plan notes found for this encounter.  No follow-ups on file.  No orders of the defined types were placed in this encounter.  Lab Orders  No laboratory test(s) ordered today    Other allergy screening: Asthma: {Blank single:19197::"yes","no"} Rhino conjunctivitis: {Blank  single:19197::"yes","no"} Food allergy: {Blank single:19197::"yes","no"} Medication allergy: {Blank single:19197::"yes","no"} Hymenoptera allergy: {Blank single:19197::"yes","no"} Urticaria: {Blank single:19197::"yes","no"} Eczema:{Blank single:19197::"yes","no"} History of recurrent infections suggestive of immunodeficency: {Blank single:19197::"yes","no"}  Diagnostics: Spirometry:  Tracings reviewed. Her effort: {Blank single:19197::"Good reproducible efforts.","It was hard to get consistent efforts and there is a question as to whether this reflects a maximal maneuver.","Poor effort, data can not be interpreted."} FVC: ***L FEV1: ***L, ***% predicted FEV1/FVC ratio: ***% Interpretation: {Blank single:19197::"Spirometry consistent with mild obstructive disease","Spirometry consistent with moderate obstructive disease","Spirometry consistent with severe obstructive disease","Spirometry consistent with possible restrictive disease","Spirometry consistent with mixed obstructive and restrictive disease","Spirometry uninterpretable due to technique","Spirometry consistent with normal pattern","No overt abnormalities noted given today's efforts"}.  Please see scanned spirometry results for details.  Skin Testing: {Blank single:19197::"Select foods","Environmental allergy panel","Environmental allergy panel and select foods","Food allergy panel","None","Deferred due to recent antihistamines use"}. *** Results discussed with patient/family.   Past Medical History: Patient Active Problem List   Diagnosis Date Noted   Chronic painful diabetic neuropathy ([place]) 04/04/2021   Gastroesophageal reflux disease without esophagitis 04/01/2021   Need for vaccination 04/01/2021   Current moderate episode of major depressive disorder without prior episode ([place]) 03/31/2021   Numbness and tingling of both feet 03/31/2021   Screening for cervical cancer 02/18/2021   Type II diabetes mellitus with  manifestations ([place]) 02/18/2021   Encounter for general adult medical examination with abnormal findings 02/17/2021   Hyperlipidemia with target LDL less than 130 02/17/2021   Seasonal allergic rhinitis due to pollen 02/17/2021   Abnormal EKG 02/17/2021   Allergic rhinitis 02/17/2021   Idiopathic chronic gout of multiple sites without tophus  02/17/2021   Screen for colon cancer 02/17/2021   Visit for screening mammogram 02/17/2021   Varicose veins 04/28/2011   Mild hyperparathyroidism ([place]) 02/22/2011   MORBID OBESITY 05/20/2009   Essential hypertension 05/20/2009   ALLERGIC RHINITIS 05/20/2009   Osteoarthritis of both knees 05/20/2009   Past Medical History:  Diagnosis Date   Allergy    Zyrtec daily.   Anxiety    Arthritis    disability   Depression    Diabetes mellitus without complication ([place])    Hyperlipidemia    Hypertension    Obesity, morbid ([place])    Past Surgical History: Past Surgical History:  Procedure Laterality Date   CHOLECYSTECTOMY     gallbladder     Medication List:  Current Outpatient Medications  Medication Sig Dispense Refill   allopurinol (ZYLOPRIM) 100 MG tablet Take 1 tablet (100 mg total) by mouth daily. 90 tablet 0   cetirizine (ZYRTEC) 10 MG tablet       colchicine 0.6 MG tablet Take 1.2mg  (2 tablets) then 0.6mg  (1 tablet) 1 hour after. Then, take 1 tablet every day for 7 days. 10 tablet 2   metoprolol succinate (TOPROL XL) 25 MG 24 hr tablet Take 1 tablet (25 mg total) by mouth every morning. Hold if systolic blood pressure (top number) less than 100 mmHg or pulse less than 60 bpm. 90 tablet 0   potassium chloride 20 MEQ/15ML (10%) SOLN Take 15 mLs (20 mEq total) by mouth 2 (two) times daily. (Patient taking differently: Take 10 mEq by mouth daily.) 3800 mL 1   rosuvastatin (CRESTOR) 5 MG tablet Take 1 tablet (5 mg total) by mouth daily. 90 tablet 1   tirzepatide (MOUNJARO) 5 MG/0.5ML Pen Inject 5 mg into the skin  once a week. 6 mL 0   torsemide (DEMADEX) 20 MG tablet Take 1 tablet (20 mg total) by mouth daily. (Patient taking differently: Take 10 mg by mouth daily.) 90 tablet 0   No current facility-administered medications for this visit.   Allergies: Allergies  Allergen Reactions   Lisinopril Cough   Ciprofloxacin Hcl     Hallucination    Social History: Social History   Socioeconomic History   Marital status: Single    Spouse name: Not on file   Number of children: 0   Years of education: Not on file   Highest education level: Not on file  Occupational History   Occupation: disability    Comment: OA, depression  Tobacco Use   Smoking status: Never   Smokeless tobacco: Current    Types: Snuff  Vaping Use   Vaping Use: Never used  Substance and Sexual Activity   Alcohol use: No   Drug use: No   Sexual activity: Never  Other Topics Concern   Not on file  Social History Narrative   Marital status: single; not dating and not interested.      Children: none      Lives: alone      Employment: disabled due to OA, depression/anxiety      Tobacco:           Social Determinants of Radio broadcast assistant Strain: Not on file  Food Insecurity: Not on file  Transportation Needs: Not on file  Physical Activity: Not on file  Stress: Not on file  Social Connections: Not on file   Lives in a ***. Smoking: *** Occupation: ***  Environmental History: Water Damage/mildew in the house: {Blank single:19197::"yes","no"} Carpet in the family room: {  Blank single:19197::"yes","no"} Carpet in the bedroom: {Blank single:19197::"yes","no"} Heating: {Blank single:19197::"electric","gas","heat pump"} Cooling: {Blank single:19197::"central","window","heat pump"} Pet: {Blank single:19197::"yes ***","no"}  Family History: Family History  Problem Relation Age of Onset   Lupus Mother    Diabetes Mother    Hypertension Mother    Heart disease Mother        stents  placed 30.   Breast cancer Neg Hx    Problem                               Relation Asthma                                   *** Eczema                                *** Food allergy                          *** Allergic rhino conjunctivitis     ***  Review of Systems  Constitutional:  Negative for appetite change, chills, fever and unexpected weight change.  HENT:  Negative for congestion and rhinorrhea.   Eyes:  Negative for itching.  Respiratory:  Negative for cough, chest tightness, shortness of breath and wheezing.   Cardiovascular:  Negative for chest pain.  Gastrointestinal:  Negative for abdominal pain.  Genitourinary:  Negative for difficulty urinating.  Skin:  Negative for rash.  Neurological:  Negative for headaches.   Objective: There were no vitals taken for this visit. There is no height or weight on file to calculate BMI. Physical Exam Vitals and nursing note reviewed.  Constitutional:      Appearance: Normal appearance. She is well-developed.  HENT:     Head: Normocephalic and atraumatic.     Right Ear: Tympanic membrane and external ear normal.     Left Ear: Tympanic membrane and external ear normal.     Nose: Nose normal.     Mouth/Throat:     Mouth: Mucous membranes are moist.     Pharynx: Oropharynx is clear.  Eyes:     Conjunctiva/sclera: Conjunctivae normal.  Cardiovascular:     Rate and Rhythm: Normal rate and regular rhythm.     Heart sounds: Normal heart sounds. No murmur heard.   No friction rub. No gallop.  Pulmonary:     Effort: Pulmonary effort is normal.     Breath sounds: Normal breath sounds. No wheezing, rhonchi or rales.  Musculoskeletal:     Cervical back: Neck supple.  Skin:    General: Skin is warm.     Findings: No rash.  Neurological:     Mental Status: She is alert and oriented to person, place, and time.  Psychiatric:        Behavior: Behavior normal.  The plan was reviewed with the patient/family, and all  questions/concerned were addressed.  It was my pleasure to see Jamie Salazar today and participate in her care. Please feel free to contact me with any questions or concerns.  Sincerely,  Rexene Alberts, DO Allergy & Immunology  [facility] office: [place] office: [phone]

## 2021-04-25 ENCOUNTER — Ambulatory Visit: Payer: Medicare Other | Admitting: Allergy

## 2021-04-26 ENCOUNTER — Other Ambulatory Visit: Payer: Medicare Other

## 2021-05-02 ENCOUNTER — Telehealth: Payer: Self-pay | Admitting: *Deleted

## 2021-05-02 NOTE — Telephone Encounter (Signed)
Let's have her scheduled then, we may want to do an injection or further studies

## 2021-05-02 NOTE — Telephone Encounter (Signed)
Please contact patient to schedule

## 2021-05-02 NOTE — Telephone Encounter (Signed)
Patient is calling because her feet seems to be getting worse after taking the colchicine, allopurinol. She has discontinued both medications. Please advise.

## 2021-05-03 ENCOUNTER — Ambulatory Visit: Payer: No Typology Code available for payment source | Admitting: Podiatry

## 2021-05-04 ENCOUNTER — Ambulatory Visit: Payer: No Typology Code available for payment source | Admitting: Internal Medicine

## 2021-05-10 ENCOUNTER — Ambulatory Visit
Admission: RE | Admit: 2021-05-10 | Discharge: 2021-05-10 | Disposition: A | Discharge status: Home / Self Care | Payer: Medicare Other | Source: Ambulatory Visit | Attending: Internal Medicine | Admitting: Internal Medicine

## 2021-05-10 ENCOUNTER — Other Ambulatory Visit: Payer: Self-pay

## 2021-05-10 DIAGNOSIS — R928 Other abnormal and inconclusive findings on diagnostic imaging of breast: Secondary | ICD-10-CM

## 2021-05-16 ENCOUNTER — Other Ambulatory Visit: Payer: Self-pay

## 2021-05-16 ENCOUNTER — Ambulatory Visit: Payer: Medicare Other

## 2021-05-25 ENCOUNTER — Other Ambulatory Visit: Payer: Medicare Other

## 2021-05-27 ENCOUNTER — Other Ambulatory Visit: Payer: Self-pay | Admitting: Internal Medicine

## 2021-05-27 DIAGNOSIS — I10 Essential (primary) hypertension: Secondary | ICD-10-CM

## 2021-05-27 DIAGNOSIS — M1A09X0 Idiopathic chronic gout, multiple sites, without tophus (tophi): Secondary | ICD-10-CM

## 2021-05-30 ENCOUNTER — Other Ambulatory Visit: Payer: Self-pay | Admitting: Internal Medicine

## 2021-05-30 DIAGNOSIS — E118 Type 2 diabetes mellitus with unspecified complications: Secondary | ICD-10-CM

## 2021-06-01 ENCOUNTER — Ambulatory Visit (INDEPENDENT_AMBULATORY_CARE_PROVIDER_SITE_OTHER): Payer: Medicare Other | Admitting: Internal Medicine

## 2021-06-01 ENCOUNTER — Encounter: Payer: Self-pay | Admitting: Internal Medicine

## 2021-06-01 ENCOUNTER — Other Ambulatory Visit: Payer: Self-pay

## 2021-06-01 VITALS — BP 140/98 | HR 93 | Temp 97.9°F | Ht 62.0 in | Wt 322.0 lb

## 2021-06-01 DIAGNOSIS — M170 Bilateral primary osteoarthritis of knee: Secondary | ICD-10-CM | POA: Diagnosis not present

## 2021-06-01 DIAGNOSIS — I10 Essential (primary) hypertension: Secondary | ICD-10-CM | POA: Diagnosis not present

## 2021-06-01 DIAGNOSIS — E1140 Type 2 diabetes mellitus with diabetic neuropathy, unspecified: Secondary | ICD-10-CM | POA: Diagnosis not present

## 2021-06-01 DIAGNOSIS — E1121 Type 2 diabetes mellitus with diabetic nephropathy: Secondary | ICD-10-CM

## 2021-06-01 DIAGNOSIS — E213 Hyperparathyroidism, unspecified: Secondary | ICD-10-CM

## 2021-06-01 DIAGNOSIS — F331 Major depressive disorder, recurrent, moderate: Secondary | ICD-10-CM | POA: Insufficient documentation

## 2021-06-01 DIAGNOSIS — D490 Neoplasm of unspecified behavior of digestive system: Secondary | ICD-10-CM | POA: Insufficient documentation

## 2021-06-01 DIAGNOSIS — E118 Type 2 diabetes mellitus with unspecified complications: Secondary | ICD-10-CM | POA: Diagnosis not present

## 2021-06-01 LAB — URINALYSIS, ROUTINE W REFLEX MICROSCOPIC
Bilirubin Urine: NEGATIVE
Hgb urine dipstick: NEGATIVE
Ketones, ur: NEGATIVE
Leukocytes,Ua: NEGATIVE
Nitrite: NEGATIVE
RBC / HPF: NONE SEEN (ref 0–?)
Specific Gravity, Urine: 1.01 (ref 1.000–1.030)
Total Protein, Urine: NEGATIVE
Urine Glucose: NEGATIVE
Urobilinogen, UA: 0.2 (ref 0.0–1.0)
pH: 6 (ref 5.0–8.0)

## 2021-06-01 MED ORDER — TIRZEPATIDE 7.5 MG/0.5ML ~~LOC~~ SOAJ
7.5000 mg | SUBCUTANEOUS | 0 refills | Status: DC
Start: 2021-06-01 — End: 2021-08-24

## 2021-06-01 MED ORDER — TRAMADOL HCL 50 MG PO TABS: 50.0000 mg | ORAL_TABLET | Freq: Four times a day (QID) | ORAL | 5 refills | Status: DC | PRN

## 2021-06-01 MED ORDER — DULOXETINE HCL 30 MG PO CPEP
30.0000 mg | ORAL_CAPSULE | Freq: Every day | ORAL | 0 refills | Status: DC
Start: 2021-06-01 — End: 2021-06-02

## 2021-06-01 NOTE — Patient Instructions (Signed)

## 2021-06-01 NOTE — Progress Notes (Signed)
Subjective:  Patient ID: Jamie Salazar, female    DOB: 1961/03/10  Age: 61 y.o. MRN: [id number]  CC: Hypertension, Diabetes, and Osteoarthritis  This visit occurred during the SARS-CoV-2 public health emergency.  Safety protocols were in place, including screening questions prior to the visit, additional usage of staff PPE, and extensive cleaning of exam room while observing appropriate contact time as indicated for disinfecting solutions.    HPI Jamie Salazar presents for f/up -  She continues to struggle with chronic knee pain, peripheral neuropathy pain, anxiety, and depression.  She denies chest pain, shortness of breath, or edema.  She recently had a CT done that showed some insignificant lesions in her liver.  She is losing weight with Mounjaro.  Outpatient Medications Prior to Visit  Medication Sig Dispense Refill   allopurinol (ZYLOPRIM) 100 MG tablet TAKE 1 TABLET(100 MG) BY MOUTH DAILY 90 tablet 0   cetirizine (ZYRTEC) 10 MG tablet       colchicine 0.6 MG tablet Take 1.2mg  (2 tablets) then 0.6mg  (1 tablet) 1 hour after. Then, take 1 tablet every day for 7 days. 10 tablet 2   metoprolol succinate (TOPROL XL) 25 MG 24 hr tablet Take 1 tablet (25 mg total) by mouth every morning. Hold if systolic blood pressure (top number) less than 100 mmHg or pulse less than 60 bpm. 90 tablet 0   potassium chloride 20 MEQ/15ML (10%) SOLN Take 15 mLs (20 mEq total) by mouth 2 (two) times daily. (Patient taking differently: Take 10 mEq by mouth daily.) 3800 mL 1   rosuvastatin (CRESTOR) 5 MG tablet Take 1 tablet (5 mg total) by mouth daily. 90 tablet 1   torsemide (DEMADEX) 20 MG tablet TAKE 1 TABLET(20 MG) BY MOUTH DAILY (Patient taking differently: 10 mg.) 90 tablet 0   tirzepatide (MOUNJARO) 5 MG/0.5ML Pen Inject 5 mg into the skin once a week. 6 mL 0   No facility-administered medications prior to visit.    ROS Review of Systems  Constitutional:  Negative for chills, diaphoresis,  fatigue and fever.  HENT: Negative.    Eyes: Negative.   Respiratory: Negative.  Negative for cough, shortness of breath and wheezing.   Cardiovascular:  Positive for leg swelling. Negative for chest pain and palpitations.  Gastrointestinal:  Negative for abdominal pain, blood in stool, constipation, diarrhea and vomiting.  Endocrine: Negative.   Genitourinary:  Negative for difficulty urinating, dysuria and hematuria.  Musculoskeletal:  Positive for arthralgias. Negative for back pain and myalgias.  Skin:  Negative for color change and rash.  Neurological:  Negative for dizziness, weakness and light-headedness.  Hematological:  Negative for adenopathy. Does not bruise/bleed easily.  Psychiatric/Behavioral:  Positive for decreased concentration and dysphoric mood. Negative for behavioral problems, confusion, hallucinations, self-injury, sleep disturbance and suicidal ideas. The patient is nervous/anxious.    Objective:  BP (!) 140/98 (BP Location: Left Arm, Patient Position: Sitting, Cuff Size: Large)    Pulse 93    Temp 97.9 F (36.6 C) (Oral)    Ht 5\' 2"  (1.575 m)    Wt (!) 322 lb (146.1 kg)    SpO2 99%    BMI 58.89 kg/m   BP Readings from Last 3 Encounters:  06/02/21 (!) 151/85  06/01/21 (!) 140/98  04/12/21 (!) 146/81    Wt Readings from Last 3 Encounters:  06/02/21 (!) 322 lb 6.4 oz (146.2 kg)  06/01/21 (!) 322 lb (146.1 kg)  04/12/21 (!) 331 lb (150.1 kg)    Physical  Exam Vitals reviewed.  Constitutional:      Appearance: She is obese.  HENT:     Mouth/Throat:     Mouth: Mucous membranes are moist.  Eyes:     General: No scleral icterus.    Conjunctiva/sclera: Conjunctivae normal.  Cardiovascular:     Rate and Rhythm: Normal rate and regular rhythm.     Heart sounds: No murmur heard.   No friction rub. No gallop.  Pulmonary:     Effort: Pulmonary effort is normal.     Breath sounds: No stridor. No wheezing, rhonchi or rales.  Abdominal:     General: Abdomen is  protuberant. Bowel sounds are normal. There is no distension.     Palpations: Abdomen is soft. There is no fluid wave, hepatomegaly, splenomegaly or mass.     Tenderness: There is no abdominal tenderness. There is no guarding or rebound.  Musculoskeletal:        General: Deformity (severe DJD both kness) present.     Cervical back: Neck supple.     Right lower leg: 1+ Pitting Edema present.     Left lower leg: 1+ Pitting Edema present.  Lymphadenopathy:     Cervical: No cervical adenopathy.  Skin:    General: Skin is warm and dry.  Neurological:     General: No focal deficit present.     Mental Status: She is alert.  Psychiatric:        Mood and Affect: Mood normal.        Behavior: Behavior normal.    Lab Results  Component Value Date   WBC 9.1 03/31/2021   HGB 12.7 03/31/2021   HCT 40.1 03/31/2021   PLT 253.0 03/31/2021   GLUCOSE 94 03/31/2021   CHOL 180 02/17/2021   TRIG 90.0 02/17/2021   HDL 70.90 02/17/2021   LDLCALC 91 02/17/2021   ALT 16 06/01/2021   AST 19 06/01/2021   NA 139 03/31/2021   K 4.0 03/31/2021   CL 100 03/31/2021   CREATININE 1.11 (H) 03/31/2021   BUN 12 03/31/2021   CO2 22 03/31/2021   TSH 2.25 02/17/2021   HGBA1C 6.8 (H) 03/31/2021   MICROALBUR 6.8 (H) 06/01/2021    US BREAST LTD UNI LEFT INC AXILLA  Result Date: 05/10/2021 CLINICAL DATA:  Screening recall for a possible left breast mass. EXAM: DIGITAL DIAGNOSTIC UNILATERAL LEFT MAMMOGRAM WITH TOMOSYNTHESIS AND CAD; ULTRASOUND LEFT BREAST LIMITED TECHNIQUE: Left digital diagnostic mammography and breast tomosynthesis was performed. The images were evaluated with computer-aided detection.; Targeted ultrasound examination of the left breast was performed. COMPARISON:  Previous exam(s). ACR Breast Density Category a: The breast tissue is almost entirely fatty. FINDINGS: The possible mass noted in the medial left breast persists on the spot compression diagnostic images. It appears as a small oval  circumscribed mass measuring 5 mm, projecting at approximately 9 o'clock. Targeted ultrasound is performed, showing a small cyst, with a few thin internal septations, but no other complicating features, at 9:30 o'clock, 7 cm from the nipple, middle depth, measuring 5 x 4 x 4 mm, consistent in size, shape and location to the mammographic mass. There are no solid masses or suspicious lesions. IMPRESSION: 1. No evidence of breast malignancy. 2. Benign left breast cyst. RECOMMENDATION: Screening mammogram in one year.(Code:SM-B-01Y) I have discussed the findings and recommendations with the patient. If applicable, a reminder letter will be sent to the patient regarding the next appointment. BI-RADS CATEGORY  2: Benign. Electronically Signed   By: Lajean Manes  M.D.   On: 05/10/2021 13:44  MM DIAG BREAST TOMO UNI LEFT  Result Date: 05/10/2021 CLINICAL DATA:  Screening recall for a possible left breast mass. EXAM: DIGITAL DIAGNOSTIC UNILATERAL LEFT MAMMOGRAM WITH TOMOSYNTHESIS AND CAD; ULTRASOUND LEFT BREAST LIMITED TECHNIQUE: Left digital diagnostic mammography and breast tomosynthesis was performed. The images were evaluated with computer-aided detection.; Targeted ultrasound examination of the left breast was performed. COMPARISON:  Previous exam(s). ACR Breast Density Category a: The breast tissue is almost entirely fatty. FINDINGS: The possible mass noted in the medial left breast persists on the spot compression diagnostic images. It appears as a small oval circumscribed mass measuring 5 mm, projecting at approximately 9 o'clock. Targeted ultrasound is performed, showing a small cyst, with a few thin internal septations, but no other complicating features, at 9:30 o'clock, 7 cm from the nipple, middle depth, measuring 5 x 4 x 4 mm, consistent in size, shape and location to the mammographic mass. There are no solid masses or suspicious lesions. IMPRESSION: 1. No evidence of breast malignancy. 2. Benign left  breast cyst. RECOMMENDATION: Screening mammogram in one year.(Code:SM-B-01Y) I have discussed the findings and recommendations with the patient. If applicable, a reminder letter will be sent to the patient regarding the next appointment. BI-RADS CATEGORY  2: Benign. Electronically Signed   By: Lajean Manes M.D.   On: 05/10/2021 13:44   IMPRESSION: No visible coronary artery calcifications. Total coronary calcium score of 0.   Left pleural lipoma.   Scattered low-density lesions in the liver, difficult to characterize. Recommend further evaluation with right upper quadrant ultrasound or contrast-enhanced abdominal CT.     Electronically Signed   By: Rolm Baptise M.D.   On: 03/31/2021 15:37   Assessment & Plan:   Cathlin was seen today for hypertension, diabetes and osteoarthritis.  Diagnoses and all orders for this visit:  Mild hyperparathyroidism ([place])  Chronic painful diabetic neuropathy ([place]) -     Discontinue: DULoxetine (CYMBALTA) 30 MG capsule; Take 1 capsule (30 mg total) by mouth daily. -     traMADol (ULTRAM) 50 MG tablet; Take 1 tablet (50 mg total) by mouth every 6 (six) hours as needed.  Type II diabetes mellitus with manifestations (HCC) -     tirzepatide (MOUNJARO) 7.5 MG/0.5ML Pen; Inject 7.5 mg into the skin once a week. -     Microalbumin / creatinine urine ratio; Future -     Urinalysis, Routine w reflex microscopic; Future -     Ambulatory referral to Ophthalmology -     Urinalysis, Routine w reflex microscopic -     Microalbumin / creatinine urine ratio  Osteoarthritis of both knees, unspecified osteoarthritis type -     Discontinue: DULoxetine (CYMBALTA) 30 MG capsule; Take 1 capsule (30 mg total) by mouth daily. -     traMADol (ULTRAM) 50 MG tablet; Take 1 tablet (50 mg total) by mouth every 6 (six) hours as needed.  Moderate episode of recurrent major depressive disorder (HCC) -     Discontinue: DULoxetine (CYMBALTA) 30 MG capsule; Take 1 capsule  (30 mg total) by mouth daily.  Essential hypertension -     Microalbumin / creatinine urine ratio; Future -     Microalbumin / creatinine urine ratio  Hepatic tumor- Her liver enzymes then AFP are normal.  I do not think this requires any further work-up. -     Hepatic function panel; Future -     AFP tumor marker; Future -     AFP  tumor marker -     Hepatic function panel   I have discontinued Jamie Salazar "Jamie Salazar"'s tirzepatide. I am also having her start on tirzepatide and traMADol. Additionally, I am having her maintain her cetirizine, rosuvastatin, potassium chloride, metoprolol succinate, colchicine, allopurinol, and torsemide.  Meds ordered this encounter  Medications   tirzepatide (MOUNJARO) 7.5 MG/0.5ML Pen    Sig: Inject 7.5 mg into the skin once a week.    Dispense:  6 mL    Refill:  0   DISCONTD: DULoxetine (CYMBALTA) 30 MG capsule    Sig: Take 1 capsule (30 mg total) by mouth daily.    Dispense:  30 capsule    Refill:  0   traMADol (ULTRAM) 50 MG tablet    Sig: Take 1 tablet (50 mg total) by mouth every 6 (six) hours as needed.    Dispense:  90 tablet    Refill:  5     Follow-up: Return in about 3 months (around 08/29/2021).  Scarlette Calico, MD

## 2021-06-02 ENCOUNTER — Encounter: Payer: Self-pay | Admitting: Neurology

## 2021-06-02 ENCOUNTER — Ambulatory Visit (INDEPENDENT_AMBULATORY_CARE_PROVIDER_SITE_OTHER): Payer: Medicare Other | Admitting: Neurology

## 2021-06-02 VITALS — BP 151/85 | HR 87 | Ht 62.0 in | Wt 322.4 lb

## 2021-06-02 DIAGNOSIS — M1A0790 Idiopathic chronic gout, unspecified ankle and foot, without tophus (tophi): Secondary | ICD-10-CM | POA: Diagnosis not present

## 2021-06-02 DIAGNOSIS — G6289 Other specified polyneuropathies: Secondary | ICD-10-CM | POA: Diagnosis not present

## 2021-06-02 DIAGNOSIS — E1142 Type 2 diabetes mellitus with diabetic polyneuropathy: Secondary | ICD-10-CM | POA: Diagnosis not present

## 2021-06-02 LAB — HEPATIC FUNCTION PANEL
ALT: 16 U/L (ref 0–35)
AST: 19 U/L (ref 0–37)
Albumin: 4 g/dL (ref 3.5–5.2)
Alkaline Phosphatase: 102 U/L (ref 39–117)
Bilirubin, Direct: 0 mg/dL (ref 0.0–0.3)
Total Bilirubin: 0.5 mg/dL (ref 0.2–1.2)
Total Protein: 8.5 g/dL — ABNORMAL HIGH (ref 6.0–8.3)

## 2021-06-02 LAB — AFP TUMOR MARKER: AFP-Tumor Marker: 3.9 ng/mL

## 2021-06-02 LAB — MICROALBUMIN / CREATININE URINE RATIO
Creatinine,U: 65.6 mg/dL
Microalb Creat Ratio: 10.4 mg/g (ref 0.0–30.0)
Microalb, Ur: 6.8 mg/dL — ABNORMAL HIGH (ref 0.0–1.9)

## 2021-06-02 NOTE — Progress Notes (Signed)
[facility]    Provider:  Dr Jaynee Eagles Requesting Provider: Janith Lima, MD Primary Care Provider:  Janith Lima, MD  CC: Painful diabetic neuropathy  HPI:  Jamie Salazar is a 61 y.o. female here as requested by Janith Lima, MD for diabetic neuropathy. PMHx anxiety, arthritis, depression, diabetes, hyperlipidemia, hypertension, obesity, varicose veins, chronic painful neuropathy diabetic, mild hyperparathyroidism, osteoarthritis, gout.   Says she doesn't know why she is here, Dr. Marcello Moores made the appointment without her knowledge. She has pain in her feet, she has gout, a foot doctor said her pain was gout, it can be in her big toe, severe, out of proportion to what it looks like, feels like someone is cutting her foot in half, she has some numbness in the feet but that is not the problem she says it is gout. She gets gout when she "cheats when eating", she also had some chronic kidney disease, she is improved since changing diet and changing medications to help creatinine, she is having an attack now, the symptoms come in "attacks" and not consistent, toes get red during attacks, in between attacks her feet are not painful. No weakness, no falls, no paresthesias. No other focal neurologic deficits, associated symptoms, inciting events or modifiable factors.   Reviewed notes, labs and imaging from outside physicians, which showed:  From a thorough review of records, medications tried that can be used for diabetic neuropathic pain include: Cymbalta, Lyrica, tramadol.  XR foot right 2013: reviewed images and agree: Clinical Data: .  Per foot 2-3 weeks ago.  Pain.   RIGHT FOOT COMPLETE - 3+ VIEW   Comparison: None.   Findings: The joints of the foot are aligned.  No acute or healing  fracture is identified.  Joint spaces are maintained.   IMPRESSION:  No acute bony abnormality.   Hemoglobin A1c 6.8 2 months ago, 7.1 3 months ago, 6.8 4 years ago, 6.2 5 years  ago.  B12 975.  TSH normal. BUN 11, creatinine 1.11 (improved from 1.56 2 onths ago)  Review of Systems: Patient complains of symptoms per HPI as well as the following symptoms gout. Pertinent negatives and positives per HPI. All others negative.   Social History   Socioeconomic History   Marital status: Single    Spouse name: Not on file   Number of children: 0   Years of education: Not on file   Highest education level: Not on file  Occupational History   Occupation: disability    Comment: OA, depression  Tobacco Use   Smoking status: Never   Smokeless tobacco: Current    Types: Snuff  Vaping Use   Vaping Use: Never used  Substance and Sexual Activity   Alcohol use: No   Drug use: No   Sexual activity: Never  Other Topics Concern   Not on file  Social History Narrative   Marital status: single; not dating and not interested.      Children: none      Lives: alone      Employment: disabled due to OA, depression/anxiety      Tobacco:           Social Determinants of Radio broadcast assistant Strain: Not on file  Food Insecurity: Not on file  Transportation Needs: Not on file  Physical Activity: Not on file  Stress: Not on file  Social Connections: Not on file  Intimate Partner Violence: Not on file    Family History  Problem Relation Age of Onset   Lupus Mother    Diabetes Mother    Hypertension Mother    Heart disease Mother        stents placed 14.   Neuropathy Mother    Breast cancer Neg Hx     Past Medical History:  Diagnosis Date   Allergy    Zyrtec daily.   Anxiety    Arthritis    disability   Depression    Diabetes mellitus without complication ([place])    Hyperlipidemia    Hypertension    Obesity, morbid ([place])     Patient Active Problem List   Diagnosis Date Noted   Diabetic polyneuropathy associated with type 2 diabetes mellitus ([place]) 06/02/2021   Chronic idiopathic gout involving toe without tophus 06/02/2021   Hepatic tumor  06/01/2021   Moderate episode of recurrent major depressive disorder ([place]) 06/01/2021   Chronic painful diabetic neuropathy ([place]) 04/04/2021   Gastroesophageal reflux disease without esophagitis 04/01/2021   Current moderate episode of major depressive disorder without prior episode ([place]) 03/31/2021   Screening for cervical cancer 02/18/2021   Type II diabetes mellitus with manifestations ([place]) 02/18/2021   Encounter for general adult medical examination with abnormal findings 02/17/2021   Hyperlipidemia with target LDL less than 130 02/17/2021   Seasonal allergic rhinitis due to pollen 02/17/2021   Abnormal EKG 02/17/2021   Allergic rhinitis 02/17/2021   Idiopathic chronic gout of multiple sites without tophus 02/17/2021   Screen for colon cancer 02/17/2021   Visit for screening mammogram 02/17/2021   Varicose veins 04/28/2011   Mild hyperparathyroidism ([place]) 02/22/2011   MORBID OBESITY 05/20/2009   Essential hypertension 05/20/2009   ALLERGIC RHINITIS 05/20/2009   Osteoarthritis of both knees 05/20/2009    Past Surgical History:  Procedure Laterality Date   CHOLECYSTECTOMY     gallbladder      Current Outpatient Medications  Medication Sig Dispense Refill   allopurinol (ZYLOPRIM) 100 MG tablet TAKE 1 TABLET(100 MG) BY MOUTH DAILY 90 tablet 0   cetirizine (ZYRTEC) 10 MG tablet       colchicine 0.6 MG tablet Take 1.42m (2 tablets) then 0.638m(1 tablet) 1 hour after. Then, take 1 tablet every day for 7 days. 10 tablet 2   metoprolol succinate (TOPROL XL) 25 MG 24 hr tablet Take 1 tablet (25 mg total) by mouth every morning. Hold if systolic blood pressure (top number) less than 100 mmHg or pulse less than 60 bpm. 90 tablet 0   potassium chloride 20 MEQ/15ML (10%) SOLN Take 15 mLs (20 mEq total) by mouth 2 (two) times daily. (Patient taking differently: Take 10 mEq by mouth daily.) 3800 mL 1   rosuvastatin (CRESTOR) 5 MG tablet Take 1 tablet (5 mg total) by mouth daily. 90 tablet 1    tirzepatide (MOUNJARO) 7.5 MG/0.5ML Pen Inject 7.5 mg into the skin once a week. 6 mL 0   torsemide (DEMADEX) 20 MG tablet TAKE 1 TABLET(20 MG) BY MOUTH DAILY (Patient taking differently: 10 mg.) 90 tablet 0   traMADol (ULTRAM) 50 MG tablet Take 1 tablet (50 mg total) by mouth every 6 (six) hours as needed. 90 tablet 5   No current facility-administered medications for this visit.    Allergies as of 06/02/2021 - Review Complete 06/02/2021  Allergen Reaction Noted   Lisinopril Cough 02/17/2021   Ciprofloxacin hcl  01/10/2014    Vitals: BP (!) 151/85    Pulse 87    Ht '5\' 2"'  (1.575 m)  Wt (!) 322 lb 6.4 oz (146.2 kg)    BMI 58.97 kg/m  Last Weight:  Wt Readings from Last 1 Encounters:  06/02/21 (!) 322 lb 6.4 oz (146.2 kg)   Last Height:   Ht Readings from Last 1 Encounters:  06/02/21 '5\' 2"'  (1.575 m)     Physical exam: Exam: Gen: NAD, conversant, well nourised, morbid obese, well groomed                     CV: RRR, no MRG. No Carotid Bruits. No peripheral edema, warm, nontender Eyes: Conjunctivae clear without exudates or hemorrhage  Neuro: Detailed Neurologic Exam  Speech:    Speech is normal; fluent and spontaneous with normal comprehension.  Cognition:    The patient is oriented to person, place, and time;     recent and remote memory intact;     language fluent;     normal attention, concentration,     fund of knowledge Cranial Nerves:    The pupils are equal, round, and reactive to light. Pupils too small to visualize fundi Visual fields are full to finger confrontation. Extraocular movements are intact. Trigeminal sensation is intact and the muscles of mastication are normal. The face is symmetric. The palate elevates in the midline. Hearing intact. Voice is normal. Shoulder shrug is normal. The tongue has normal motion without fasciculations.   Coordination:    Normal   Gait:    Wide based due to large body habitus  Motor Observation:    No asymmetry, no  atrophy, and no involuntary movements noted.Hammer toes.  Tone:    Normal muscle tone.    Posture:    Posture is normal. normal erect    Strength:    Strength is V/V in the upper and lower limbs.      Sensation: intact to LT, pin prick, vibration, cold distally in the feet     Reflex Exam:  DTR's:    Deep tendon reflexes in the upper and lower extremities are symmetrical  bilaterally, difficult to fully assess due to morbid obesity but no abnormality appreciated Toes:    The toes are downgoing bilaterally.   Clonus:    Clonus is absent.    Assessment/Plan:  61 y.o. female here as requested by Janith Lima, MD for diabetic neuropathy. PMHx anxiety, arthritis, depression, diabetes, hyperlipidemia, hypertension, obesity, varicose veins, chronic painful neuropathy diabetic, mild hyperparathyroidism, osteoarthritis, gout.   - Her sensory exam distally is completely normal to pin prick, temp, vibration so any diabetic neuropathy is likely extremely mild. Return to pcp.   - She has pain in her feet which she says is gout, a foot doctor saw her, she may have some mild numbness in the toes that is not bothersome or painful,  but she has severe attacks of gout, and it sounds like gout as well to me. it can be in her big toe, severe, out of proportion to what it looks like, feels like someone is cutting her foot in half, she has some numbness in the feet but that is not the problem she says it is gout attacks. She gets gout when she "cheats when eating", she also had some chronic kidney disease, creatinine  is improved since changing diet and medications, on mounjaro losing weight, the symptoms come in "attacks" and not consistent with diabetic neuropathy likely gout, toes get red during attacks, in between attacks her feet are not painful.   - BEST thing to prevent worsening  of diabetic neuropathy is do what you are doing, control HgbA1c and diet.   -May consider daily alpha lipoic acid which  is an antioxidant that may reduce free radical oxidative stress associated with diabetic polyneuropathy, existing evidence suggests that alpha lipoic acid significantly reduces stabbing, lancinating and burning pain and diabetic neuropathy with its onset of action as early as 1-2 weeks. 400-654m 1-2x a day  - Gout - diet, weight loss, medications (only take colchicine as needed because long term continuous use can damage nerves)  -Patient with diabetes and morbid obesity which are 2 independent risk factors for the development of neuropathy.  We can check several other causes, B12 is already been checked and was normal.  Orders Placed This Encounter  Procedures   Vitamin B6   Multiple Myeloma Panel (SPEP&IFE w/QIG)   Vitamin B1   No orders of the defined types were placed in this encounter.   Cc: JJanith Lima MD,  JJanith Lima MD  ASarina Ill MD  [facility] [address] Estate[place]ry [place] [address] Phone [phone] [phone]

## 2021-06-02 NOTE — Patient Instructions (Addendum)
- BEST thing to prevent worsening of diabetic neuropathy is do what you are doing, control HgbA1c and diet -May consider daily alpha lipoic acid which is an antioxidant that may reduce free radical oxidative stress associated with diabetic polyneuropathy, existing evidence suggests that alpha lipoic acid significantly reduces stabbing, lancinating and burning pain and diabetic neuropathy with its onset of action as early as 1-2 weeks. 400-600mg  1-2x a day - Gout - diet, weight loss, medications (only take colchicine as needed because long term continuous use can damage nerves)    Peripheral Neuropathy Peripheral neuropathy is a type of nerve damage. It affects nerves that carry signals between the spinal cord and the arms, legs, and the rest of the body (peripheral nerves). It does not affect nerves in the spinal cord or brain. In peripheral neuropathy, one nerve or a group of nerves may be damaged. Peripheral neuropathy is a broad category that includes many specific nerve disorders, like diabetic neuropathy, hereditary neuropathy, and carpal tunnel syndrome. What are the causes? This condition may be caused by: Diabetes. This is the most common cause of peripheral neuropathy. Nerve injury. Pressure or stress on a nerve that lasts a long time. Lack (deficiency) of B vitamins. This can result from alcoholism, poor diet, or a restricted diet. Infections. Autoimmune diseases, such as rheumatoid arthritis and systemic lupus erythematosus. Nerve diseases that are passed from parent to child (inherited). Some medicines, such as cancer medicines (chemotherapy). Poisonous (toxic) substances, such as lead and mercury. Too little blood flowing to the legs. Kidney disease. Thyroid disease. Morbid obesity In some cases, the cause of this condition is not known. What are the signs or symptoms? Symptoms of this condition depend on which of your nerves is damaged. Common symptoms include: Loss of feeling  (numbness) in the feet, hands, or both. Tingling in the feet, hands, or both. Burning pain. Very sensitive skin. Weakness. Not being able to move a part of the body (paralysis). Muscle twitching. Clumsiness or poor coordination. Loss of balance. Not being able to control your bladder. Feeling dizzy. Sexual problems. How is this diagnosed? Diagnosing and finding the cause of peripheral neuropathy can be difficult. Your health care provider will take your medical history and do a physical exam. A neurological exam will also be done. This involves checking things that are affected by your brain, spinal cord, and nerves (nervous system). For example, your health care provider will check your reflexes, how you move, and what you can feel. You may have other tests, such as: Blood tests. Electromyogram (EMG) and nerve conduction tests. These tests check nerve function and how well the nerves are controlling the muscles. Imaging tests, such as CT scans or MRI to rule out other causes of your symptoms. Removing a small piece of nerve to be examined in a lab (nerve biopsy). Removing and examining a small amount of the fluid that surrounds the brain and spinal cord (lumbar puncture). How is this treated? Treatment for this condition may involve: Treating the underlying cause of the neuropathy, such as diabetes, kidney disease, or vitamin deficiencies. Stopping medicines that can cause neuropathy, such as chemotherapy. Medicine to help relieve pain. Medicines may include: Prescription or over-the-counter pain medicine. Antiseizure medicine. Antidepressants. Pain-relieving patches that are applied to painful areas of skin. Surgery to relieve pressure on a nerve or to destroy a nerve that is causing pain. Physical therapy to help improve movement and balance. Devices to help you move around (assistive devices). Follow these instructions at home:  Medicines Take over-the-counter and prescription  medicines only as told by your health care provider. Do not take any other medicines without first asking your health care provider. Do not drive or use heavy machinery while taking prescription pain medicine. Lifestyle  Do not use any products that contain nicotine or tobacco, such as cigarettes and e-cigarettes. Smoking keeps blood from reaching damaged nerves. If you need help quitting, ask your health care provider. Avoid or limit alcohol. Too much alcohol can cause a vitamin B deficiency, and vitamin B is needed for healthy nerves. Eat a healthy diet. This includes: Eating foods that are high in fiber, such as fresh fruits and vegetables, whole grains, and beans. Limiting foods that are high in fat and processed sugars, such as fried or sweet foods. General instructions  If you have diabetes, work closely with your health care provider to keep your blood sugar under control. If you have numbness in your feet: Check every day for signs of injury or infection. Watch for redness, warmth, and swelling. Wear padded socks and comfortable shoes. These help protect your feet. Develop a good support system. Living with peripheral neuropathy can be stressful. Consider talking with a mental health specialist or joining a support group. Use assistive devices and attend physical therapy as told by your health care provider. This may include using a walker or a cane. Keep all follow-up visits as told by your health care provider. This is important. Contact a health care provider if: You have new signs or symptoms of peripheral neuropathy. You are struggling emotionally from dealing with peripheral neuropathy. Your pain is not well-controlled. Get help right away if: You have an injury or infection that is not healing normally. You develop new weakness in an arm or leg. You have fallen or do so frequently. Summary Peripheral neuropathy is when the nerves in the arms, or legs are damaged, resulting  in numbness, weakness, or pain. There are many causes of peripheral neuropathy, including diabetes, pinched nerves, vitamin deficiencies, autoimmune disease, and hereditary conditions. Diagnosing and finding the cause of peripheral neuropathy can be difficult. Your health care provider will take your medical history, do a physical exam, and do tests, including blood tests and nerve function tests. Treatment involves treating the underlying cause of the neuropathy and taking medicines to help control pain. Physical therapy and assistive devices may also help. This information is not intended to replace advice given to you by your health care provider. Make sure you discuss any questions you have with your health care provider. Document Revised: 01/20/2020 Document Reviewed: 01/20/2020 Elsevier Patient Education  2022 [employer].

## 2021-06-03 ENCOUNTER — Encounter: Payer: Self-pay | Admitting: Internal Medicine

## 2021-06-03 DIAGNOSIS — E1121 Type 2 diabetes mellitus with diabetic nephropathy: Secondary | ICD-10-CM | POA: Insufficient documentation

## 2021-06-03 MED ORDER — KERENDIA 10 MG PO TABS: 1.0000 | ORAL_TABLET | Freq: Every day | ORAL | 0 refills | Status: DC

## 2021-06-03 NOTE — Addendum Note (Signed)
Addended by: Janith Lima on: 06/03/2021 03:45 PM   Modules accepted: Orders

## 2021-06-06 ENCOUNTER — Telehealth: Payer: Self-pay

## 2021-06-06 NOTE — Telephone Encounter (Signed)
Key: [id number]

## 2021-06-06 NOTE — Telephone Encounter (Signed)
approved through 04/23/2022. ?

## 2021-06-07 LAB — MULTIPLE MYELOMA PANEL, SERUM
Albumin SerPl Elph-Mcnc: 3.7 g/dL (ref 2.9–4.4)
Albumin/Glob SerPl: 0.9 (ref 0.7–1.7)
Alpha 1: 0.3 g/dL (ref 0.0–0.4)
Alpha2 Glob SerPl Elph-Mcnc: 0.8 g/dL (ref 0.4–1.0)
B-Globulin SerPl Elph-Mcnc: 1.1 g/dL (ref 0.7–1.3)
Gamma Glob SerPl Elph-Mcnc: 2.1 g/dL — ABNORMAL HIGH (ref 0.4–1.8)
Globulin, Total: 4.3 g/dL — ABNORMAL HIGH (ref 2.2–3.9)
IgA/Immunoglobulin A, Serum: 246 mg/dL (ref 87–352)
IgG (Immunoglobin G), Serum: 2083 mg/dL — ABNORMAL HIGH (ref 586–1602)
IgM (Immunoglobulin M), Srm: 188 mg/dL (ref 26–217)
Total Protein: 8 g/dL (ref 6.0–8.5)

## 2021-06-07 LAB — VITAMIN B6: Vitamin B6: 4.1 ug/L (ref 3.4–65.2)

## 2021-06-07 LAB — VITAMIN B1: Thiamine: 124.1 nmol/L (ref 66.5–200.0)

## 2021-06-29 ENCOUNTER — Ambulatory Visit (INDEPENDENT_AMBULATORY_CARE_PROVIDER_SITE_OTHER): Payer: Medicare Other | Admitting: Internal Medicine

## 2021-06-29 ENCOUNTER — Other Ambulatory Visit: Payer: Self-pay

## 2021-06-29 ENCOUNTER — Encounter: Payer: Self-pay | Admitting: Internal Medicine

## 2021-06-29 VITALS — BP 128/84 | HR 91 | Temp 98.2°F | Ht 62.0 in | Wt 314.0 lb

## 2021-06-29 DIAGNOSIS — I10 Essential (primary) hypertension: Secondary | ICD-10-CM

## 2021-06-29 DIAGNOSIS — E1142 Type 2 diabetes mellitus with diabetic polyneuropathy: Secondary | ICD-10-CM | POA: Diagnosis not present

## 2021-06-29 DIAGNOSIS — E1121 Type 2 diabetes mellitus with diabetic nephropathy: Secondary | ICD-10-CM | POA: Diagnosis not present

## 2021-06-29 DIAGNOSIS — E118 Type 2 diabetes mellitus with unspecified complications: Secondary | ICD-10-CM | POA: Diagnosis not present

## 2021-06-29 LAB — BASIC METABOLIC PANEL
BUN: 12 mg/dL (ref 6–23)
CO2: 28 mEq/L (ref 19–32)
Calcium: 10.2 mg/dL (ref 8.4–10.5)
Chloride: 102 mEq/L (ref 96–112)
Creatinine, Ser: 0.94 mg/dL (ref 0.40–1.20)
GFR: 65.25 mL/min (ref >60.00)
Glucose, Bld: 99 mg/dL (ref 70–99)
Potassium: 3.9 mEq/L (ref 3.5–5.1)
Sodium: 138 mEq/L (ref 135–145)

## 2021-06-29 LAB — HEMOGLOBIN A1C: Hgb A1c MFr Bld: 5.3 % (ref 4.6–6.5)

## 2021-06-29 MED ORDER — KERENDIA 10 MG PO TABS: 1.0000 | ORAL_TABLET | Freq: Every day | ORAL | 0 refills | Status: DC

## 2021-06-29 NOTE — Patient Instructions (Signed)
Type 2 Diabetes Mellitus, Diagnosis, Adult ?Type 2 diabetes (type 2 diabetes mellitus) is a long-term, or chronic, disease. In type 2 diabetes, one or both of these problems may be present: ?The pancreas does not make enough of a hormone called insulin. ?Cells in the body do not respond properly to the insulin that the body makes (insulin resistance). ?Normally, insulin allows blood sugar (glucose) to enter cells in the body. The cells use glucose for energy. Insulin resistance or lack of insulin causes excess glucose to build up in the blood instead of going into cells. This causes high blood glucose (hyperglycemia).  ?What are the causes? ?The exact cause of type 2 diabetes is not known. ?What increases the risk? ?The following factors may make you more likely to develop this condition: ?Having a family member with type 2 diabetes. ?Being overweight or obese. ?Being inactive (sedentary). ?Having been diagnosed with insulin resistance. ?Having a history of prediabetes, diabetes when you were pregnant (gestational diabetes), or polycystic ovary syndrome (PCOS). ?What are the signs or symptoms? ?In the early stage of this condition, you may not have symptoms. Symptoms develop slowly and may include: ?Increased thirst or hunger. ?Increased urination. ?Unexplained weight loss. ?Tiredness (fatigue) or weakness. ?Vision changes, such as blurry vision. ?Dark patches on the skin. ?How is this diagnosed? ?This condition is diagnosed based on your symptoms, your medical history, a physical exam, and your blood glucose level. Your blood glucose may be checked with one or more of the following blood tests: ?A fasting blood glucose (FBG) test. You will not be allowed to eat (you will fast) for 8 hours or longer before a blood sample is taken. ?A random blood glucose test. This test checks blood glucose at any time of day regardless of when you ate. ?An A1C (hemoglobin A1C) blood test. This test provides information about blood  glucose levels over the previous 2-3 months. ?An oral glucose tolerance test (OGTT). This test measures your blood glucose at two times: ?After fasting. This is your baseline blood glucose level. ?Two hours after drinking a beverage that contains glucose. ?You may be diagnosed with type 2 diabetes if: ?Your fasting blood glucose level is 126 mg/dL (7.0 mmol/L) or higher. ?Your random blood glucose level is 200 mg/dL (11.1 mmol/L) or higher. ?Your A1C level is 6.5% or higher. ?Your oral glucose tolerance test result is higher than 200 mg/dL (11.1 mmol/L). ?These blood tests may be repeated to confirm your diagnosis. ?How is this treated? ?Your treatment may be managed by a specialist called an endocrinologist. Type 2 diabetes may be treated by following instructions from your health care provider about: ?Making dietary and lifestyle changes. These may include: ?Following a personalized nutrition plan that is developed by a registered dietitian. ?Exercising regularly. ?Finding ways to manage stress. ?Checking your blood glucose level as often as told. ?Taking diabetes medicines or insulin daily. This helps to keep your blood glucose levels in the healthy range. ?Taking medicines to help prevent complications from diabetes. Medicines may include: ?Aspirin. ?Medicine to lower cholesterol. ?Medicine to control blood pressure. ?Your health care provider will set treatment goals for you. Your goals will be based on your age, other medical conditions you have, and how you respond to diabetes treatment. Generally, the goal of treatment is to maintain the following blood glucose levels: ?Before meals: 80-130 mg/dL (4.4-7.2 mmol/L). ?After meals: below 180 mg/dL (10 mmol/L). ?A1C level: less than 7%. ?Follow these instructions at home: ?Questions to ask your health care provider ?  Consider asking the following questions: ?Should I meet with a certified diabetes care and education specialist? ?What diabetes medicines do I need,  and when should I take them? ?What equipment will I need to manage my diabetes at home? ?How often do I need to check my blood glucose? ?Where can I find a support group for people with diabetes? ?What number can I call if I have questions? ?When is my next appointment? ?General instructions ?Take over-the-counter and prescription medicines only as told by your health care provider. ?Keep all follow-up visits. This is important. ?Where to find more information ?For help and guidance and for more information about diabetes, please visit: ?American Diabetes Association (ADA): www.diabetes.org ?American Association of Diabetes Care and Education Specialists (ADCES): www.diabeteseducator.org ?International Diabetes Federation (IDF): www.idf.org ?Contact a health care provider if: ?Your blood glucose is at or above 240 mg/dL (13.3 mmol/L) for 2 days in a row. ?You have been sick or have had a fever for 2 days or longer, and you are not getting better. ?You have any of the following problems for more than 6 hours: ?You cannot eat or drink. ?You have nausea and vomiting. ?You have diarrhea. ?Get help right away if: ?You have severe hypoglycemia. This means your blood glucose is lower than 54 mg/dL (3.0 mmol/L). ?You become confused or you have trouble thinking clearly. ?You have difficulty breathing. ?You have moderate or large ketone levels in your urine. ?These symptoms may represent a serious problem that is an emergency. Do not wait to see if the symptoms will go away. Get medical help right away. Call your local emergency services (911 in the [place] Do not drive yourself to the hospital. ?Summary ?Type 2 diabetes mellitus is a long-term, or chronic, disease. In type 2 diabetes, the pancreas does not make enough of a hormone called insulin, or cells in the body do not respond properly to insulin that the body makes. ?This condition is treated by making dietary and lifestyle changes and taking diabetes medicines or  insulin. ?Your health care provider will set treatment goals for you. Your goals will be based on your age, other medical conditions you have, and how you respond to diabetes treatment. ?Keep all follow-up visits. This is important. ?This information is not intended to replace advice given to you by your health care provider. Make sure you discuss any questions you have with your health care provider. ?Document Revised: 07/05/2020 Document Reviewed: 07/05/2020 ?Elsevier Patient Education ? 2022 Elsevier Inc. ? ?

## 2021-06-29 NOTE — Progress Notes (Signed)
Subjective:  Patient ID: Jamie Salazar, female    DOB: 28-Feb-1961  Age: 61 y.o. MRN: [id number]  CC: Hypertension and Diabetes  This visit occurred during the SARS-CoV-2 public health emergency.  Safety protocols were in place, including screening questions prior to the visit, additional usage of staff PPE, and extensive cleaning of exam room while observing appropriate contact time as indicated for disinfecting solutions.    HPI LANYAH SPENGLER presents for f/up -   She denies CP, DOE, SOB, edema.  Outpatient Medications Prior to Visit  Medication Sig Dispense Refill   allopurinol (ZYLOPRIM) 100 MG tablet TAKE 1 TABLET(100 MG) BY MOUTH DAILY 90 tablet 0   cetirizine (ZYRTEC) 10 MG tablet       colchicine 0.6 MG tablet Take 1.'2mg'$  (2 tablets) then 0.'6mg'$  (1 tablet) 1 hour after. Then, take 1 tablet every day for 7 days. 10 tablet 2   potassium chloride 20 MEQ/15ML (10%) SOLN Take 15 mLs (20 mEq total) by mouth 2 (two) times daily. (Patient taking differently: Take 10 mEq by mouth daily.) 3800 mL 1   rosuvastatin (CRESTOR) 5 MG tablet Take 1 tablet (5 mg total) by mouth daily. 90 tablet 1   tirzepatide (MOUNJARO) 7.5 MG/0.5ML Pen Inject 7.5 mg into the skin once a week. 6 mL 0   torsemide (DEMADEX) 20 MG tablet TAKE 1 TABLET(20 MG) BY MOUTH DAILY (Patient taking differently: 10 mg.) 90 tablet 0   traMADol (ULTRAM) 50 MG tablet Take 1 tablet (50 mg total) by mouth every 6 (six) hours as needed. 90 tablet 5   Finerenone (KERENDIA) 10 MG TABS Take 1 tablet by mouth daily. 30 tablet 0   metoprolol succinate (TOPROL XL) 25 MG 24 hr tablet Take 1 tablet (25 mg total) by mouth every morning. Hold if systolic blood pressure (top number) less than 100 mmHg or pulse less than 60 bpm. 90 tablet 0   No facility-administered medications prior to visit.    ROS Review of Systems  Constitutional:  Negative for diaphoresis and fatigue.  HENT: Negative.    Eyes: Negative.   Respiratory: Negative.   Negative for cough, shortness of breath and wheezing.   Cardiovascular:  Negative for chest pain, palpitations and leg swelling.  Gastrointestinal:  Negative for abdominal pain, constipation, diarrhea and nausea.  Genitourinary: Negative.   Musculoskeletal: Negative.   Skin: Negative.   Neurological:  Negative for dizziness, light-headedness and numbness.  Hematological:  Negative for adenopathy. Does not bruise/bleed easily.  Psychiatric/Behavioral: Negative.     Objective:  BP 128/84 (BP Location: Left Arm, Patient Position: Sitting, Cuff Size: Large)    Pulse 91    Temp 98.2 F (36.8 C) (Oral)    Ht '5\' 2"'$  (1.575 m)    Wt (!) 314 lb (142.4 kg)    SpO2 99%    BMI 57.43 kg/m   BP Readings from Last 3 Encounters:  06/29/21 128/84  06/02/21 (!) 151/85  06/01/21 (!) 140/98    Wt Readings from Last 3 Encounters:  06/29/21 (!) 314 lb (142.4 kg)  06/02/21 (!) 322 lb 6.4 oz (146.2 kg)  06/01/21 (!) 322 lb (146.1 kg)    Physical Exam Vitals reviewed.  Constitutional:      Appearance: She is obese.  HENT:     Nose: Nose normal.     Mouth/Throat:     Mouth: Mucous membranes are moist.  Eyes:     General: No scleral icterus.    Conjunctiva/sclera: Conjunctivae normal.  Cardiovascular:     Rate and Rhythm: Normal rate and regular rhythm.     Heart sounds: No murmur heard.   No gallop.  Pulmonary:     Effort: Pulmonary effort is normal.     Breath sounds: No stridor. No wheezing, rhonchi or rales.  Abdominal:     General: Abdomen is flat.     Palpations: There is no mass.     Tenderness: There is no abdominal tenderness. There is no guarding.     Hernia: No hernia is present.  Musculoskeletal:        General: Normal range of motion.     Cervical back: Neck supple.     Right lower leg: No edema.     Left lower leg: No edema.  Lymphadenopathy:     Cervical: No cervical adenopathy.  Skin:    General: Skin is warm and dry.  Neurological:     General: No focal deficit  present.     Mental Status: She is alert.    Lab Results  Component Value Date   WBC 9.1 03/31/2021   HGB 12.7 03/31/2021   HCT 40.1 03/31/2021   PLT 253.0 03/31/2021   GLUCOSE 99 06/29/2021   CHOL 180 02/17/2021   TRIG 90.0 02/17/2021   HDL 70.90 02/17/2021   LDLCALC 91 02/17/2021   ALT 16 06/01/2021   AST 19 06/01/2021   NA 138 06/29/2021   K 3.9 06/29/2021   CL 102 06/29/2021   CREATININE 0.94 06/29/2021   BUN 12 06/29/2021   CO2 28 06/29/2021   TSH 2.25 02/17/2021   HGBA1C 5.3 06/29/2021   MICROALBUR 6.8 (H) 06/01/2021    US BREAST LTD UNI LEFT INC AXILLA  Result Date: 05/10/2021 CLINICAL DATA:  Screening recall for a possible left breast mass. EXAM: DIGITAL DIAGNOSTIC UNILATERAL LEFT MAMMOGRAM WITH TOMOSYNTHESIS AND CAD; ULTRASOUND LEFT BREAST LIMITED TECHNIQUE: Left digital diagnostic mammography and breast tomosynthesis was performed. The images were evaluated with computer-aided detection.; Targeted ultrasound examination of the left breast was performed. COMPARISON:  Previous exam(s). ACR Breast Density Category a: The breast tissue is almost entirely fatty. FINDINGS: The possible mass noted in the medial left breast persists on the spot compression diagnostic images. It appears as a small oval circumscribed mass measuring 5 mm, projecting at approximately 9 o'clock. Targeted ultrasound is performed, showing a small cyst, with a few thin internal septations, but no other complicating features, at 9:30 o'clock, 7 cm from the nipple, middle depth, measuring 5 x 4 x 4 mm, consistent in size, shape and location to the mammographic mass. There are no solid masses or suspicious lesions. IMPRESSION: 1. No evidence of breast malignancy. 2. Benign left breast cyst. RECOMMENDATION: Screening mammogram in one year.(Code:SM-B-01Y) I have discussed the findings and recommendations with the patient. If applicable, a reminder letter will be sent to the patient regarding the next appointment.  BI-RADS CATEGORY  2: Benign. Electronically Signed   By: Lajean Manes M.D.   On: 05/10/2021 13:44  MM DIAG BREAST TOMO UNI LEFT  Result Date: 05/10/2021 CLINICAL DATA:  Screening recall for a possible left breast mass. EXAM: DIGITAL DIAGNOSTIC UNILATERAL LEFT MAMMOGRAM WITH TOMOSYNTHESIS AND CAD; ULTRASOUND LEFT BREAST LIMITED TECHNIQUE: Left digital diagnostic mammography and breast tomosynthesis was performed. The images were evaluated with computer-aided detection.; Targeted ultrasound examination of the left breast was performed. COMPARISON:  Previous exam(s). ACR Breast Density Category a: The breast tissue is almost entirely fatty. FINDINGS: The possible mass noted in the  medial left breast persists on the spot compression diagnostic images. It appears as a small oval circumscribed mass measuring 5 mm, projecting at approximately 9 o'clock. Targeted ultrasound is performed, showing a small cyst, with a few thin internal septations, but no other complicating features, at 9:30 o'clock, 7 cm from the nipple, middle depth, measuring 5 x 4 x 4 mm, consistent in size, shape and location to the mammographic mass. There are no solid masses or suspicious lesions. IMPRESSION: 1. No evidence of breast malignancy. 2. Benign left breast cyst. RECOMMENDATION: Screening mammogram in one year.(Code:SM-B-01Y) I have discussed the findings and recommendations with the patient. If applicable, a reminder letter will be sent to the patient regarding the next appointment. BI-RADS CATEGORY  2: Benign. Electronically Signed   By: Lajean Manes M.D.   On: 05/10/2021 13:44    Assessment & Plan:   Lucette was seen today for hypertension and diabetes.  Diagnoses and all orders for this visit:  Essential hypertension- Her BP is well controlled. -     Basic metabolic panel; Future -     Basic metabolic panel  Diabetic polyneuropathy associated with type 2 diabetes mellitus (HCC)  Type II diabetes mellitus with  manifestations ([place])- Blood sugar is well controlled. -     Finerenone (KERENDIA) 10 MG TABS; Take 1 tablet by mouth daily. -     Basic metabolic panel; Future -     Hemoglobin A1c; Future -     Hemoglobin A1c -     Basic metabolic panel  Diabetic nephropathy associated with type 2 diabetes mellitus ([place])- Will start an MRA for renal protection. -     Finerenone (KERENDIA) 10 MG TABS; Take 1 tablet by mouth daily.   I am having Alayia L. Edison Pace "Vivien Rota" maintain her cetirizine, rosuvastatin, potassium chloride, metoprolol succinate, colchicine, allopurinol, torsemide, tirzepatide, traMADol, and [place].  Meds ordered this encounter  Medications   Finerenone (KERENDIA) 10 MG TABS    Sig: Take 1 tablet by mouth daily.    Dispense:  30 tablet    Refill:  0     Follow-up: Return in about 6 months (around 12/30/2021).  Scarlette Calico, MD

## 2021-07-03 ENCOUNTER — Other Ambulatory Visit: Payer: Self-pay | Admitting: Cardiology

## 2021-07-03 DIAGNOSIS — R000 Tachycardia, unspecified: Secondary | ICD-10-CM

## 2021-07-19 LAB — HM DIABETES EYE EXAM

## 2021-07-23 ENCOUNTER — Other Ambulatory Visit: Payer: Self-pay | Admitting: Internal Medicine

## 2021-07-23 DIAGNOSIS — E1121 Type 2 diabetes mellitus with diabetic nephropathy: Secondary | ICD-10-CM

## 2021-07-23 DIAGNOSIS — E785 Hyperlipidemia, unspecified: Secondary | ICD-10-CM

## 2021-07-23 DIAGNOSIS — E118 Type 2 diabetes mellitus with unspecified complications: Secondary | ICD-10-CM

## 2021-07-24 ENCOUNTER — Other Ambulatory Visit: Payer: Self-pay | Admitting: Internal Medicine

## 2021-07-24 DIAGNOSIS — E785 Hyperlipidemia, unspecified: Secondary | ICD-10-CM

## 2021-07-24 DIAGNOSIS — E1121 Type 2 diabetes mellitus with diabetic nephropathy: Secondary | ICD-10-CM

## 2021-07-24 DIAGNOSIS — E118 Type 2 diabetes mellitus with unspecified complications: Secondary | ICD-10-CM

## 2021-07-24 MED ORDER — KERENDIA 20 MG PO TABS: 1.0000 | ORAL_TABLET | Freq: Every day | ORAL | 1 refills | Status: DC

## 2021-07-24 MED ORDER — ROSUVASTATIN CALCIUM 5 MG PO TABS: 5.0000 mg | ORAL_TABLET | Freq: Every day | ORAL | 1 refills | Status: DC

## 2021-08-04 ENCOUNTER — Encounter: Payer: Self-pay | Admitting: Internal Medicine

## 2021-08-24 ENCOUNTER — Other Ambulatory Visit: Payer: Self-pay | Admitting: Internal Medicine

## 2021-08-24 DIAGNOSIS — E118 Type 2 diabetes mellitus with unspecified complications: Secondary | ICD-10-CM

## 2021-09-22 ENCOUNTER — Encounter: Payer: Self-pay | Admitting: Allergy

## 2021-09-22 ENCOUNTER — Ambulatory Visit (INDEPENDENT_AMBULATORY_CARE_PROVIDER_SITE_OTHER): Payer: Medicare Other | Admitting: Allergy

## 2021-09-22 VITALS — BP 126/78 | HR 91 | Temp 98.4°F | Resp 18 | Ht 62.0 in | Wt 303.4 lb

## 2021-09-22 DIAGNOSIS — J3089 Other allergic rhinitis: Secondary | ICD-10-CM | POA: Diagnosis not present

## 2021-09-22 DIAGNOSIS — K9049 Malabsorption due to intolerance, not elsewhere classified: Secondary | ICD-10-CM | POA: Diagnosis not present

## 2021-09-22 DIAGNOSIS — H1013 Acute atopic conjunctivitis, bilateral: Secondary | ICD-10-CM

## 2021-09-22 MED ORDER — OLOPATADINE HCL 0.2 % OP SOLN
OPHTHALMIC | 2 refills | Status: DC
Start: 2021-09-22 — End: 2022-01-04

## 2021-09-22 MED ORDER — LEVOCETIRIZINE DIHYDROCHLORIDE 5 MG PO TABS: 5.0000 mg | ORAL_TABLET | Freq: Every evening | ORAL | 2 refills | Status: DC

## 2021-09-22 MED ORDER — IPRATROPIUM BROMIDE 0.06 % NA SOLN
NASAL | 5 refills | Status: DC
Start: 2021-09-22 — End: 2021-10-12

## 2021-09-22 NOTE — Patient Instructions (Addendum)
Allergies - Testing today showed: grasses, trees, indoor molds, outdoor molds, dust mites, and cockroach. - Copy of test results provided.  - Avoidance measures provided. - Stop taking: cold and flu medications - Start taking: Xyzal (levocetirizine) '5mg'$  tablet once daily.  This is a long-acting antihistamine to replace her COVID-vaccine medication.  This is an antihistamine different than Zyrtec. Atrovent (ipratropium) 0.06% 2 sprays per nostril 3-4 times daily as needed for nasal drainage/runny nose or nasal congestion (CAN BE OVER DRYING). Pataday (olopatadine) one drop per eye daily as needed for watery/itchy eyes. - You can use an extra dose of the antihistamine, if needed, for breakthrough symptoms.  - Consider allergy shots as a means of long-term control. - Allergy shots "re-train" and "reset" the immune system to ignore environmental allergens and decrease the resulting immune response to those allergens (sneezing, itchy watery eyes, runny nose, nasal congestion, etc).    - Allergy shots improve symptoms in 80-85% of patients.  - We can discuss more at future appointment if the medications are not working for you.  Food intolerance - Continue avoidance of dairy products.  - skin testing to milk is negative thus you do not have a food allergy to milk but intolerance Intolerance: this is when you have negative testing by either skin testing or blood testing thus not allergic but the food causes symptoms (like belly pain, bloating, diarrhea etc) with ingestion.  These foods should be avoided to prevent symptoms.    Follow-up in 3-4 months or sooner if needed

## 2021-09-22 NOTE — Progress Notes (Signed)
New Patient Note  RE: Jamie Salazar MRN: [id number] DOB: 02/05/1961 Date of Office Visit: 09/22/2021  Primary care provider: Janith Lima, MD  Chief Complaint: allergies  History of present illness: Jamie Salazar is a 61 y.o. female presenting today for evaluation of allergic rhinitis.    She reports having itchy eyes, watery eyes, ear pressure, nasal congestion and drainage, frontal headache, sneezing, throat clearing with cough.  She feels like symptoms have worsened.  Symptoms are year-round.  She takes cold/flu medication for these symptoms and states when she stops these medications her symptoms return.   She has tried zyrtec but states it was not effective.  She has not tried xyzal or Human resources officer.  She has not tried any eye drops.  She has not used any nose sprays but state she has flonase at home.  She has not used it before though.    No history of asthma or eczema.  With dairy she reports developing constipation and this has been worsening in her adult years. She thus avoids dairy products.    Review of systems: Review of Systems  Constitutional: Negative.   HENT:         See HPI  Eyes:        See HPI  Respiratory: Negative.    Cardiovascular: Negative.   Gastrointestinal: Negative.   Musculoskeletal: Negative.   Skin: Negative.   Allergic/Immunologic: Negative.   Neurological: Negative.    All other systems negative unless noted above in HPI  Past medical history: Past Medical History:  Diagnosis Date   Allergy    Zyrtec daily.   Anxiety    Arthritis    disability   Depression    Diabetes mellitus without complication ([place])    Hyperlipidemia    Hypertension    Obesity, morbid ([place])     Past surgical history: Past Surgical History:  Procedure Laterality Date   CHOLECYSTECTOMY     gallbladder      Family history:  Family History  Problem Relation Age of Onset   Lupus Mother    Diabetes Mother    Hypertension Mother    Heart disease  Mother        stents placed 44.   Neuropathy Mother    Breast cancer Neg Hx     Social history: Lives in an apartment with carpeting with electric heating and central cooling.  No pets in the home.  There is no concern for water damage, mildew or roaches in the home.  She does not work.  She has no smoke exposure or history.   Medication List: Current Outpatient Medications  Medication Sig Dispense Refill   Finerenone (KERENDIA) 20 MG TABS Take 1 tablet by mouth daily. 90 tablet 1   ipratropium (ATROVENT) 0.06 % nasal spray Place 2 sprays per nostril 3-4 times daily as needed for nasal drainage/runny nose or nasal congestion 15 mL 5   levocetirizine (XYZAL) 5 MG tablet Take 1 tablet (5 mg total) by mouth every evening. 30 tablet 2   metoprolol succinate (TOPROL-XL) 25 MG 24 hr tablet TAKE 1 TABLET BY MOUTH EVERY MORNING HOLD IF SYSTOLIC BLOOD PRESSURE(TOP NUMBER) LESS THAN 100 MMHG OR PULSE LESS THAN 60 BPM 90 tablet 0   MOUNJARO 7.5 MG/0.5ML Pen ADMINISTER 0.5 ML UNDER THE SKIN 1 TIME A WEEK 6 mL 0   Olopatadine HCl 0.2 % SOLN Apply 1 drop per eye daily as needed for watery/itchy eyes. 2.5 mL 2   rosuvastatin (  CRESTOR) 5 MG tablet Take 1 tablet (5 mg total) by mouth daily. 90 tablet 1   allopurinol (ZYLOPRIM) 100 MG tablet TAKE 1 TABLET(100 MG) BY MOUTH DAILY (Patient not taking: Reported on 09/22/2021) 90 tablet 0   colchicine 0.6 MG tablet Take 1.'2mg'$  (2 tablets) then 0.'6mg'$  (1 tablet) 1 hour after. Then, take 1 tablet every day for 7 days. (Patient not taking: Reported on 09/22/2021) 10 tablet 2   potassium chloride 20 MEQ/15ML (10%) SOLN Take 15 mLs (20 mEq total) by mouth 2 (two) times daily. (Patient not taking: Reported on 09/22/2021) 3800 mL 1   torsemide (DEMADEX) 20 MG tablet TAKE 1 TABLET(20 MG) BY MOUTH DAILY (Patient not taking: Reported on 09/22/2021) 90 tablet 0   traMADol (ULTRAM) 50 MG tablet Take 1 tablet (50 mg total) by mouth every 6 (six) hours as needed. (Patient not taking:  Reported on 09/22/2021) 90 tablet 5   No current facility-administered medications for this visit.    Known medication allergies: Allergies  Allergen Reactions   Lisinopril Cough   Ciprofloxacin Hcl     Hallucination      Physical examination: Blood pressure 126/78, pulse 91, temperature 98.4 F (36.9 C), resp. rate 18, height '5\' 2"'$  (1.575 m), weight (!) 303 lb 6 oz (137.6 kg), SpO2 98 %.  General: Alert, interactive, in no acute distress. HEENT: PERRLA, TMs pearly gray, turbinates moderately edematous with clear discharge, post-pharynx non erythematous. Neck: Supple without lymphadenopathy. Lungs: Clear to auscultation without wheezing, rhonchi or rales. {no increased work of breathing. CV: Normal S1, S2 without murmurs. Abdomen: Nondistended, nontender. Skin: Warm and dry, without lesions or rashes. Extremities:  No clubbing, cyanosis or edema. Neuro:   Grossly intact.  Diagnositics/Labs:  Allergy testing:   Airborne Adult Perc - 09/22/21 1000     Time Antigen Placed 1015    Allergen Manufacturer Lavella Hammock    Location Back    Number of Test 59    1. Control-Buffer 50% Glycerol Negative    2. Control-Histamine 1 mg/ml 2+    3. Albumin saline Negative    4. [place] 2+    5. [place] Negative    6. Johnson Negative    7. [place] Blue Negative    8. Meadow Fescue Negative    9. Perennial Rye Negative    10. Sweet Vernal Negative    11. Timothy Negative    12. Cocklebur Negative    13. Burweed Marshelder Negative    14. Ragweed, short Negative    15. Ragweed, Giant Negative    16. Plantain,  English Negative    17. Lamb's Quarters Negative    18. Sheep Sorrell Negative    19. Rough Pigweed Negative    20. Marsh Elder, Rough Negative    21. Mugwort, Common Negative    22. Ash mix Negative    23. Birch mix Negative    24. Beech American Negative    25. Box, Elder Negative    26. Cedar, red Negative    27. Cottonwood, [place] Negative    28. Elm mix Negative    29.  Hickory Negative    30. Maple mix Negative    31. Oak, [place] mix Negative    32. Pecan Pollen Negative    33. Pine mix Negative    34. Sycamore Eastern Negative    35. [place], Black Pollen Negative    36. Alternaria alternata Negative    37. Cladosporium Herbarum Negative    38. Aspergillus mix Negative  39. Penicillium mix Negative    40. Bipolaris sorokiniana (Helminthosporium) Negative    41. Drechslera spicifera (Curvularia) Negative    42. Mucor plumbeus Negative    43. Fusarium moniliforme Negative    44. Aureobasidium pullulans (pullulara) Negative    45. Rhizopus oryzae Negative    46. Botrytis cinera Negative    48. Phoma betae Negative    49. Candida Albicans Negative    50. Trichophyton mentagrophytes Negative    51. Mite, D Farinae  5,000 AU/ml 3+    52. Mite, D Pteronyssinus  5,000 AU/ml 2+    53. Cat Hair 10,000 BAU/ml Negative    54.  Dog Epithelia Negative    55. Mixed Feathers Negative    56. Horse Epithelia Negative    57. Cockroach, German Negative    58. Mouse Negative    59. Tobacco Leaf Negative             Food Perc - 09/22/21 1100       Food   5. Milk, cow Negative             Intradermal - 09/22/21 1000     Time Antigen Placed 1059    Allergen Manufacturer Lavella Hammock    Location Back    Number of Test 12    Control Negative    7 Grass Negative    Ragweed mix Negative    Weed mix Negative    Tree mix Negative    Mold 1 2+    Mold 2 Negative    Mold 3 Negative    Mold 4 2+    Cat Negative    Dog Negative    Cockroach 2+             Allergy testing results were read and interpreted by provider, documented by clinical staff.   Assessment and plan: Allergic rhinitis with conjunctivitis - Testing today showed: grasses, trees, indoor molds, outdoor molds, dust mites, and cockroach. - Copy of test results provided.  - Avoidance measures provided. - Stop taking: cold and flu medications - Start taking: Xyzal  (levocetirizine) '5mg'$  tablet once daily.  This is a long-acting antihistamine to replace her COVID-vaccine medication.  This is an antihistamine different than Zyrtec. Atrovent (ipratropium) 0.06% 2 sprays per nostril 3-4 times daily as needed for nasal drainage/runny nose or nasal congestion (CAN BE OVER DRYING). Pataday (olopatadine) one drop per eye daily as needed for watery/itchy eyes. - You can use an extra dose of the antihistamine, if needed, for breakthrough symptoms.  - Consider allergy shots as a means of long-term control. - Allergy shots "re-train" and "reset" the immune system to ignore environmental allergens and decrease the resulting immune response to those allergens (sneezing, itchy watery eyes, runny nose, nasal congestion, etc).    - Allergy shots improve symptoms in 80-85% of patients.  - We can discuss more at future appointment if the medications are not working for you.  Food intolerance - Continue avoidance of dairy products.  - skin testing to milk is negative thus you do not have a food allergy to milk but intolerance Intolerance: this is when you have negative testing by either skin testing or blood testing thus not allergic but the food causes symptoms (like belly pain, bloating, diarrhea etc) with ingestion.  These foods should be avoided to prevent symptoms.    Follow-up in 3-4 months or sooner if needed  I appreciate the opportunity to take part in Kieu's care. Please do not  hesitate to contact me with questions.  Sincerely,   Prudy Feeler, MD Allergy/Immunology [facility]

## 2021-09-24 ENCOUNTER — Other Ambulatory Visit: Payer: Self-pay | Admitting: Cardiology

## 2021-09-24 DIAGNOSIS — R000 Tachycardia, unspecified: Secondary | ICD-10-CM

## 2021-10-12 ENCOUNTER — Ambulatory Visit: Payer: Medicare Other | Admitting: Cardiology

## 2021-10-12 ENCOUNTER — Encounter: Payer: Self-pay | Admitting: Cardiology

## 2021-10-12 ENCOUNTER — Encounter: Payer: Self-pay | Admitting: Podiatry

## 2021-10-12 ENCOUNTER — Ambulatory Visit (INDEPENDENT_AMBULATORY_CARE_PROVIDER_SITE_OTHER): Payer: Medicare Other | Admitting: Podiatry

## 2021-10-12 VITALS — BP 137/84 | HR 83 | Temp 97.2°F | Resp 16 | Ht 62.0 in | Wt 301.0 lb

## 2021-10-12 DIAGNOSIS — E119 Type 2 diabetes mellitus without complications: Secondary | ICD-10-CM

## 2021-10-12 DIAGNOSIS — E1142 Type 2 diabetes mellitus with diabetic polyneuropathy: Secondary | ICD-10-CM

## 2021-10-12 DIAGNOSIS — M79675 Pain in left toe(s): Secondary | ICD-10-CM

## 2021-10-12 DIAGNOSIS — B351 Tinea unguium: Secondary | ICD-10-CM | POA: Diagnosis not present

## 2021-10-12 DIAGNOSIS — R0602 Shortness of breath: Secondary | ICD-10-CM

## 2021-10-12 DIAGNOSIS — E1169 Type 2 diabetes mellitus with other specified complication: Secondary | ICD-10-CM

## 2021-10-12 DIAGNOSIS — M79674 Pain in right toe(s): Secondary | ICD-10-CM

## 2021-10-12 NOTE — Progress Notes (Signed)
Date:  10/12/2021   ID:  Jamie Salazar, DOB 1960-10-31, MRN [id number]  PCP:  Jamie Lima, MD  Cardiologist:  Jamie Kras, DO, [facility] (established care 03/08/21)  Date: 10/12/21 Last Office Visit: 04/12/2022  Chief Complaint  Patient presents with   Shortness of Breath   Follow-up    6 months    HPI  Jamie Salazar is a 61 y.o. African-American female whose past medical history and cardiovascular risk factors include: NIDDM, HTN, Hyperlipidemia, obesity due to excess calories.   She is referred to the office at the request of Jamie Lima, MD for evaluation of Abnormal EKG.  Patient presents today for 65-monthfollow-up visit.  She is doing well from a cardiovascular standpoint.  Her shortness of breath is improved at least by 80 to 90%.  She is also lost approximately 30 pounds since last office visit due to increasing physical activity and pharmacological therapy.  She denies anginal discomfort or heart failure symptoms.  She plans to join the local [employer] to start exercising regularly and she is extremely motivated with regards to her weight loss journey  At the last office visit I have recommended her to review the coronary calcium report with her PCP Dr. JRonnald Salazar see if the noncardiac findings need to be further investigated.  Patient states that this is still pending and we will follow-up in September when she goes for follow-up.   She is taking the torsemide and potassium supplements on as needed basis.  FUNCTIONAL STATUS: No structured exercise program or daily routine.   ALLERGIES: Allergies  Allergen Reactions   Lisinopril Cough   Ciprofloxacin Hcl     Hallucination     MEDICATION LIST PRIOR TO VISIT: Current Meds  Medication Sig   allopurinol (ZYLOPRIM) 100 MG tablet TAKE 1 TABLET(100 MG) BY MOUTH DAILY   colchicine 0.6 MG tablet Take 1.'2mg'$  (2 tablets) then 0.'6mg'$  (1 tablet) 1 hour after. Then, take 1 tablet every day for 7 days.   Finerenone  (KERENDIA) 20 MG TABS Take 1 tablet by mouth daily.   levocetirizine (XYZAL) 5 MG tablet Take 1 tablet (5 mg total) by mouth every evening.   metoprolol succinate (TOPROL-XL) 25 MG 24 hr tablet TAKE 1 TABLET BY MOUTH EVERY MORNING HOLD IF SYSTOLIC BLOOD PRESSURE(TOP NUMBER) LESS THAN 100 MMHG OR PULSE LESS THAN 60 BPM   MOUNJARO 7.5 MG/0.5ML Pen ADMINISTER 0.5 ML UNDER THE SKIN 1 TIME A WEEK   rosuvastatin (CRESTOR) 5 MG tablet Take 1 tablet (5 mg total) by mouth daily.     PAST MEDICAL HISTORY: Past Medical History:  Diagnosis Date   Allergy    Zyrtec daily.   Anxiety    Arthritis    disability   Depression    Diabetes mellitus without complication ([facility]    Hyperlipidemia    Hypertension    [place]bid [place]     PAST SURGICAL HISTORY: Past Surgical History:  Procedure Laterality Date   CHOLECYSTECTOMY     gallbladder      FAMILY HISTORY: The patient family history includes Diabetes in her mother; Heart disease in her mother; Hypertension in her mother; Lupus in her mother; Neuropathy in her mother.  SOCIAL HISTORY:  The patient  reports that she has never smoked. Her smokeless tobacco use includes snuff. She reports that she does not drink alcohol and does not use drugs.  REVIEW OF SYSTEMS: Review of Systems  Constitutional: Negative for chills and fever.  HENT:  Negative for hoarse voice and nosebleeds.   Eyes:  Negative for discharge, double vision and pain.  Cardiovascular:  Positive for dyspnea on exertion (improved). Negative for chest pain, claudication, leg swelling, near-syncope, orthopnea, palpitations, paroxysmal nocturnal dyspnea and syncope.  Respiratory:  Negative for hemoptysis and shortness of breath.   Musculoskeletal:  Negative for muscle cramps and myalgias.  Gastrointestinal:  Negative for abdominal pain, constipation, diarrhea, hematemesis, hematochezia, melena, nausea and vomiting.  Neurological:  Negative for dizziness and light-headedness.  All  other systems reviewed and are negative.   PHYSICAL EXAM:    10/12/2021   10:28 AM 10/12/2021   10:24 AM 09/22/2021    9:33 AM  Vitals with BMI  Height  '5\' 2"'$  '5\' 2"'$   Weight  301 lbs 303 lbs 6 oz  BMI  85.46 27.03  Systolic 500 938 182  Diastolic 84 99 78  Pulse 83 87 91   CONSTITUTIONAL: Age-appropriate, medically stable, No acute distress.  SKIN: Skin is warm and dry. No rash noted. No cyanosis. No pallor. No jaundice HEAD: Normocephalic and atraumatic.  EYES: No scleral icterus MOUTH/THROAT: Moist oral membranes.  NECK: No JVD present. No thyromegaly noted. No carotid bruits  CHEST Normal respiratory effort. No intercostal retractions  LUNGS: Clear to auscultation bilaterally.  No stridor. No wheezes. No rales.  CARDIOVASCULAR: regular, positive S1-S2, no murmurs rubs or gallops appreciated. ABDOMINAL: Obese, soft, nontender, nondistended, positive bowel sounds in all 4 quadrants, no apparent ascites.  EXTREMITIES: No peripheral edema, warm to touch,  HEMATOLOGIC: No significant bruising NEUROLOGIC: Oriented to person, place, and time. Nonfocal. Normal muscle tone.  PSYCHIATRIC: Normal mood and affect. Normal behavior. Cooperative  CARDIAC DATABASE: EKG: 10/12/2021: NSR, 88 bpm, poor R wave progression, without underlying injury pattern.    Echocardiogram: 05/16/2021: Left ventricle cavity is normal in size. Mild concentric hypertrophy of the left ventricle. Normal global wall motion. Normal LV systolic function with EF 56%. Indeterminate diastolic filling pattern.  No significant valvular abnormality. Normal right atrial pressure.    Stress Testing: No results found for this or any previous visit from the past 1095 days.  Heart Catheterization: None  Coronary calcium score report 03/31/2021 No visible coronary artery calcifications. Total coronary calcium score of 0.   Left pleural lipoma.   Scattered low-density lesions in the liver, difficult to characterize.  Recommend further evaluation with right upper quadrant ultrasound or contrast-enhanced abdominal CT.  14 day extended Holter monitor: Dominant rhythm normal sinus. Heart rate 56-169 bpm. Avg HR 84 bpm. No atrial fibrillation, ventricular tachycardia, high grade AV block, pauses (3 seconds or longer). Total ventricular ectopic burden <1%. Total supraventricular ectopic burden <1%. Rare episodes of supraventricular tachycardia.  Patient triggered events: 0.   LABORATORY DATA:    Latest Ref Rng & Units 03/31/2021   10:49 AM 02/17/2021    2:14 PM 02/02/2017    3:27 PM  CBC  WBC 4.0 - 10.5 K/uL 9.1  10.6  11.6   Hemoglobin 12.0 - 15.0 g/dL 12.7  13.0  13.2   Hematocrit 36.0 - 46.0 % 40.1  40.3  40.6   Platelets 150.0 - 400.0 K/uL 253.0  245.0  321        Latest Ref Rng & Units 06/29/2021   11:19 AM 06/02/2021    8:46 AM 06/01/2021   12:02 PM  CMP  Glucose 70 - 99 mg/dL 99     BUN 6 - 23 mg/dL 12     Creatinine 0.40 - 1.20 mg/dL 0.94  Sodium 135 - 145 mEq/L 138     Potassium 3.5 - 5.1 mEq/L 3.9     Chloride 96 - 112 mEq/L 102     CO2 19 - 32 mEq/L 28     Calcium 8.4 - 10.5 mg/dL 10.2     Total Protein 6.0 - 8.5 g/dL  8.0  8.5   Total Bilirubin 0.2 - 1.2 mg/dL   0.5   Alkaline Phos 39 - 117 U/L   102   AST 0 - 37 U/L   19   ALT 0 - 35 U/L   16     Lipid Panel     Component Value Date/Time   CHOL 180 02/17/2021 1414   TRIG 90.0 02/17/2021 1414   HDL 70.90 02/17/2021 1414   CHOLHDL 3 02/17/2021 1414   VLDL 18.0 02/17/2021 1414   LDLCALC 91 02/17/2021 1414    No components found for: "NTPROBNP" Recent Labs    02/17/21 1414  PROBNP 18.0   Recent Labs    02/17/21 1414  TSH 2.25    BMP Recent Labs    03/21/21 1326 03/31/21 1144 06/29/21 1119  NA 141 139 138  K 4.2 4.0 3.9  CL 101 100 102  CO2 '21 22 28  '$ GLUCOSE 122* 94 99  BUN '13 12 12  '$ CREATININE 1.25* 1.11* 0.94  CALCIUM 9.8 10.2 10.2    HEMOGLOBIN A1C Lab Results  Component Value Date   HGBA1C  5.3 06/29/2021   MPG 148 02/02/2017    IMPRESSION:    ICD-10-CM   1. Shortness of breath  R06.02 EKG 12-Lead    2. Non-insulin dependent type 2 diabetes mellitus ([place])  E11.9     3. Type 2 diabetes mellitus with hyperlipidemia ([facility])  E11.69    E78.5     4. Class 3 severe obesity due to excess calories with serious comorbidity and body mass index (BMI) of 50.0 to 59.9 in adult [facility])  E66.01    Z68.43        RECOMMENDATIONS: SHYLYNN BRUNING is a 61 y.o. African-American female whose past medical history and cardiac risk factors include: NIDDM, HTN, Hyperlipidemia, obesity due to excess calories.   Patient was referred to the practice for abnormal EKG and shortness of breath.  She is undergone appropriate cardiac work-up.  Since last office visit she had an echocardiogram which notes preserved LVEF, normal diastolic function, no significant valvular heart disease.  She has lost approximately 30 pounds since the last office visit secondary to increasing physical activity as well as pharmacological therapy.  She now takes torsemide and potassium on an as-needed basis.  No additional cardiovascular testing warranted at this time.  She is educated the importance of improving her modifiable cardiovascular risk factors.  She is encouraged to continue her weight loss journey and plans to join the [employer].  I have encouraged her to discuss the calcium report especially the noncardiac findings with her PCP at her next office visit in September 2023.  To see if any additional work-up is warranted.  We will defer this to her PCP at this time.  I like to see her back in 1 year or sooner if change in clinical status.  FINAL MEDICATION LIST END OF ENCOUNTER: No orders of the defined types were placed in this encounter.    Medications Discontinued During This Encounter  Medication Reason   ipratropium (ATROVENT) 0.06 % nasal spray    potassium chloride 20 MEQ/15ML (10%) SOLN  traMADol  (ULTRAM) 50 MG tablet      Current Outpatient Medications:    allopurinol (ZYLOPRIM) 100 MG tablet, TAKE 1 TABLET(100 MG) BY MOUTH DAILY, Disp: 90 tablet, Rfl: 0   colchicine 0.6 MG tablet, Take 1.'2mg'$  (2 tablets) then 0.'6mg'$  (1 tablet) 1 hour after. Then, take 1 tablet every day for 7 days., Disp: 10 tablet, Rfl: 2   Finerenone (KERENDIA) 20 MG TABS, Take 1 tablet by mouth daily., Disp: 90 tablet, Rfl: 1   levocetirizine (XYZAL) 5 MG tablet, Take 1 tablet (5 mg total) by mouth every evening., Disp: 30 tablet, Rfl: 2   metoprolol succinate (TOPROL-XL) 25 MG 24 hr tablet, TAKE 1 TABLET BY MOUTH EVERY MORNING HOLD IF SYSTOLIC BLOOD PRESSURE(TOP NUMBER) LESS THAN 100 MMHG OR PULSE LESS THAN 60 BPM, Disp: 90 tablet, Rfl: 0   MOUNJARO 7.5 MG/0.5ML Pen, ADMINISTER 0.5 ML UNDER THE SKIN 1 TIME A WEEK, Disp: 6 mL, Rfl: 0   rosuvastatin (CRESTOR) 5 MG tablet, Take 1 tablet (5 mg total) by mouth daily., Disp: 90 tablet, Rfl: 1   Olopatadine HCl 0.2 % SOLN, Apply 1 drop per eye daily as needed for watery/itchy eyes. (Patient not taking: Reported on 10/12/2021), Disp: 2.5 mL, Rfl: 2   torsemide (DEMADEX) 20 MG tablet, TAKE 1 TABLET(20 MG) BY MOUTH DAILY (Patient not taking: Reported on 09/22/2021), Disp: 90 tablet, Rfl: 0  Orders Placed This Encounter  Procedures   EKG 12-Lead   There are no Patient Instructions on file for this visit.   --Continue cardiac medications as reconciled in final medication list. --Return in about 1 year (around 10/13/2022) for Follow up, Dyspnea. Or sooner if needed. --Continue follow-up with your primary care physician regarding the management of your other chronic comorbid conditions.  Patient's questions and concerns were addressed to her satisfaction. She voices understanding of the instructions provided during this encounter.   This note was created using a voice recognition software as a result there may be grammatical errors inadvertently enclosed that do not reflect the  nature of this encounter. Every attempt is made to correct such errors.  Total time spent: 26 minutes.  Jamie Salazar, [place], [facility]  Pager: [phone] Office: [phone]

## 2021-10-12 NOTE — Progress Notes (Signed)
This patient returns to my office for at risk foot care.  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  This patient is unable to cut nails herself since the patient cannot reach her nails.These nails are painful walking and wearing shoes.  This patient presents for at risk foot care today.  General Appearance  Alert, conversant and in no acute stress.  Vascular  Dorsalis pedis and posterior tibial  pulses are palpable  bilaterally.  Capillary return is within normal limits  bilaterally. Temperature is within normal limits  bilaterally.  Neurologic  Senn-Weinstein monofilament wire test within normal limits  bilaterally. Muscle power within normal limits bilaterally.  Nails Thick disfigured discolored nails with subungual debris  from hallux to fifth toes bilaterally. No evidence of bacterial infection or drainage bilaterally.  Orthopedic  No limitations of motion  feet .  No crepitus or effusions noted.  No bony pathology or digital deformities noted.  Skin  normotropic skin with no porokeratosis noted bilaterally.  No signs of infections or ulcers noted.     Onychomycosis  Pain in right toes  Pain in left toes  Consent was obtained for treatment procedures.   Mechanical debridement of nails 1-5  bilaterally performed with a nail nipper.  Filed with dremel without incident.    Return office visit    3 months                  Told patient to return for periodic foot care and evaluation due to potential at risk complications.   Gardiner Barefoot DPM

## 2021-11-04 ENCOUNTER — Telehealth: Payer: Self-pay | Admitting: Internal Medicine

## 2021-11-04 DIAGNOSIS — E1121 Type 2 diabetes mellitus with diabetic nephropathy: Secondary | ICD-10-CM

## 2021-11-04 DIAGNOSIS — E1142 Type 2 diabetes mellitus with diabetic polyneuropathy: Secondary | ICD-10-CM

## 2021-11-04 DIAGNOSIS — E118 Type 2 diabetes mellitus with unspecified complications: Secondary | ICD-10-CM

## 2021-11-04 NOTE — Telephone Encounter (Signed)
Pt request for  authorization for diabetic shoes be sent to [employer].Marland Kitchen

## 2021-11-09 NOTE — Addendum Note (Signed)
Addended by: Hinda Kehr on: 11/09/2021 08:26 AM   Modules accepted: Orders

## 2021-11-09 NOTE — Telephone Encounter (Signed)
Order has been signed and faxed to [facility] per pt request.

## 2021-11-09 NOTE — Telephone Encounter (Signed)
Fax #- [phone]

## 2021-11-09 NOTE — Telephone Encounter (Signed)
DME order for diabetic shoes has been done and given to PCP for signature.

## 2021-12-20 ENCOUNTER — Telehealth: Payer: Self-pay | Admitting: Allergy

## 2021-12-20 NOTE — Telephone Encounter (Signed)
Jamie Salazar called in and states she saw an advertisement for Xyzal D and wanted to know if she would benefit better from that instead?  She stated she wanted to know if this would be better for her before she refills her regular Xyzal.  Please advise.

## 2021-12-20 NOTE — Telephone Encounter (Signed)
Left a detailed message explaining why decongestants aren't necessarily good to take with an antihistamine such as Xyzal for long term.

## 2021-12-23 ENCOUNTER — Other Ambulatory Visit: Payer: Self-pay

## 2021-12-23 ENCOUNTER — Other Ambulatory Visit: Payer: Self-pay | Admitting: Cardiology

## 2021-12-23 DIAGNOSIS — R000 Tachycardia, unspecified: Secondary | ICD-10-CM

## 2021-12-23 MED ORDER — LEVOCETIRIZINE DIHYDROCHLORIDE 5 MG PO TABS: 5.0000 mg | ORAL_TABLET | Freq: Every evening | ORAL | 2 refills | Status: DC

## 2022-01-04 ENCOUNTER — Ambulatory Visit (INDEPENDENT_AMBULATORY_CARE_PROVIDER_SITE_OTHER): Payer: Medicare Other | Admitting: Internal Medicine

## 2022-01-04 ENCOUNTER — Encounter: Payer: Self-pay | Admitting: Internal Medicine

## 2022-01-04 VITALS — BP 138/88 | HR 90 | Temp 98.2°F | Ht 62.0 in | Wt 297.0 lb

## 2022-01-04 DIAGNOSIS — I10 Essential (primary) hypertension: Secondary | ICD-10-CM | POA: Diagnosis not present

## 2022-01-04 DIAGNOSIS — Z23 Encounter for immunization: Secondary | ICD-10-CM | POA: Diagnosis not present

## 2022-01-04 DIAGNOSIS — E118 Type 2 diabetes mellitus with unspecified complications: Secondary | ICD-10-CM | POA: Diagnosis not present

## 2022-01-04 LAB — CBC WITH DIFFERENTIAL/PLATELET
Basophils Absolute: 0 10*3/uL (ref 0.0–0.1)
Basophils Relative: 0.4 % (ref 0.0–3.0)
Eosinophils Absolute: 0.5 10*3/uL (ref 0.0–0.7)
Eosinophils Relative: 6.1 % — ABNORMAL HIGH (ref 0.0–5.0)
HCT: 42.2 % (ref 36.0–46.0)
Hemoglobin: 13.8 g/dL (ref 12.0–15.0)
Lymphocytes Relative: 24.9 % (ref 12.0–46.0)
Lymphs Abs: 2.1 10*3/uL (ref 0.7–4.0)
MCHC: 32.7 g/dL (ref 30.0–36.0)
MCV: 77.4 fl — ABNORMAL LOW (ref 78.0–100.0)
Monocytes Absolute: 0.5 10*3/uL (ref 0.1–1.0)
Monocytes Relative: 6.2 % (ref 3.0–12.0)
Neutro Abs: 5.3 10*3/uL (ref 1.4–7.7)
Neutrophils Relative %: 62.4 % (ref 43.0–77.0)
Platelets: 210 10*3/uL (ref 150.0–400.0)
RBC: 5.45 Mil/uL — ABNORMAL HIGH (ref 3.87–5.11)
RDW: 16.1 % — ABNORMAL HIGH (ref 11.5–15.5)
WBC: 8.5 10*3/uL (ref 4.0–10.5)

## 2022-01-04 LAB — BASIC METABOLIC PANEL
BUN: 19 mg/dL (ref 6–23)
CO2: 26 mEq/L (ref 19–32)
Calcium: 10.1 mg/dL (ref 8.4–10.5)
Chloride: 103 mEq/L (ref 96–112)
Creatinine, Ser: 1.12 mg/dL (ref 0.40–1.20)
GFR: 52.69 mL/min — ABNORMAL LOW (ref >60.00)
Glucose, Bld: 88 mg/dL (ref 70–99)
Potassium: 4.1 mEq/L (ref 3.5–5.1)
Sodium: 137 mEq/L (ref 135–145)

## 2022-01-04 LAB — HEMOGLOBIN A1C: Hgb A1c MFr Bld: 5.6 % (ref 4.6–6.5)

## 2022-01-04 MED ORDER — SHINGRIX 50 MCG/0.5ML IM SUSR: 0.5000 mL | Freq: Once | INTRAMUSCULAR | 1 refills | Status: DC

## 2022-01-04 MED ORDER — TIRZEPATIDE 10 MG/0.5ML ~~LOC~~ SOAJ: 10.0000 mg | SUBCUTANEOUS | 0 refills | Status: DC

## 2022-01-04 NOTE — Patient Instructions (Signed)
                                                                    [email or web address]                       

## 2022-01-04 NOTE — Progress Notes (Signed)
Subjective:  Patient ID: Jamie Salazar, female    DOB: [birth year]  Age: 61 y.o. MRN: [id number]  CC: Hypertension and Diabetes   HPI Jamie Salazar presents for f/up -    She walks about an hour a day and does not experience chest pain, shortness of breath, or edema.  She would like to increase the dose of Mounjaro.  Outpatient Medications Prior to Visit  Medication Sig Dispense Refill   allopurinol (ZYLOPRIM) 100 MG tablet TAKE 1 TABLET(100 MG) BY MOUTH DAILY 90 tablet 0   colchicine 0.6 MG tablet Take 1.'2mg'$  (2 tablets) then 0.'6mg'$  (1 tablet) 1 hour after. Then, take 1 tablet every day for 7 days. 10 tablet 2   Finerenone (KERENDIA) 20 MG TABS Take 1 tablet by mouth daily. 90 tablet 1   levocetirizine (XYZAL) 5 MG tablet Take 1 tablet (5 mg total) by mouth every evening. 30 tablet 2   metoprolol succinate (TOPROL-XL) 25 MG 24 hr tablet TAKE 1 TABLET BY MOUTH EVERY MORNING, HOLD IF SYSTOLIC PRESSURE(TOP NUMBER) LESS THAN 100 OR PULSE LESS THAN 60 90 tablet 0   rosuvastatin (CRESTOR) 5 MG tablet Take 1 tablet (5 mg total) by mouth daily. 90 tablet 1   torsemide (DEMADEX) 20 MG tablet TAKE 1 TABLET(20 MG) BY MOUTH DAILY (Patient not taking: Reported on 09/22/2021) 90 tablet 0   MOUNJARO 7.5 MG/0.5ML Pen ADMINISTER 0.5 ML UNDER THE SKIN 1 TIME A WEEK 6 mL 0   Olopatadine HCl 0.2 % SOLN Apply 1 drop per eye daily as needed for watery/itchy eyes. (Patient not taking: Reported on 10/12/2021) 2.5 mL 2   No facility-administered medications prior to visit.    ROS Review of Systems  Constitutional:  Negative for diaphoresis, fatigue and unexpected weight change.  HENT: Negative.    Eyes: Negative.   Respiratory:  Negative for cough, chest tightness, shortness of breath and wheezing.   Cardiovascular:  Negative for chest pain, palpitations and leg swelling.  Gastrointestinal:  Negative for abdominal pain, constipation, diarrhea, nausea and vomiting.  Endocrine: Negative.    Genitourinary: Negative.  Negative for difficulty urinating.  Musculoskeletal:  Positive for arthralgias. Negative for myalgias.  Skin: Negative.  Negative for color change and pallor.  Allergic/Immunologic: Negative.   Neurological: Negative.  Negative for dizziness.  Hematological:  Negative for adenopathy. Does not bruise/bleed easily.    Objective:  BP 138/88 (BP Location: Left Arm, Patient Position: Sitting, Cuff Size: Large)   Pulse 90   Temp 98.2 F (36.8 C) (Oral)   Ht '5\' 2"'$  (1.575 m)   Wt 297 lb (134.7 kg)   SpO2 99%   BMI 54.32 kg/m   BP Readings from Last 3 Encounters:  01/04/22 138/88  10/12/21 137/84  09/22/21 126/78    Wt Readings from Last 3 Encounters:  01/04/22 297 lb (134.7 kg)  10/12/21 (!) 301 lb (136.5 kg)  09/22/21 (!) 303 lb 6 oz (137.6 kg)    Physical Exam Vitals reviewed.  HENT:     Mouth/Throat:     Mouth: Mucous membranes are moist.  Eyes:     General: No scleral icterus.    Conjunctiva/sclera: Conjunctivae normal.  Cardiovascular:     Rate and Rhythm: Normal rate and regular rhythm.     Heart sounds: No murmur heard. Pulmonary:     Effort: Pulmonary effort is normal.     Breath sounds: No stridor. No wheezing, rhonchi or rales.  Abdominal:     General: Abdomen is protuberant.  Palpations: There is no mass.     Tenderness: There is no abdominal tenderness. There is no guarding.     Hernia: No hernia is present.  Musculoskeletal:        General: Normal range of motion.     Cervical back: Neck supple.     Right lower leg: No edema.     Left lower leg: No edema.  Lymphadenopathy:     Cervical: No cervical adenopathy.  Skin:    General: Skin is warm and dry.  Neurological:     General: No focal deficit present.     Mental Status: She is alert.  Psychiatric:        Mood and Affect: Mood normal.        Behavior: Behavior normal.     Lab Results  Component Value Date   WBC 8.5 01/04/2022   HGB 13.8 01/04/2022   HCT 42.2  01/04/2022   PLT 210.0 01/04/2022   GLUCOSE 88 01/04/2022   CHOL 180 02/17/2021   TRIG 90.0 02/17/2021   HDL 70.90 02/17/2021   LDLCALC 91 02/17/2021   ALT 16 06/01/2021   AST 19 06/01/2021   NA 137 01/04/2022   K 4.1 01/04/2022   CL 103 01/04/2022   CREATININE 1.12 01/04/2022   BUN 19 01/04/2022   CO2 26 01/04/2022   TSH 2.25 02/17/2021   HGBA1C 5.6 01/04/2022   MICROALBUR 6.8 (H) 06/01/2021    MM DIAG BREAST TOMO UNI LEFT  Result Date: 05/10/2021 CLINICAL DATA:  Screening recall for a possible left breast mass. EXAM: DIGITAL DIAGNOSTIC UNILATERAL LEFT MAMMOGRAM WITH TOMOSYNTHESIS AND CAD; ULTRASOUND LEFT BREAST LIMITED TECHNIQUE: Left digital diagnostic mammography and breast tomosynthesis was performed. The images were evaluated with computer-aided detection.; Targeted ultrasound examination of the left breast was performed. COMPARISON:  Previous exam(s). ACR Breast Density Category a: The breast tissue is almost entirely fatty. FINDINGS: The possible mass noted in the medial left breast persists on the spot compression diagnostic images. It appears as a small oval circumscribed mass measuring 5 mm, projecting at approximately 9 o'clock. Targeted ultrasound is performed, showing a small cyst, with a few thin internal septations, but no other complicating features, at 9:30 o'clock, 7 cm from the nipple, middle depth, measuring 5 x 4 x 4 mm, consistent in size, shape and location to the mammographic mass. There are no solid masses or suspicious lesions. IMPRESSION: 1. No evidence of breast malignancy. 2. Benign left breast cyst. RECOMMENDATION: Screening mammogram in one year.(Code:SM-B-01Y) I have discussed the findings and recommendations with the patient. If applicable, a reminder letter will be sent to the patient regarding the next appointment. BI-RADS CATEGORY  2: Benign. Electronically Signed   By: Lajean Manes M.D.   On: 05/10/2021 13:44   US BREAST LTD UNI LEFT INC AXILLA  Result  Date: 05/10/2021 CLINICAL DATA:  Screening recall for a possible left breast mass. EXAM: DIGITAL DIAGNOSTIC UNILATERAL LEFT MAMMOGRAM WITH TOMOSYNTHESIS AND CAD; ULTRASOUND LEFT BREAST LIMITED TECHNIQUE: Left digital diagnostic mammography and breast tomosynthesis was performed. The images were evaluated with computer-aided detection.; Targeted ultrasound examination of the left breast was performed. COMPARISON:  Previous exam(s). ACR Breast Density Category a: The breast tissue is almost entirely fatty. FINDINGS: The possible mass noted in the medial left breast persists on the spot compression diagnostic images. It appears as a small oval circumscribed mass measuring 5 mm, projecting at approximately 9 o'clock. Targeted ultrasound is performed, showing a small cyst, with a few thin  internal septations, but no other complicating features, at 9:30 o'clock, 7 cm from the nipple, middle depth, measuring 5 x 4 x 4 mm, consistent in size, shape and location to the mammographic mass. There are no solid masses or suspicious lesions. IMPRESSION: 1. No evidence of breast malignancy. 2. Benign left breast cyst. RECOMMENDATION: Screening mammogram in one year.(Code:SM-B-01Y) I have discussed the findings and recommendations with the patient. If applicable, a reminder letter will be sent to the patient regarding the next appointment. BI-RADS CATEGORY  2: Benign. Electronically Signed   By: Lajean Manes M.D.   On: 05/10/2021 13:44   Assessment & Plan:   Donia was seen today for hypertension and diabetes.  Diagnoses and all orders for this visit:  Essential hypertension- Her blood pressure is adequately well controlled. -     Basic metabolic panel; Future -     CBC with Differential/Platelet; Future -     CBC with Differential/Platelet -     Basic metabolic panel  Type II diabetes mellitus with manifestations ([place])- Her blood sugar is well controlled. -     tirzepatide (MOUNJARO) 10 MG/0.5ML Pen; Inject 10  mg into the skin once a week. -     Basic metabolic panel; Future -     Hemoglobin A1c; Future -     Hemoglobin A1c -     Basic metabolic panel  Other orders -     Flu Vaccine QUAD 6+ mos PF IM (Fluarix Quad PF) -     Zoster Vaccine Adjuvanted [facility]) injection; Inject 0.5 mLs into the muscle once for 1 dose.   I have discontinued Myana LEdison Pace "Toni"'s Mounjaro and Olopatadine HCl. I am also having her start on Shingrix and tirzepatide. Additionally, I am having her maintain her colchicine, allopurinol, torsemide, Kerendia, rosuvastatin, metoprolol succinate, and levocetirizine.  Meds ordered this encounter  Medications   Zoster Vaccine Adjuvanted [facility]) injection    Sig: Inject 0.5 mLs into the muscle once for 1 dose.    Dispense:  0.5 mL    Refill:  1   tirzepatide (MOUNJARO) 10 MG/0.5ML Pen    Sig: Inject 10 mg into the skin once a week.    Dispense:  6 mL    Refill:  0     Follow-up: Return in about 6 months (around 07/05/2022).  Scarlette Calico, MD

## 2022-01-13 ENCOUNTER — Ambulatory Visit (INDEPENDENT_AMBULATORY_CARE_PROVIDER_SITE_OTHER): Payer: Medicare Other | Admitting: Podiatry

## 2022-01-13 ENCOUNTER — Encounter: Payer: Self-pay | Admitting: Podiatry

## 2022-01-13 DIAGNOSIS — E1142 Type 2 diabetes mellitus with diabetic polyneuropathy: Secondary | ICD-10-CM | POA: Diagnosis not present

## 2022-01-13 DIAGNOSIS — B351 Tinea unguium: Secondary | ICD-10-CM | POA: Diagnosis not present

## 2022-01-13 DIAGNOSIS — M79675 Pain in left toe(s): Secondary | ICD-10-CM | POA: Diagnosis not present

## 2022-01-13 DIAGNOSIS — M79674 Pain in right toe(s): Secondary | ICD-10-CM

## 2022-01-13 NOTE — Progress Notes (Signed)
This patient returns to my office for at risk foot care.  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  This patient is unable to cut nails herself since the patient cannot reach her nails.These nails are painful walking and wearing shoes.  This patient presents for at risk foot care today.  General Appearance  Alert, conversant and in no acute stress.  Vascular  Dorsalis pedis and posterior tibial  pulses are palpable  bilaterally.  Capillary return is within normal limits  bilaterally. Temperature is within normal limits  bilaterally.  Neurologic  Senn-Weinstein monofilament wire test within normal limits  bilaterally. Muscle power within normal limits bilaterally.  Nails Thick disfigured discolored nails with subungual debris  from hallux to fifth toes bilaterally. No evidence of bacterial infection or drainage bilaterally.  Orthopedic  No limitations of motion  feet .  No crepitus or effusions noted.  No bony pathology or digital deformities noted.  Skin  normotropic skin with no porokeratosis noted bilaterally.  No signs of infections or ulcers noted.     Onychomycosis  Pain in right toes  Pain in left toes  Consent was obtained for treatment procedures.   Mechanical debridement of nails 1-5  bilaterally performed with a nail nipper.  Filed with dremel without incident.    Return office visit       4    months                  Told patient to return for periodic foot care and evaluation due to potential at risk complications.   Gardiner Barefoot DPM

## 2022-01-18 ENCOUNTER — Telehealth: Payer: Self-pay | Admitting: Internal Medicine

## 2022-01-18 NOTE — Telephone Encounter (Signed)
Patient would like call back to know if she is eligible for a scooter. Call back number is [phone].

## 2022-01-23 NOTE — Telephone Encounter (Signed)
Pt has been informed and will reach out to a company to get paperwork started for scooter.

## 2022-01-24 ENCOUNTER — Telehealth: Payer: Self-pay

## 2022-01-24 NOTE — Telephone Encounter (Signed)
Pt was advised to call to give let the nurse know to be on the look out for the order for Dr. Ronnald Ramp to sign for Diabetic shoes.  FYI

## 2022-01-25 ENCOUNTER — Encounter: Payer: Self-pay | Admitting: Allergy

## 2022-01-25 ENCOUNTER — Ambulatory Visit (INDEPENDENT_AMBULATORY_CARE_PROVIDER_SITE_OTHER): Payer: Medicare Other | Admitting: Allergy

## 2022-01-25 VITALS — BP 116/76 | HR 80 | Temp 97.8°F

## 2022-01-25 DIAGNOSIS — K9049 Malabsorption due to intolerance, not elsewhere classified: Secondary | ICD-10-CM

## 2022-01-25 DIAGNOSIS — J3089 Other allergic rhinitis: Secondary | ICD-10-CM

## 2022-01-25 DIAGNOSIS — H1013 Acute atopic conjunctivitis, bilateral: Secondary | ICD-10-CM

## 2022-01-25 NOTE — Progress Notes (Signed)
Follow-up Note  RE: Jamie Salazar MRN: [id number] DOB: December 16, 1960 Date of Office Visit: 01/25/2022   History of present illness: Jamie Salazar is a 61 y.o. female presenting today for follow-up of allergic rhinitis with conjunctivitis and food intolerance.  She was last seen in the office on 09/22/21 by myself.   She states her allergy symptoms are still just as bad but continued itchy and watery eyes, ear pressure, nasal congestion and drainage, headache, sneezing, throat clearing and cough..  She did try the Xyzal although she does feel it is helping but does not help enough.  She has not used the nasal spray or eyedrop as she does not like using them.  She lives by herself and would have no one to help assist in putting in eyedrops she does not think she can put an eyedrop in by herself.  Singulair as an add-on option to her Xyzal was discussed however she states she has a history of depression/anxiety already and does not want to potential in the take a medicine that could worsen this.  She did look into allergy shots with her insurance and she does state it is covered. She does continue to avoid dairy products due to her food intolerance.  Review of systems: Review of Systems  Constitutional: Negative.   HENT:         See HPI  Eyes:        See HPI  Respiratory: Negative.    Cardiovascular: Negative.   Gastrointestinal: Negative.   Musculoskeletal: Negative.   Skin: Negative.   Allergic/Immunologic: Negative.   Neurological: Negative.      All other systems negative unless noted above in HPI  Past medical/social/surgical/family history have been reviewed and are unchanged unless specifically indicated below.  No changes  Medication List: Current Outpatient Medications  Medication Sig Dispense Refill   allopurinol (ZYLOPRIM) 100 MG tablet TAKE 1 TABLET(100 MG) BY MOUTH DAILY 90 tablet 0   colchicine 0.6 MG tablet Take 1.'2mg'$  (2 tablets) then 0.'6mg'$  (1 tablet) 1 hour  after. Then, take 1 tablet every day for 7 days. 10 tablet 2   levocetirizine (XYZAL) 5 MG tablet Take 1 tablet (5 mg total) by mouth every evening. 30 tablet 2   metoprolol succinate (TOPROL-XL) 25 MG 24 hr tablet TAKE 1 TABLET BY MOUTH EVERY MORNING, HOLD IF SYSTOLIC PRESSURE(TOP NUMBER) LESS THAN 100 OR PULSE LESS THAN 60 90 tablet 0   tirzepatide (MOUNJARO) 10 MG/0.5ML Pen Inject 10 mg into the skin once a week. 6 mL 0   torsemide (DEMADEX) 20 MG tablet TAKE 1 TABLET(20 MG) BY MOUTH DAILY 90 tablet 0   KERENDIA 20 MG TABS TAKE 1 TABLET BY MOUTH DAILY 90 tablet 0   rosuvastatin (CRESTOR) 5 MG tablet TAKE 1 TABLET(5 MG) BY MOUTH DAILY 90 tablet 0   No current facility-administered medications for this visit.     Known medication allergies: Allergies  Allergen Reactions   Lisinopril Cough   Ciprofloxacin Hcl     Hallucination      Physical examination: Blood pressure 116/76, pulse 80, temperature 97.8 F (36.6 C), temperature source Temporal, SpO2 99 %.  General: Alert, interactive, in no acute distress. HEENT: PERRLA, TMs pearly gray, turbinates moderately edematous without discharge, post-pharynx non erythematous. Neck: Supple without lymphadenopathy. Lungs: Clear to auscultation without wheezing, rhonchi or rales. {no increased work of breathing. CV: Normal S1, S2 without murmurs. Abdomen: Nondistended, nontender. Skin: Warm and dry, without lesions or rashes. Extremities:  No clubbing, cyanosis or edema. Neuro:   Grossly intact.  Diagnositics/Labs: None today  Assessment and plan:   Allergic rhinitis with conjunctivitis - Continue avoidance measures for grasses, trees, indoor molds, outdoor molds, dust mites, and cockroach. - Contiue Xyzal (levocetirizine) '5mg'$  tablet and increase to 2 tabs a day (max dosing of antihistamine like xyzal is 4 tabs a day).  Discussed today we will give additional Xyzal all month try and if she is not noticing a significant improvement in  her allergy symptoms then she will most likely start allergy shots. - Singulair discussed with add-on therapy however I did review the black box warning labeling in regards to Singulair and due to this concern and her history of depression she has declined to try this medication at this time - Will hold off on nasal sprays and eye drops at this time as not able to perform these - Consider allergy shots as a means of long-term control. - Allergy shots "re-train" and "reset" the immune system to ignore environmental allergens and decrease the resulting immune response to those allergens (sneezing, itchy watery eyes, runny nose, nasal congestion, etc).    - Allergy shots improve symptoms in 80-85% of patients.  - If you decide to proceed with allergy shots you can call and schedule a allergy shot new start appointment  Food intolerance - Continue avoidance of dairy products.  - skin testing to milk is negative thus you do not have a food allergy to milk but intolerance Intolerance: this is when you have negative testing by either skin testing or blood testing thus not allergic but the food causes symptoms (like belly pain, bloating, diarrhea etc) with ingestion.  These foods should be avoided to prevent symptoms.    Follow-up in 4-6 months or sooner if needed   I appreciate the opportunity to take part in Cristalle's care. Please do not hesitate to contact me with questions.  Sincerely,   Prudy Feeler, MD Allergy/Immunology [facility] [place] [facility]

## 2022-01-25 NOTE — Patient Instructions (Addendum)
Allergies - Continue avoidance measures for grasses, trees, indoor molds, outdoor molds, dust mites, and cockroach. - Contiue Xyzal (levocetirizine) '5mg'$  tablet and increase to 2 tabs a day (max dosing of antihistamine like xyzal is 4 tabs a day) - Consider allergy shots as a means of long-term control. - Allergy shots "re-train" and "reset" the immune system to ignore environmental allergens and decrease the resulting immune response to those allergens (sneezing, itchy watery eyes, runny nose, nasal congestion, etc).    - Allergy shots improve symptoms in 80-85% of patients.  - If you decide to proceed with allergy shots you can call and schedule a allergy shot new start appointment  Food intolerance - Continue avoidance of dairy products.  - skin testing to milk is negative thus you do not have a food allergy to milk but intolerance Intolerance: this is when you have negative testing by either skin testing or blood testing thus not allergic but the food causes symptoms (like belly pain, bloating, diarrhea etc) with ingestion.  These foods should be avoided to prevent symptoms.    Follow-up in 4-6 months or sooner if needed

## 2022-01-26 ENCOUNTER — Other Ambulatory Visit: Payer: Self-pay | Admitting: Internal Medicine

## 2022-01-26 DIAGNOSIS — E118 Type 2 diabetes mellitus with unspecified complications: Secondary | ICD-10-CM

## 2022-01-26 DIAGNOSIS — E1121 Type 2 diabetes mellitus with diabetic nephropathy: Secondary | ICD-10-CM

## 2022-01-26 DIAGNOSIS — E785 Hyperlipidemia, unspecified: Secondary | ICD-10-CM

## 2022-01-27 NOTE — Telephone Encounter (Signed)
error 

## 2022-01-30 NOTE — Telephone Encounter (Signed)
[facility] - they need an order for the scooter and medical report - please fax to [phone]

## 2022-02-16 ENCOUNTER — Telehealth: Payer: Self-pay | Admitting: Internal Medicine

## 2022-02-16 NOTE — Telephone Encounter (Signed)
Pt called to report Dr. Ronnald Ramp approved pt to get a scooter. Pt says [facility] told pt they  are waiting for Dr. Ronnald Ramp to do what is needed to get the scooter ordered.  Call pt phone if needed [phone].

## 2022-02-16 NOTE — Telephone Encounter (Signed)
I have called [facility] [phone] for clarification on what was needed for scooter.  I was informed that an F2F office note would be needed.  Fax # [phone]  Please advise if you could addend recent OV 01/13/22 note or if you would like to see the pt again.

## 2022-02-27 NOTE — Telephone Encounter (Signed)
Patient checking status forms being given to [facility] for processing the order for the scooter.  Please call pt to advise what needs to be done to fulfill the order for the scooter.  Pt phone [phone]

## 2022-03-02 NOTE — Telephone Encounter (Signed)
I have called [facility] [phone] and requested documentation outline for F2F to be faxed to [facility] fax number. Representative stated the he would send it over to my attention now.

## 2022-03-13 NOTE — Patient Instructions (Signed)

## 2022-03-13 NOTE — Progress Notes (Unsigned)
Subjective:   Jamie Salazar is a 61 y.o. female who presents for an Initial Medicare Annual Wellness Visit. I connected with  [name] on 03/13/22 by a audio enabled telemedicine application and verified that I am speaking with the correct person using two identifiers.  Patient Location: Home  Provider Location: Home Office  I discussed the limitations of evaluation and management by telemedicine. The patient expressed understanding and agreed to proceed.  Review of Systems    Deferred to PCP       Objective:    There were no vitals filed for this visit. There is no height or weight on file to calculate BMI.     01/14/2017   12:48 PM  Advanced Directives  Does Patient Have a Medical Advance Directive? No    Current Medications (verified) Outpatient Encounter Medications as of 03/14/2022  Medication Sig   allopurinol (ZYLOPRIM) 100 MG tablet TAKE 1 TABLET(100 MG) BY MOUTH DAILY   colchicine 0.6 MG tablet Take 1.2mg  (2 tablets) then 0.6mg  (1 tablet) 1 hour after. Then, take 1 tablet every day for 7 days.   KERENDIA 20 MG TABS TAKE 1 TABLET BY MOUTH DAILY   levocetirizine (XYZAL) 5 MG tablet Take 1 tablet (5 mg total) by mouth every evening.   metoprolol succinate (TOPROL-XL) 25 MG 24 hr tablet TAKE 1 TABLET BY MOUTH EVERY MORNING, HOLD IF SYSTOLIC PRESSURE(TOP NUMBER) LESS THAN 100 OR PULSE LESS THAN 60   rosuvastatin (CRESTOR) 5 MG tablet TAKE 1 TABLET(5 MG) BY MOUTH DAILY   tirzepatide (MOUNJARO) 10 MG/0.5ML Pen Inject 10 mg into the skin once a week.   torsemide (DEMADEX) 20 MG tablet TAKE 1 TABLET(20 MG) BY MOUTH DAILY   No facility-administered encounter medications on file as of 03/14/2022.    Allergies (verified) Lisinopril and Ciprofloxacin hcl   History: Past Medical History:  Diagnosis Date   Allergy    Zyrtec daily.   Anxiety    Arthritis    disability   Depression    Diabetes mellitus without complication (HCC)    Hyperlipidemia     Hypertension    Obesity, morbid (HCC)    Past Surgical History:  Procedure Laterality Date   CHOLECYSTECTOMY     gallbladder     Family History  Problem Relation Age of Onset   Lupus Mother    Diabetes Mother    Hypertension Mother    Heart disease Mother        stents placed 21.   Neuropathy Mother    Breast cancer Neg Hx    Social History   Socioeconomic History   Marital status: Single    Spouse name: Not on file   Number of children: 0   Years of education: Not on file   Highest education level: Not on file  Occupational History   Occupation: disability    Comment: OA, depression  Tobacco Use   Smoking status: Never   Smokeless tobacco: Current    Types: Snuff  Vaping Use   Vaping Use: Never used  Substance and Sexual Activity   Alcohol use: No   Drug use: No   Sexual activity: Never  Other Topics Concern   Not on file  Social History Narrative   Marital status: single; not dating and not interested.      Children: none      Lives: alone      Employment: disabled due to OA, depression/anxiety      Tobacco:  Social Determinants of Health   Financial Resource Strain: Not on file  Food Insecurity: Not on file  Transportation Needs: Not on file  Physical Activity: Not on file  Stress: Not on file  Social Connections: Not on file    Tobacco Counseling Ready to quit: Not Answered Counseling given: Not Answered   Clinical Intake:                 Diabetic?Yes Nutrition Risk Assessment:  Has the patient had any N/V/D within the last 2 months?  {YES/NO:21197} Does the patient have any non-healing wounds?  {YES/NO:21197} Has the patient had any unintentional weight loss or weight gain?  {YES/NO:21197}  Diabetes:  Is the patient diabetic?  {YES/NO:21197} If diabetic, was a CBG obtained today?  {YES/NO:21197} Did the patient bring in their glucometer from home?  {YES/NO:21197} How often do you monitor your CBG's? ***.    Financial Strains and Diabetes Management:  Are you having any financial strains with the device, your supplies or your medication? {YES/NO:21197}.  Does the patient want to be seen by Chronic Care Management for management of their diabetes?  {YES/NO:21197} Would the patient like to be referred to a Nutritionist or for Diabetic Management?  {YES/NO:21197}  Diabetic Exams:  Diabetic Eye Exam: Completed 07/19/21 Diabetic Foot Exam: Completed 03/31/21          Activities of Daily Living     No data to display           Patient Care Team: Etta Grandchild, MD as PCP - General (Internal Medicine)  Indicate any recent Medical Services you may have received from other than [facility] providers in the past year (date may be approximate).     Assessment:   This is a routine wellness examination for Jamie Salazar.  Hearing/Vision screen No results found.  Dietary issues and exercise activities discussed:     Goals Addressed   None   Depression Screen    01/04/2022    9:55 AM 06/01/2021   11:15 AM 06/01/2021   11:01 AM 07/05/2016   10:02 AM 10/12/2015    8:48 AM 04/13/2015    1:20 PM 07/24/2014   11:12 AM  PHQ 2/9 Scores  PHQ - 2 Score 0 2 0 0 0 0 0  PHQ- 9 Score 0 4 0        Fall Risk    06/01/2021   11:01 AM 10/12/2015    8:48 AM 04/13/2015    1:20 PM 07/24/2014   11:12 AM  Fall Risk   Falls in the past year? 0 No No No  Number falls in past yr: 0     Injury with Fall? 0     Risk for fall due to : No Fall Risks     Follow up Falls evaluation completed       FALL RISK PREVENTION PERTAINING TO THE HOME:  Any stairs in or around the home? {YES/NO:21197} If so, are there any without handrails? {YES/NO:21197} Home free of loose throw rugs in walkways, pet beds, electrical cords, etc? {YES/NO:21197} Adequate lighting in your home to reduce risk of falls? {YES/NO:21197}  ASSISTIVE DEVICES UTILIZED TO PREVENT FALLS:  Life alert? {YES/NO:21197} Use of a cane, walker or  w/c? {YES/NO:21197} Grab bars in the bathroom? {YES/NO:21197} Shower chair or bench in shower? {YES/NO:21197} Elevated toilet seat or a handicapped toilet? {YES/NO:21197}  Cognitive Function:        Immunizations Immunization History  Administered Date(s) Administered   Influenza Split 02/10/2011  Influenza,inj,Quad PF,6+ Mos 04/13/2015, 02/02/2017, 02/11/2018, 02/17/2021, 01/04/2022   PFIZER(Purple Top)SARS-COV-2 Vaccination 10/28/2019, 11/28/2019   PNEUMOCOCCAL CONJUGATE-20 03/31/2021   Tdap 02/10/2011, 02/17/2021    Flu Vaccine status: Up to date  Covid-19 vaccine status: Information provided on how to obtain vaccines.   Qualifies for Shingles Vaccine? Yes   Zostavax completed No   Shingrix Completed?: No.    Education has been provided regarding the importance of this vaccine. Patient has been advised to call insurance company to determine out of pocket expense if they have not yet received this vaccine. Advised may also receive vaccine at local pharmacy or Health Dept. Verbalized acceptance and understanding.  Screening Tests Health Maintenance  Topic Date Due   COLONOSCOPY (Pts 45-29yrs Insurance coverage will need to be confirmed)  Never done   Zoster Vaccines- Shingrix (1 of 2) Never done   PAP SMEAR-Modifier  07/10/2012   FOOT EXAM  03/31/2022   Diabetic kidney evaluation - Urine ACR  06/01/2022   HEMOGLOBIN A1C  07/05/2022   OPHTHALMOLOGY EXAM  07/20/2022   Diabetic kidney evaluation - GFR measurement  01/05/2023   Medicare Annual Wellness (AWV)  03/15/2023   MAMMOGRAM  04/14/2023   INFLUENZA VACCINE  Completed   Hepatitis C Screening  Completed   HIV Screening  Completed   HPV VACCINES  Aged Out   COVID-19 Vaccine  Discontinued    Health Maintenance  Health Maintenance Due  Topic Date Due   COLONOSCOPY (Pts 45-66yrs Insurance coverage will need to be confirmed)  Never done   Zoster Vaccines- Shingrix (1 of 2) Never done   PAP SMEAR-Modifier   07/10/2012    {Colorectal cancer screening:2101809}  Mammogram status: Completed 04/13/21. Repeat every year  Lung Cancer Screening: (Low Dose CT Chest recommended if Age 62-80 years, 30 pack-year currently smoking OR have quit w/in 15years.) does not qualify.   Additional Screening:  Hepatitis C Screening: does qualify; Completed 04/13/15  Vision Screening: Recommended annual ophthalmology exams for early detection of glaucoma and other disorders of the eye. Is the patient up to date with their annual eye exam?  Yes  Who is the provider or what is the name of the office in which the patient attends annual eye exams? Dr. Nile Riggs If pt is not established with a provider, would they like to be referred to a provider to establish care?  N/A .   Dental Screening: Recommended annual dental exams for proper oral hygiene  Community Resource Referral / Chronic Care Management: CRR required this visit?  {YES/NO:21197}  CCM required this visit?  {YES/NO:21197}     Plan:     I have personally reviewed and noted the following in the patient's chart:   Medical and social history Use of alcohol, tobacco or illicit drugs  Current medications and supplements including opioid prescriptions. Patient is not currently taking opioid prescriptions. Functional ability and status Nutritional status Physical activity Advanced directives List of other physicians Hospitalizations, surgeries, and ER visits in previous 12 months Vitals Screenings to include cognitive, depression, and falls Referrals and appointments  In addition, I have reviewed and discussed with patient certain preventive protocols, quality metrics, and best practice recommendations. A written personalized care plan for preventive services as well as general preventive health recommendations were provided to patient.     Wanda Plump, RN   03/13/2022   Nurse Notes:  Ms. Railsback , Thank you for taking time to come for your  Medicare Wellness Visit. I appreciate your ongoing commitment to  your health goals. Please review the following plan we discussed and let me know if I can assist you in the future.   These are the goals we discussed:  Goals   None     This is a list of the screening recommended for you and due dates:  Health Maintenance  Topic Date Due   Colon Cancer Screening  Never done   Zoster (Shingles) Vaccine (1 of 2) Never done   Pap Smear  07/10/2012   Complete foot exam   03/31/2022   Yearly kidney health urinalysis for diabetes  06/01/2022   Hemoglobin A1C  07/05/2022   Eye exam for diabetics  07/20/2022   Yearly kidney function blood test for diabetes  01/05/2023   Medicare Annual Wellness Visit  03/15/2023   Mammogram  04/14/2023   Flu Shot  Completed   Hepatitis C Screening: USPSTF Recommendation to screen - Ages 18-79 yo.  Completed   HIV Screening  Completed   HPV Vaccine  Aged Out   COVID-19 Vaccine  Discontinued

## 2022-03-14 ENCOUNTER — Ambulatory Visit: Payer: Medicare Other | Admitting: *Deleted

## 2022-03-14 ENCOUNTER — Other Ambulatory Visit: Payer: Self-pay | Admitting: *Deleted

## 2022-03-14 ENCOUNTER — Other Ambulatory Visit: Payer: Self-pay | Admitting: Allergy

## 2022-03-14 DIAGNOSIS — Z0000 Encounter for general adult medical examination without abnormal findings: Secondary | ICD-10-CM

## 2022-03-14 MED ORDER — LEVOCETIRIZINE DIHYDROCHLORIDE 5 MG PO TABS: 5.0000 mg | ORAL_TABLET | Freq: Two times a day (BID) | ORAL | 5 refills | Status: DC

## 2022-03-14 NOTE — Telephone Encounter (Signed)
Medication has been sent in to correct pharmacy closer to the patients new residence.

## 2022-03-14 NOTE — Telephone Encounter (Signed)
Per Dr Julaine Hua note:  Contiue Xyzal (levocetirizine) '5mg'$  tablet and increase to 2 tabs a day (max dosing of antihistamine like xyzal is 4 tabs a day).     Patient called requesting a prescription change as she is running out with the amount we are sending in.   Patient has moved. Please use [facility].

## 2022-03-17 ENCOUNTER — Other Ambulatory Visit (HOSPITAL_COMMUNITY): Payer: Self-pay

## 2022-03-20 ENCOUNTER — Telehealth: Payer: Self-pay

## 2022-03-20 NOTE — Telephone Encounter (Signed)
PA request received via CMM through [employer] for Levocetirizine for a quantity exception.  PA has been submitted and is awaiting determination.   Key: [id number]

## 2022-03-20 NOTE — Telephone Encounter (Signed)
PA has been DENIED for the quantity of 2 tablets per day of Levocetirizine '5mg'$  tablets.

## 2022-03-21 NOTE — Telephone Encounter (Signed)
Patient called about levocetirizine was change to 2 times a day instead of 1 time a day. Needs to have it sent to [facility] on [address]. [phone]

## 2022-03-22 ENCOUNTER — Telehealth: Payer: Self-pay | Admitting: Allergy

## 2022-03-22 NOTE — Telephone Encounter (Signed)
Jamie Salazar, did it give more information about a trial and failure or anything from the denial?

## 2022-03-22 NOTE — Telephone Encounter (Signed)
Patient called and stated that she ran out of medication Levocetirizine too soon because Dr Nelva Bush increased her dose. Patient states that insurance company needs verification that dose was increased so they can approve a sooner refill for patient. Patients pharmacy is [facility] on [address]. Patients states she needs this as soon as possible. Patients call back number is [phone]

## 2022-03-22 NOTE — Telephone Encounter (Signed)
I called and spoke with the patient and advised that we are working on a prior authorization for the Lecoveritizine and we will call her back with an update. Patient verbalized understanding.

## 2022-03-23 NOTE — Telephone Encounter (Signed)
Unfortunately it did not give any additional information other than the plan limits only cover 1 tablet per day. I did notate that the patient had previously been on the 1 tablet per day.

## 2022-03-23 NOTE — Telephone Encounter (Signed)
Tried to call pt about this but the call can not be completed as dialed and I tried 3 different times

## 2022-03-23 NOTE — Telephone Encounter (Signed)
Pa for levocetirizine for bid was denied do you just want pt to buy otc to over her for the month

## 2022-03-24 ENCOUNTER — Other Ambulatory Visit: Payer: Self-pay | Admitting: Cardiology

## 2022-03-24 DIAGNOSIS — R000 Tachycardia, unspecified: Secondary | ICD-10-CM

## 2022-03-24 MED ORDER — LEVOCETIRIZINE DIHYDROCHLORIDE 5 MG PO TABS: 10.0000 mg | ORAL_TABLET | Freq: Two times a day (BID) | ORAL | 2 refills | Status: DC

## 2022-03-24 NOTE — Telephone Encounter (Signed)
Patient called back and expressed that she called her insurance and they expressed to patient that they would approve the 120 tablets monthly per Dr. Jeralyn Ruths AVS. I have sent in a new prescription to the pharmacy on file as discussed with patient.

## 2022-03-24 NOTE — Telephone Encounter (Signed)
Called and spoke with the patient and advised of the PA and Dr. Jeralyn Ruths recommendation. She stated that she called and spoke with the insurance company and they stated that they would cover it but they are waiting for Dr. Nelva Bush to confirm the increased dosage. She provided me with number [phone]. I advised I will call and speak with them and will call her back, patient verbalized understanding.

## 2022-03-24 NOTE — Telephone Encounter (Signed)
Called and left a voicemail asking for patient to return call to discuss.  °

## 2022-03-27 NOTE — Telephone Encounter (Signed)
Patient called to give an update.The pharmacy says the medication could possibly be approved if we send it in a as taking 1 pill twice a day or two pills a day. Patient states that is what she does typically take. I did recommend her trying to get it OTC for now and that it did come in generic form as she concerned about cost.

## 2022-03-28 ENCOUNTER — Other Ambulatory Visit: Payer: Self-pay | Admitting: *Deleted

## 2022-03-28 MED ORDER — LEVOCETIRIZINE DIHYDROCHLORIDE 5 MG PO TABS: 5.0000 mg | ORAL_TABLET | Freq: Two times a day (BID) | ORAL | 5 refills | Status: DC

## 2022-03-28 NOTE — Telephone Encounter (Signed)
I resent prescription for 1 tablet 2 times daily. Will call pharmacy shortly to check and see about coverage.

## 2022-04-19 DIAGNOSIS — E1165 Type 2 diabetes mellitus with hyperglycemia: Secondary | ICD-10-CM | POA: Diagnosis not present

## 2022-04-25 ENCOUNTER — Other Ambulatory Visit: Payer: Self-pay | Admitting: Internal Medicine

## 2022-04-25 DIAGNOSIS — E785 Hyperlipidemia, unspecified: Secondary | ICD-10-CM

## 2022-04-25 DIAGNOSIS — E118 Type 2 diabetes mellitus with unspecified complications: Secondary | ICD-10-CM

## 2022-04-25 DIAGNOSIS — E1121 Type 2 diabetes mellitus with diabetic nephropathy: Secondary | ICD-10-CM

## 2022-05-15 ENCOUNTER — Ambulatory Visit: Payer: Medicare Other | Admitting: Podiatry

## 2022-05-31 ENCOUNTER — Ambulatory Visit: Payer: Medicare Other | Admitting: Allergy

## 2022-06-26 ENCOUNTER — Ambulatory Visit: Payer: Medicare Other | Admitting: Podiatry

## 2022-07-10 ENCOUNTER — Ambulatory Visit: Payer: Medicare Other | Admitting: Internal Medicine

## 2022-07-11 ENCOUNTER — Ambulatory Visit: Payer: Medicare Other | Admitting: Internal Medicine

## 2022-07-18 ENCOUNTER — Other Ambulatory Visit: Payer: Self-pay | Admitting: Internal Medicine

## 2022-07-18 DIAGNOSIS — Z1231 Encounter for screening mammogram for malignant neoplasm of breast: Secondary | ICD-10-CM

## 2022-07-20 ENCOUNTER — Ambulatory Visit (INDEPENDENT_AMBULATORY_CARE_PROVIDER_SITE_OTHER): Payer: 59 | Admitting: Podiatry

## 2022-07-20 DIAGNOSIS — B351 Tinea unguium: Secondary | ICD-10-CM

## 2022-07-20 DIAGNOSIS — H2513 Age-related nuclear cataract, bilateral: Secondary | ICD-10-CM | POA: Diagnosis not present

## 2022-07-20 DIAGNOSIS — E1142 Type 2 diabetes mellitus with diabetic polyneuropathy: Secondary | ICD-10-CM | POA: Diagnosis not present

## 2022-07-20 DIAGNOSIS — M79675 Pain in left toe(s): Secondary | ICD-10-CM | POA: Diagnosis not present

## 2022-07-20 DIAGNOSIS — E119 Type 2 diabetes mellitus without complications: Secondary | ICD-10-CM | POA: Diagnosis not present

## 2022-07-20 DIAGNOSIS — M10471 Other secondary gout, right ankle and foot: Secondary | ICD-10-CM

## 2022-07-20 DIAGNOSIS — H04123 Dry eye syndrome of bilateral lacrimal glands: Secondary | ICD-10-CM | POA: Diagnosis not present

## 2022-07-20 DIAGNOSIS — M2042 Other hammer toe(s) (acquired), left foot: Secondary | ICD-10-CM | POA: Diagnosis not present

## 2022-07-20 DIAGNOSIS — M2041 Other hammer toe(s) (acquired), right foot: Secondary | ICD-10-CM | POA: Diagnosis not present

## 2022-07-20 DIAGNOSIS — H25013 Cortical age-related cataract, bilateral: Secondary | ICD-10-CM | POA: Diagnosis not present

## 2022-07-20 DIAGNOSIS — M79674 Pain in right toe(s): Secondary | ICD-10-CM

## 2022-07-20 LAB — HM DIABETES EYE EXAM

## 2022-07-20 NOTE — Progress Notes (Signed)
Subjective: Chief Complaint  Patient presents with   routine foot care     Patient interested in diabetic shoes    62 year old female presents the office today with above concerns.  She states the nails are thickened elongated she is not able to trim them herself and are causing discomfort.  She also would like to get measured for diabetic shoes.  She also reports she thinks that she has gout "all over my body".  She is also been experiencing numbness and tingling and burning to her feet.  She has previously diagnosed with neuropathy but she is not currently on any medications for this.  Objective: AAO x3, NAD Sensation is intact with Semmes Weinstein monofilament but she is still describing nerve type symptoms. DP/PT pulses palpable bilaterally, CRT less than 3 seconds Nails are hypertrophic, dystrophic, brittle, discolored, elongated 10. No surrounding redness or drainage. Tenderness nails 1-5 bilaterally. No open lesions or pre-ulcerative lesions are identified today. Hammertoes present Dry skin present. There is currently no edema, erythema consistent with a nail today there is no pain. No pain with calf compression, swelling, warmth, erythema  Assessment: 63 year old female with symptomatic onychosis, type 2 diabetes with neuropathy  Plan: -All treatment options discussed with the patient including all alternatives, risks, complications.  -I currently do not think that she has gout but further history of this.  She also agreed that she is currently not in a flare right now.  We discussed checking uric acid level.  She follows up with her PCP next week and she would like to wait for blood work. -We discussed gabapentin. She would like to hold off on this until she sees her primary care doctor.  Discussed topical medication she can use for nerve symptoms all. -Nails were sharply debrided x 10 without any complications or bleeding -She will be scheduled for management of diabetic  shoes. -Discussed moisturizers. -Patient encouraged to call the office with any questions, concerns, change in symptoms.   Trula Slade DPM

## 2022-07-20 NOTE — Patient Instructions (Signed)
For the nerve symptoms in your feet you can try Aspercreme with lidocaine, capsaicin cream  --   Choose a moisturizer from  the list below:  For normal skin: Moisturize feet once daily; do not apply between toes A.  CeraVe Daily Moisturizing Lotion B.  Lubriderm Advanced Therapy Lotion or Lubriderm Intense Skin Repair Lotion C.  Vaseline Intensive Care Lotion D.  Gold Bond Ultimate Diabetic Foot Lotion E.  Eucerin Intensive Repair Moisturizing Lotion  For extremely dry, cracked feet: moisturize feet once daily; do not apply between toes A. CeraVe Healing Ointment B. Eucerin Aquaphor Repairing Ointment (may be labeled Aquaphor Healing Ointment) C. Vaseline Petroleum Healing Jelly   If you have problems reaching your feet: apply to feet once daily; do not apply between toes A.  Eucerin Aquaphor Ointment Body Spray  B.  Vaseline Intensive Care Spray Moisturizer (Unscented,  Cocoa Radiant Spray or Aloe Smooth Spray)

## 2022-07-21 ENCOUNTER — Other Ambulatory Visit: Payer: Self-pay | Admitting: Allergy

## 2022-07-24 ENCOUNTER — Telehealth: Payer: Self-pay

## 2022-07-24 NOTE — Telephone Encounter (Signed)
I tried reaching patient in regards to Xyzal and to make patient a follow up appointment. I left a message to call the office back.

## 2022-07-24 NOTE — Telephone Encounter (Signed)
PA request received via CMM for Levocetirizine Dihydrochloride 5MG  tablets  PA has been submitted to [employer] Medicare Part D and is pending determination.   Key: [id number]

## 2022-07-25 ENCOUNTER — Other Ambulatory Visit: Payer: Self-pay | Admitting: *Deleted

## 2022-07-25 MED ORDER — LEVOCETIRIZINE DIHYDROCHLORIDE 5 MG PO TABS: ORAL_TABLET | ORAL | 5 refills | Status: AC

## 2022-07-25 NOTE — Telephone Encounter (Signed)
Unfortunately insurance does not want to cover the higher dose of 2 tablets per day of Levocetirizine. Do you want the patient to purchase over the counter?

## 2022-07-25 NOTE — Telephone Encounter (Signed)
PA has been DENIED. Denial letter has been attached in patients documents.  

## 2022-07-25 NOTE — Telephone Encounter (Signed)
New prescription has been sent in for once daily. Called patient and advised of Dr. Jeralyn Ruths recommendation. Patient verbalized understanding and will call back if she needs anything further.

## 2022-07-27 ENCOUNTER — Ambulatory Visit: Payer: Medicare Other | Admitting: Internal Medicine

## 2022-08-01 ENCOUNTER — Encounter: Payer: Self-pay | Admitting: Internal Medicine

## 2022-08-01 ENCOUNTER — Ambulatory Visit (INDEPENDENT_AMBULATORY_CARE_PROVIDER_SITE_OTHER): Payer: 59 | Admitting: Internal Medicine

## 2022-08-01 ENCOUNTER — Other Ambulatory Visit: Payer: 59

## 2022-08-01 VITALS — BP 152/86 | HR 90 | Temp 97.8°F | Resp 16 | Ht 62.0 in | Wt 289.0 lb

## 2022-08-01 DIAGNOSIS — E785 Hyperlipidemia, unspecified: Secondary | ICD-10-CM

## 2022-08-01 DIAGNOSIS — R000 Tachycardia, unspecified: Secondary | ICD-10-CM | POA: Insufficient documentation

## 2022-08-01 DIAGNOSIS — Z7985 Long-term (current) use of injectable non-insulin antidiabetic drugs: Secondary | ICD-10-CM

## 2022-08-01 DIAGNOSIS — E118 Type 2 diabetes mellitus with unspecified complications: Secondary | ICD-10-CM | POA: Diagnosis not present

## 2022-08-01 DIAGNOSIS — E1121 Type 2 diabetes mellitus with diabetic nephropathy: Secondary | ICD-10-CM

## 2022-08-01 DIAGNOSIS — K219 Gastro-esophageal reflux disease without esophagitis: Secondary | ICD-10-CM | POA: Diagnosis not present

## 2022-08-01 DIAGNOSIS — M1A09X0 Idiopathic chronic gout, multiple sites, without tophus (tophi): Secondary | ICD-10-CM

## 2022-08-01 DIAGNOSIS — Z0001 Encounter for general adult medical examination with abnormal findings: Secondary | ICD-10-CM

## 2022-08-01 DIAGNOSIS — Z0000 Encounter for general adult medical examination without abnormal findings: Secondary | ICD-10-CM

## 2022-08-01 DIAGNOSIS — I10 Essential (primary) hypertension: Secondary | ICD-10-CM

## 2022-08-01 DIAGNOSIS — Z124 Encounter for screening for malignant neoplasm of cervix: Secondary | ICD-10-CM

## 2022-08-01 LAB — BASIC METABOLIC PANEL
BUN: 14 mg/dL (ref 6–23)
CO2: 26 mEq/L (ref 19–32)
Calcium: 9.5 mg/dL (ref 8.4–10.5)
Chloride: 103 mEq/L (ref 96–112)
Creatinine, Ser: 0.92 mg/dL (ref 0.40–1.20)
GFR: 66.45 mL/min (ref >60.00)
Glucose, Bld: 81 mg/dL (ref 70–99)
Potassium: 3.5 mEq/L (ref 3.5–5.1)
Sodium: 139 mEq/L (ref 135–145)

## 2022-08-01 LAB — URINALYSIS, ROUTINE W REFLEX MICROSCOPIC
Hgb urine dipstick: NEGATIVE
Ketones, ur: NEGATIVE
Leukocytes,Ua: NEGATIVE
Nitrite: NEGATIVE
Specific Gravity, Urine: >=1.03 — AB (ref 1.000–1.030)
Total Protein, Urine: 30 — AB
Urine Glucose: NEGATIVE
Urobilinogen, UA: 0.2 (ref 0.0–1.0)
pH: 5.5 (ref 5.0–8.0)

## 2022-08-01 LAB — MICROALBUMIN / CREATININE URINE RATIO
Creatinine,U: 251.7 mg/dL
Microalb Creat Ratio: 2.9 mg/g (ref 0.0–30.0)
Microalb, Ur: 7.2 mg/dL — ABNORMAL HIGH (ref 0.0–1.9)

## 2022-08-01 LAB — LIPID PANEL
Cholesterol: 119 mg/dL (ref 0–200)
HDL: 57.6 mg/dL (ref >39.00)
LDL Cholesterol: 50 mg/dL (ref 0–99)
NonHDL: 61.82
Total CHOL/HDL Ratio: 2
Triglycerides: 61 mg/dL (ref 0.0–149.0)
VLDL: 12.2 mg/dL (ref 0.0–40.0)

## 2022-08-01 LAB — CBC WITH DIFFERENTIAL/PLATELET
Basophils Absolute: 0 10*3/uL (ref 0.0–0.1)
Basophils Relative: 0.6 % (ref 0.0–3.0)
Eosinophils Absolute: 0.6 10*3/uL (ref 0.0–0.7)
Eosinophils Relative: 7.7 % — ABNORMAL HIGH (ref 0.0–5.0)
HCT: 42.2 % (ref 36.0–46.0)
Hemoglobin: 13.7 g/dL (ref 12.0–15.0)
Lymphocytes Relative: 23.6 % (ref 12.0–46.0)
Lymphs Abs: 1.8 10*3/uL (ref 0.7–4.0)
MCHC: 32.6 g/dL (ref 30.0–36.0)
MCV: 77.5 fl — ABNORMAL LOW (ref 78.0–100.0)
Monocytes Absolute: 0.5 10*3/uL (ref 0.1–1.0)
Monocytes Relative: 6.2 % (ref 3.0–12.0)
Neutro Abs: 4.7 10*3/uL (ref 1.4–7.7)
Neutrophils Relative %: 61.9 % (ref 43.0–77.0)
Platelets: 220 10*3/uL (ref 150.0–400.0)
RBC: 5.44 Mil/uL — ABNORMAL HIGH (ref 3.87–5.11)
RDW: 15.5 % (ref 11.5–15.5)
WBC: 7.7 10*3/uL (ref 4.0–10.5)

## 2022-08-01 LAB — TSH: TSH: 4.42 u[IU]/mL (ref 0.35–5.50)

## 2022-08-01 LAB — HEPATIC FUNCTION PANEL
ALT: 15 U/L (ref 0–35)
AST: 21 U/L (ref 0–37)
Albumin: 4.1 g/dL (ref 3.5–5.2)
Alkaline Phosphatase: 99 U/L (ref 39–117)
Bilirubin, Direct: 0.1 mg/dL (ref 0.0–0.3)
Total Bilirubin: 0.5 mg/dL (ref 0.2–1.2)
Total Protein: 8.3 g/dL (ref 6.0–8.3)

## 2022-08-01 LAB — HEMOGLOBIN A1C: Hgb A1c MFr Bld: 5.5 % (ref 4.6–6.5)

## 2022-08-01 LAB — URIC ACID: Uric Acid, Serum: 7 mg/dL (ref 2.4–7.0)

## 2022-08-01 MED ORDER — MOUNJARO 10 MG/0.5ML ~~LOC~~ SOAJ
10.0000 mg | SUBCUTANEOUS | 0 refills | Status: DC
Start: 2022-08-01 — End: 2022-08-27

## 2022-08-01 MED ORDER — ESOMEPRAZOLE MAGNESIUM 40 MG PO PACK
40.0000 mg | PACK | Freq: Every day | ORAL | 1 refills | Status: DC
Start: 2022-08-01 — End: 2022-10-13

## 2022-08-01 MED ORDER — KERENDIA 20 MG PO TABS
1.0000 | ORAL_TABLET | Freq: Every day | ORAL | 1 refills | Status: DC
Start: 2022-08-01 — End: 2023-01-26

## 2022-08-01 MED ORDER — ROSUVASTATIN CALCIUM 5 MG PO TABS: ORAL_TABLET | ORAL | 1 refills | Status: DC

## 2022-08-01 NOTE — Progress Notes (Unsigned)
Subjective:  Patient ID: Jamie Salazar, female    DOB: 1960/07/11  Age: 62 y.o. MRN: [id number]  CC: Annual Exam, Diabetes, Hyperlipidemia, Hypertension, and Gastroesophageal Reflux   HPI Orly L Doubet presents for a CPX and f/up --  She does not exercise.  Outpatient Medications Prior to Visit  Medication Sig Dispense Refill   allopurinol (ZYLOPRIM) 100 MG tablet TAKE 1 TABLET(100 MG) BY MOUTH DAILY 90 tablet 0   colchicine 0.6 MG tablet Take 1.2mg  (2 tablets) then 0.6mg  (1 tablet) 1 hour after. Then, take 1 tablet every day for 7 days. 10 tablet 2   levocetirizine (XYZAL) 5 MG tablet Take one tablet by mouth daily. 30 tablet 5   metoprolol succinate (TOPROL-XL) 25 MG 24 hr tablet TAKE 1 TABLET BY MOUTH EVERY MORNING HOLD IF SYSTOLIC PRESSURE(TOP NUMBER) LESS THAN 100 OR PULSE LESS THAN 60 90 tablet 1   MOUNJARO 10 MG/0.5ML Pen INJECT 10 MG UNDER THE SKIN ONCE A WEEK 6 mL 0   torsemide (DEMADEX) 20 MG tablet TAKE 1 TABLET(20 MG) BY MOUTH DAILY 90 tablet 0   KERENDIA 20 MG TABS TAKE 1 TABLET BY MOUTH DAILY 90 tablet 0   rosuvastatin (CRESTOR) 5 MG tablet TAKE 1 TABLET(5 MG) BY MOUTH DAILY 90 tablet 0   No facility-administered medications prior to visit.    ROS Review of Systems  HENT:  Negative for sore throat, trouble swallowing and voice change.        ++ heartburn  Respiratory:  Negative for chest tightness, shortness of breath and wheezing.   Cardiovascular:  Negative for chest pain, palpitations and leg swelling.  Gastrointestinal:  Negative for abdominal pain, diarrhea, nausea and vomiting.  Genitourinary: Negative.  Negative for difficulty urinating.  Musculoskeletal:  Positive for arthralgias. Negative for back pain and myalgias.  Skin: Negative.   Neurological: Negative.   Hematological:  Negative for adenopathy. Does not bruise/bleed easily.  Psychiatric/Behavioral: Negative.      Objective:  BP (!) 152/86 (BP Location: Left Arm, Patient Position:  Sitting, Cuff Size: Large)   Pulse 90   Temp 97.8 F (36.6 C) (Oral)   Resp 16   Ht 5\' 2"  (1.575 m)   Wt 289 lb (131.1 kg)   SpO2 99%   BMI 52.86 kg/m   BP Readings from Last 3 Encounters:  08/01/22 (!) 152/86  01/25/22 116/76  01/04/22 138/88    Wt Readings from Last 3 Encounters:  08/01/22 289 lb (131.1 kg)  01/04/22 297 lb (134.7 kg)  10/12/21 (!) 301 lb (136.5 kg)    Physical Exam Vitals reviewed.  Constitutional:      Appearance: She is obese. She is ill-appearing.  HENT:     Nose: Nose normal.     Mouth/Throat:     Mouth: Mucous membranes are moist.  Eyes:     General: No scleral icterus.    Conjunctiva/sclera: Conjunctivae normal.  Cardiovascular:     Rate and Rhythm: Normal rate and regular rhythm.     Pulses: Normal pulses.     Heart sounds: Normal heart sounds, S1 normal and S2 normal. No murmur heard.    No friction rub. No gallop.     Comments: EKG- NSR, 90 bpm Low voltage c/w body No LVH or Q waves Pulmonary:     Effort: Pulmonary effort is normal.     Breath sounds: No stridor. No wheezing, rhonchi or rales.  Abdominal:     General: Abdomen is protuberant. Bowel sounds are normal.  There is no distension.     Palpations: Abdomen is soft. There is no hepatomegaly, splenomegaly or mass.     Tenderness: There is no abdominal tenderness.  Musculoskeletal:     Cervical back: Neck supple.     Right lower leg: No edema.     Left lower leg: No edema.  Lymphadenopathy:     Cervical: No cervical adenopathy.  Neurological:     Mental Status: She is alert.     Lab Results  Component Value Date   WBC 7.7 08/01/2022   HGB 13.7 08/01/2022   HCT 42.2 08/01/2022   PLT 220.0 08/01/2022   GLUCOSE 81 08/01/2022   CHOL 119 08/01/2022   TRIG 61.0 08/01/2022   HDL 57.60 08/01/2022   LDLCALC 50 08/01/2022   ALT 15 08/01/2022   AST 21 08/01/2022   NA 139 08/01/2022   K 3.5 08/01/2022   CL 103 08/01/2022   CREATININE 0.92 08/01/2022   BUN 14 08/01/2022    CO2 26 08/01/2022   TSH 4.42 08/01/2022   HGBA1C 5.5 08/01/2022   MICROALBUR 7.2 (H) 08/01/2022    MM DIAG BREAST TOMO UNI LEFT  Result Date: 05/10/2021 CLINICAL DATA:  Screening recall for a possible left breast mass. EXAM: DIGITAL DIAGNOSTIC UNILATERAL LEFT MAMMOGRAM WITH TOMOSYNTHESIS AND CAD; ULTRASOUND LEFT BREAST LIMITED TECHNIQUE: Left digital diagnostic mammography and breast tomosynthesis was performed. The images were evaluated with computer-aided detection.; Targeted ultrasound examination of the left breast was performed. COMPARISON:  Previous exam(s). ACR Breast Density Category a: The breast tissue is almost entirely fatty. FINDINGS: The possible mass noted in the medial left breast persists on the spot compression diagnostic images. It appears as a small oval circumscribed mass measuring 5 mm, projecting at approximately 9 o'clock. Targeted ultrasound is performed, showing a small cyst, with a few thin internal septations, but no other complicating features, at 9:30 o'clock, 7 cm from the nipple, middle depth, measuring 5 x 4 x 4 mm, consistent in size, shape and location to the mammographic mass. There are no solid masses or suspicious lesions. IMPRESSION: 1. No evidence of breast malignancy. 2. Benign left breast cyst. RECOMMENDATION: Screening mammogram in one year.(Code:SM-B-01Y) I have discussed the findings and recommendations with the patient. If applicable, a reminder letter will be sent to the patient regarding the next appointment. BI-RADS CATEGORY  2: Benign. Electronically Signed   By: Amie Portland M.D.   On: 05/10/2021 13:44   US BREAST LTD UNI LEFT INC AXILLA  Result Date: 05/10/2021 CLINICAL DATA:  Screening recall for a possible left breast mass. EXAM: DIGITAL DIAGNOSTIC UNILATERAL LEFT MAMMOGRAM WITH TOMOSYNTHESIS AND CAD; ULTRASOUND LEFT BREAST LIMITED TECHNIQUE: Left digital diagnostic mammography and breast tomosynthesis was performed. The images were evaluated with  computer-aided detection.; Targeted ultrasound examination of the left breast was performed. COMPARISON:  Previous exam(s). ACR Breast Density Category a: The breast tissue is almost entirely fatty. FINDINGS: The possible mass noted in the medial left breast persists on the spot compression diagnostic images. It appears as a small oval circumscribed mass measuring 5 mm, projecting at approximately 9 o'clock. Targeted ultrasound is performed, showing a small cyst, with a few thin internal septations, but no other complicating features, at 9:30 o'clock, 7 cm from the nipple, middle depth, measuring 5 x 4 x 4 mm, consistent in size, shape and location to the mammographic mass. There are no solid masses or suspicious lesions. IMPRESSION: 1. No evidence of breast malignancy. 2. Benign left breast cyst. RECOMMENDATION:  Screening mammogram in one year.(Code:SM-B-01Y) I have discussed the findings and recommendations with the patient. If applicable, a reminder letter will be sent to the patient regarding the next appointment. BI-RADS CATEGORY  2: Benign. Electronically Signed   By: Amie Portlandavid  Ormond M.D.   On: 05/10/2021 13:44   Assessment & Plan:  Diabetic nephropathy associated with type 2 diabetes mellitus -     Microalbumin / creatinine urine ratio; Future -     Urinalysis, Routine w reflex microscopic; Future -     Chauncey MannKerendia; Take 1 tablet (20 mg total) by mouth daily.  Dispense: 90 tablet; Refill: 1  Essential hypertension -     TSH; Future -     Urinalysis, Routine w reflex microscopic; Future -     Hepatic function panel; Future -     CBC with Differential/Platelet; Future -     Basic metabolic panel; Future -     EKG 12-Lead  Hyperlipidemia with target LDL less than 130 -     Lipid panel; Future -     Hepatic function panel; Future -     Rosuvastatin Calcium; TAKE 1 TABLET(5 MG) BY MOUTH DAILY  Dispense: 90 tablet; Refill: 1  Idiopathic chronic gout of multiple sites without tophus -     Uric  acid; Future  Type II diabetes mellitus with manifestations -     Hemoglobin A1c; Future -     Basic metabolic panel; Future -     Chauncey MannKerendia; Take 1 tablet (20 mg total) by mouth daily.  Dispense: 90 tablet; Refill: 1  Tachycardia  Gastroesophageal reflux disease without esophagitis -     Esomeprazole Magnesium; Take 40 mg by mouth daily before breakfast.  Dispense: 90 each; Refill: 1  Encounter for general adult medical examination with abnormal findings  Screening for cervical cancer -     Ambulatory referral to Gynecology     Follow-up: Return in about 6 months (around 01/31/2023).  Sanda Lingerhomas Khiley Lieser, MD

## 2022-08-01 NOTE — Patient Instructions (Signed)

## 2022-08-02 MED ORDER — ALLOPURINOL 100 MG PO TABS: 100.0000 mg | ORAL_TABLET | Freq: Every day | ORAL | 1 refills | Status: DC

## 2022-08-02 MED ORDER — TORSEMIDE 20 MG PO TABS
ORAL_TABLET | ORAL | 1 refills | Status: DC
Start: 2022-08-02 — End: 2022-10-13

## 2022-08-04 ENCOUNTER — Ambulatory Visit: Payer: 59

## 2022-08-27 ENCOUNTER — Other Ambulatory Visit: Payer: Self-pay | Admitting: Internal Medicine

## 2022-08-27 DIAGNOSIS — E118 Type 2 diabetes mellitus with unspecified complications: Secondary | ICD-10-CM

## 2022-09-04 ENCOUNTER — Ambulatory Visit
Admission: RE | Admit: 2022-09-04 | Discharge: 2022-09-04 | Disposition: A | Discharge status: Home / Self Care | Payer: 59 | Source: Ambulatory Visit | Attending: Internal Medicine | Admitting: Internal Medicine

## 2022-09-04 DIAGNOSIS — Z1231 Encounter for screening mammogram for malignant neoplasm of breast: Secondary | ICD-10-CM | POA: Diagnosis not present

## 2022-09-24 ENCOUNTER — Other Ambulatory Visit: Payer: Self-pay | Admitting: Cardiology

## 2022-09-24 DIAGNOSIS — R000 Tachycardia, unspecified: Secondary | ICD-10-CM

## 2022-09-26 IMAGING — MG MM DIGITAL DIAGNOSTIC UNILAT*L* W/ TOMO W/ CAD
4 series · 4 of 12 positions shown · non-contrast
Comparison: Previous exam(s).

ACR Breast Density Category a: The breast tissue is almost entirely
fatty.

CLINICAL DATA: Screening recall for a possible left breast mass.

EXAM:
DIGITAL DIAGNOSTIC UNILATERAL LEFT MAMMOGRAM WITH TOMOSYNTHESIS AND
CAD; ULTRASOUND LEFT BREAST LIMITED
TECHNIQUE: Left digital diagnostic mammography and breast tomosynthesis was
performed. The images were evaluated with computer-aided detection.;
Targeted ultrasound examination of the left breast was performed.

[L MLO synth-2D]
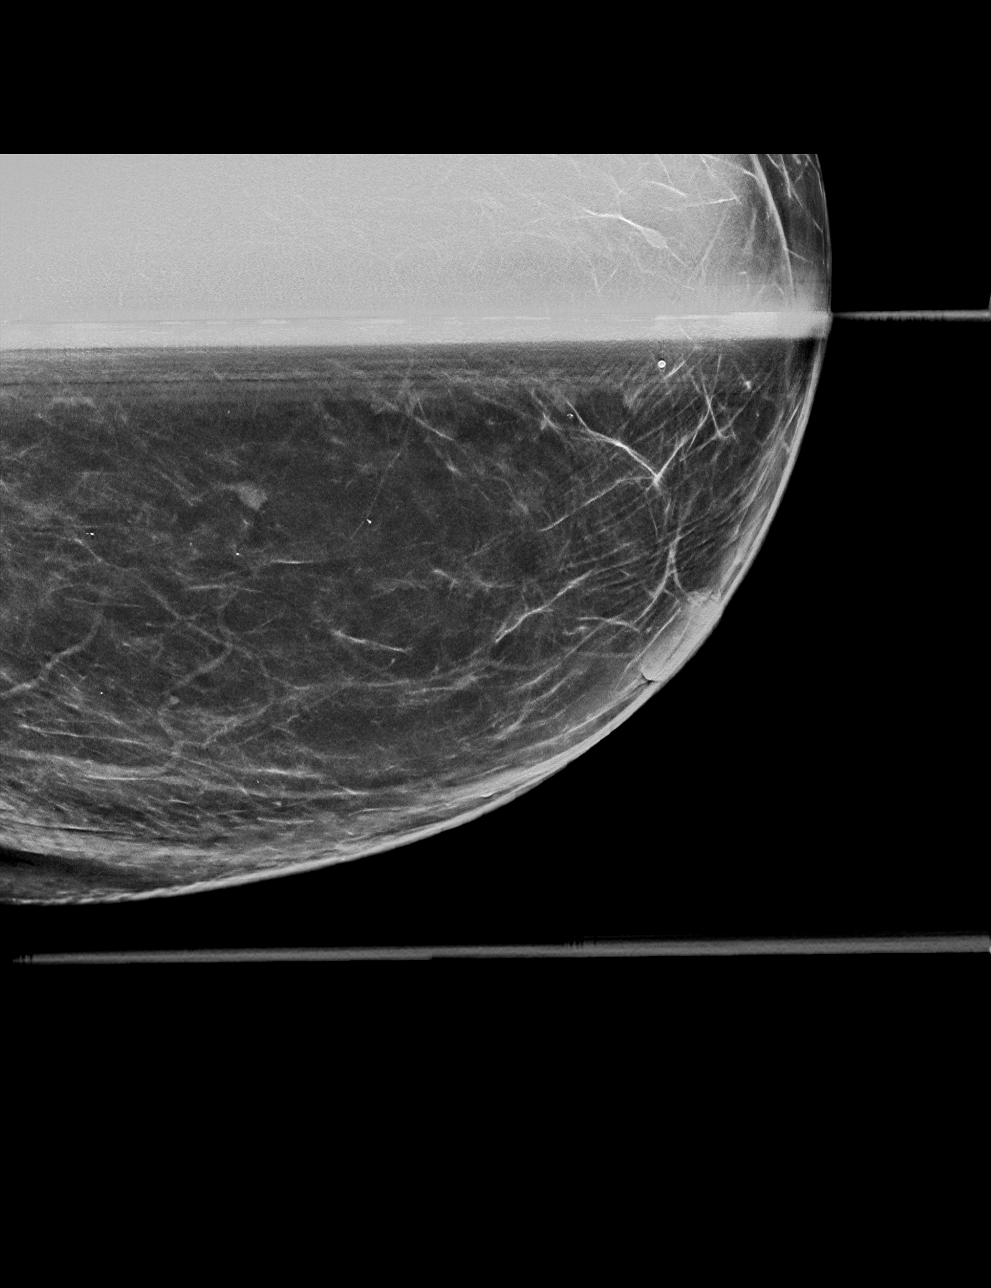

[L CC synth-2D]
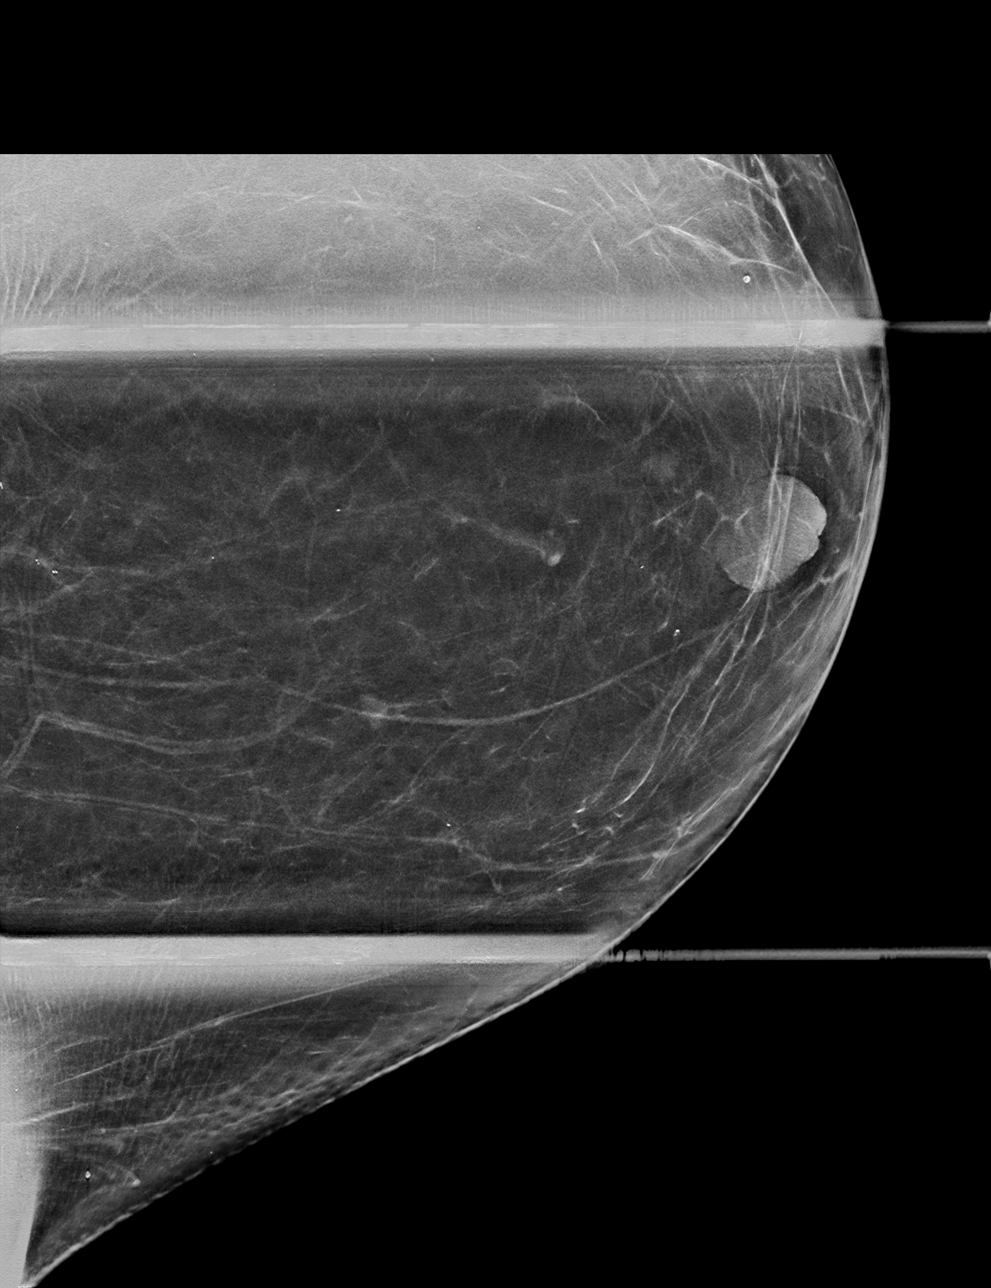

[L MLO tomo · tomo slice 37/74.0]
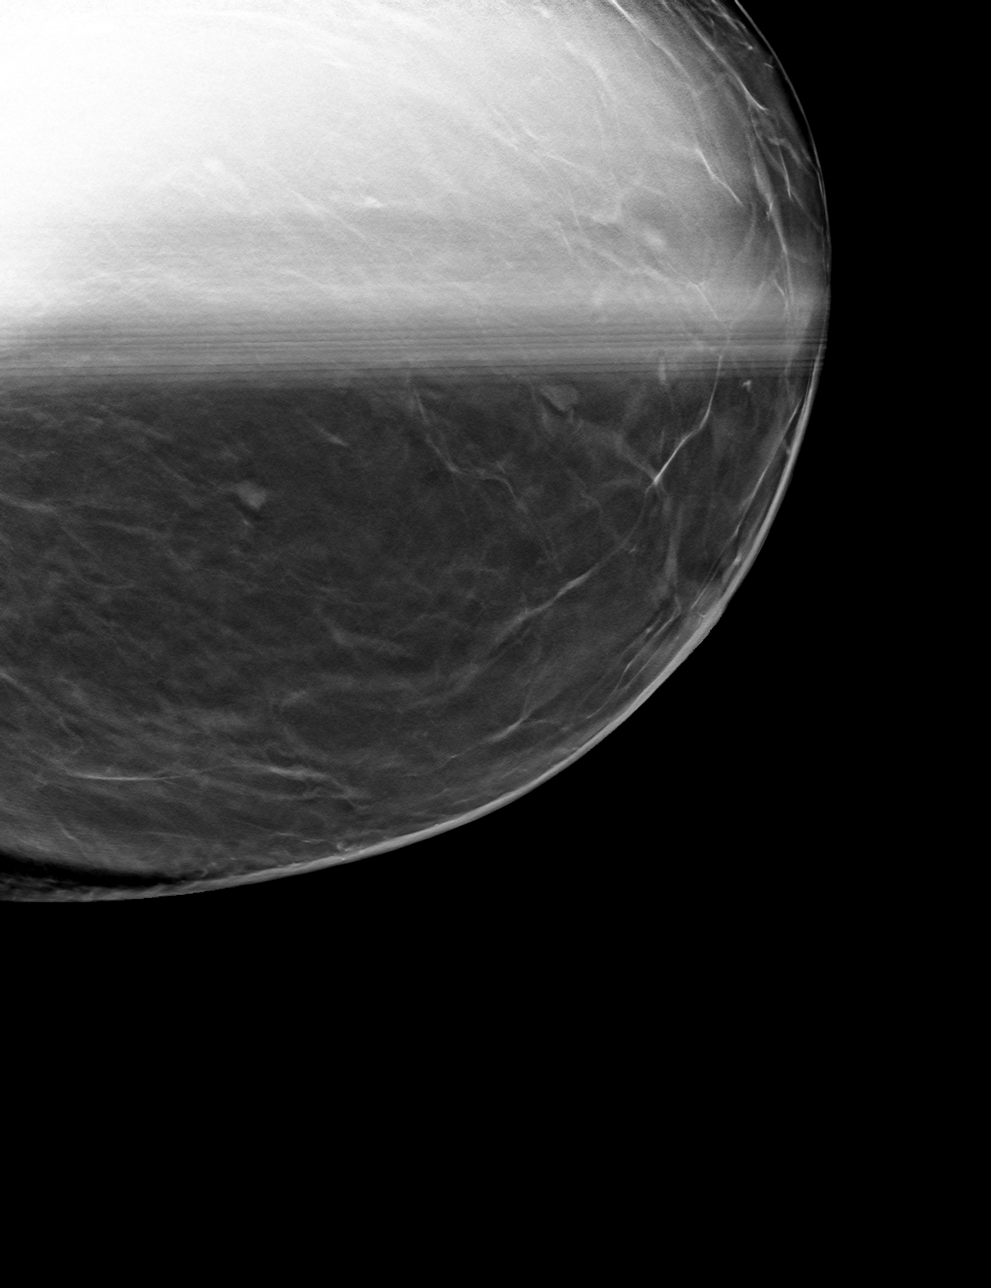

[L CC tomo · tomo slice 36/71.0]
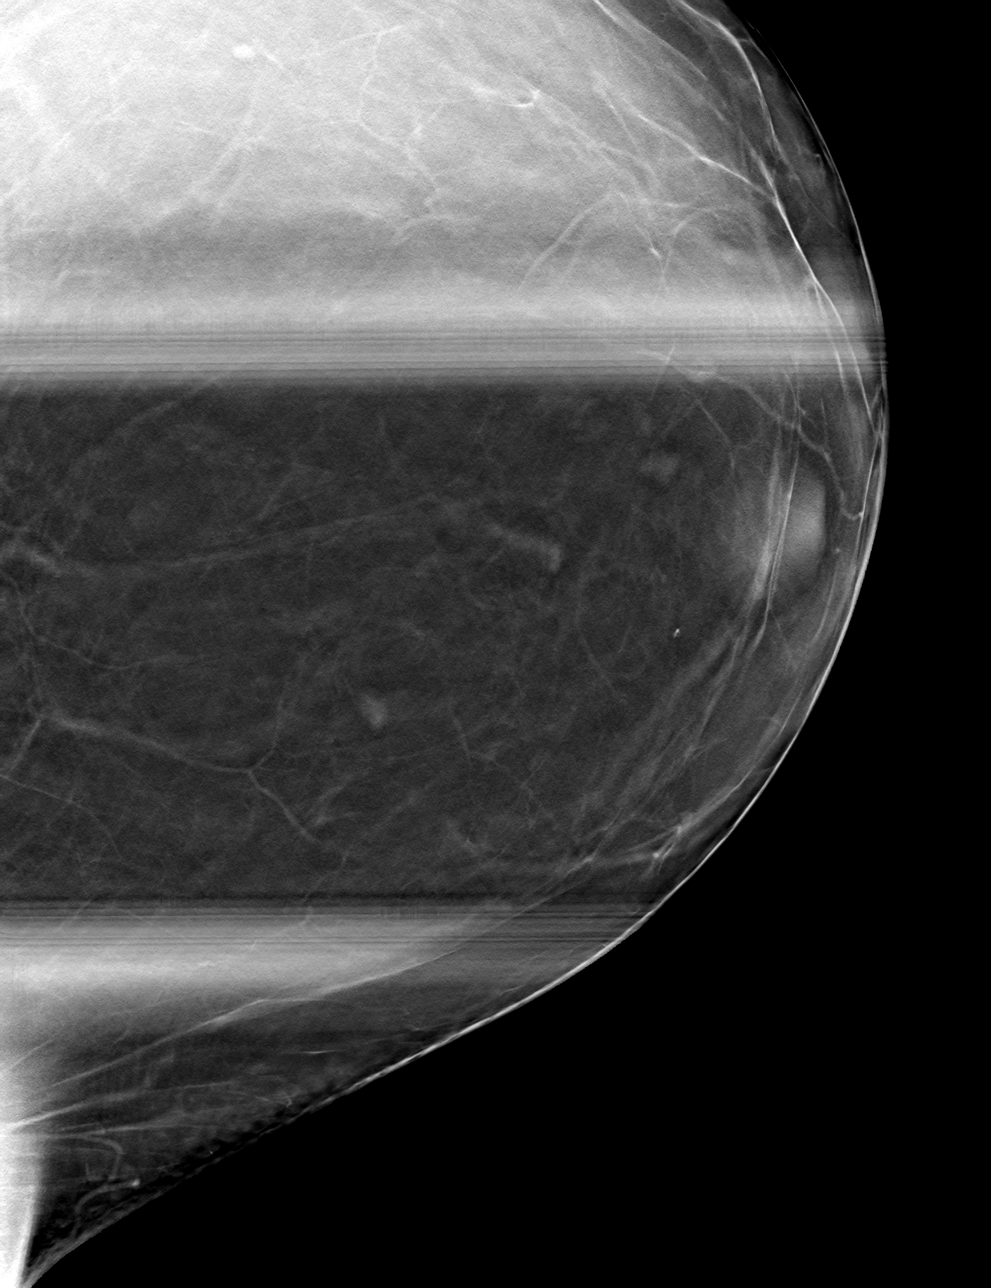

[4 of 12 positions shown; findings below may reference images not displayed]

FINDINGS: The possible mass noted in the medial left breast persists on the
spot compression diagnostic images. It appears as a small oval
circumscribed mass measuring 5 mm, projecting at approximately 9
o'clock.

Targeted ultrasound is performed, showing a small cyst, with a few
thin internal septations, but no other complicating features, at
9:30 o'clock, 7 cm from the nipple, middle depth, measuring 5 x 4 x
4 mm, consistent in size, shape and location to the mammographic
mass. There are no solid masses or suspicious lesions.
IMPRESSION: 1. No evidence of breast malignancy.
2. Benign left breast cyst.

RECOMMENDATION:
Screening mammogram in one year.(Code:[id number]-[id number])

I have discussed the findings and recommendations with the patient.
If applicable, a reminder letter will be sent to the patient
regarding the next appointment.

BI-RADS CATEGORY  2: Benign.

## 2022-10-13 ENCOUNTER — Ambulatory Visit: Payer: 59 | Admitting: Cardiology

## 2022-10-13 ENCOUNTER — Other Ambulatory Visit: Payer: Self-pay | Admitting: Cardiology

## 2022-10-13 ENCOUNTER — Encounter: Payer: Self-pay | Admitting: Cardiology

## 2022-10-13 VITALS — BP 138/82 | HR 88 | Ht 62.0 in | Wt 287.0 lb

## 2022-10-13 DIAGNOSIS — E1169 Type 2 diabetes mellitus with other specified complication: Secondary | ICD-10-CM | POA: Diagnosis not present

## 2022-10-13 DIAGNOSIS — R0602 Shortness of breath: Secondary | ICD-10-CM

## 2022-10-13 DIAGNOSIS — I10 Essential (primary) hypertension: Secondary | ICD-10-CM | POA: Diagnosis not present

## 2022-10-13 DIAGNOSIS — E119 Type 2 diabetes mellitus without complications: Secondary | ICD-10-CM

## 2022-10-13 DIAGNOSIS — Z6841 Body Mass Index (BMI) 40.0 and over, adult: Secondary | ICD-10-CM

## 2022-10-13 DIAGNOSIS — E785 Hyperlipidemia, unspecified: Secondary | ICD-10-CM | POA: Diagnosis not present

## 2022-10-13 MED ORDER — LOSARTAN POTASSIUM 25 MG PO TABS
25.0000 mg | ORAL_TABLET | Freq: Every day | ORAL | 0 refills | Status: DC
Start: 2022-10-13 — End: 2022-10-13

## 2022-10-13 NOTE — Patient Instructions (Signed)
Stop torsemide.  Start losartan 25 mg p.o. every afternoon  Labs in 1 week at [facility] to evaluate kidney function and potassium levels  As long as the labs and blood pressures improve we will refill losartan for longer duration.

## 2022-10-13 NOTE — Progress Notes (Signed)
Date:  10/13/2022   ID:  Jamie Salazar, DOB May 21, 1960, MRN [id number]  PCP:  Etta Grandchild, MD  Cardiologist:  Tessa Lerner, DO, [facility] (established care 03/08/21)  Date: 10/13/22 Last Office Visit: 10/12/2021  Chief Complaint  Patient presents with   Shortness of Breath   Follow-up    HPI  Jamie Salazar is a 62 y.o. African-American female whose past medical history and cardiovascular risk factors include: NIDDM, HTN, Hyperlipidemia, obesity due to excess calories.   She presents today for 1 year follow-up visit for reevaluation of dyspnea.  Over the last 1 year she is doing well from a cardiovascular standpoint.  She denies any anginal chest pain and shortness of breath has significantly improved/resolved.  Patient states that she used to weigh 353 pounds and now she is down to 287.  She has been very mindful of what she eats on a regular basis, tries to be active is much as possible, and currently on Mounjaro.  In the past her coronary calcium score noted noncardiac findings which she has discussed with PCP.  Outside labs independently reviewed and noted below.  She does not check her blood pressures at home.  FUNCTIONAL STATUS: No structured exercise program or daily routine.   ALLERGIES: Allergies  Allergen Reactions   Lisinopril Cough   Ciprofloxacin Hcl     Hallucination     MEDICATION LIST PRIOR TO VISIT: Current Meds  Medication Sig   allopurinol (ZYLOPRIM) 100 MG tablet Take 1 tablet (100 mg total) by mouth daily.   colchicine 0.6 MG tablet Take 1.2mg  (2 tablets) then 0.6mg  (1 tablet) 1 hour after. Then, take 1 tablet every day for 7 days.   Finerenone (KERENDIA) 20 MG TABS Take 1 tablet (20 mg total) by mouth daily.   levocetirizine (XYZAL) 5 MG tablet Take one tablet by mouth daily. (Patient taking differently: Take 5 mg by mouth 2 (two) times daily. Take one tablet by mouth daily.)   losartan (COZAAR) 25 MG tablet Take 1 tablet (25 mg total) by  mouth daily at 10 pm.   metoprolol succinate (TOPROL-XL) 25 MG 24 hr tablet TAKE 1 TABLET BY MOUTH EVERY MORNING, HOLD IF SYSTOLIC(TOP NUMBER) LESS THANT 100 OR PULSE LESS THAN 60   MOUNJARO 10 MG/0.5ML Pen ADMINISTER 10 MG UNDER THE SKIN 1 TIME A WEEK   rosuvastatin (CRESTOR) 5 MG tablet TAKE 1 TABLET(5 MG) BY MOUTH DAILY   [DISCONTINUED] torsemide (DEMADEX) 20 MG tablet TAKE 1 TABLET(20 MG) BY MOUTH DAILY (Patient taking differently: Take 10 mg by mouth 3 (three) times a week. TAKE 1 TABLET(20 MG) BY MOUTH DAILY)     PAST MEDICAL HISTORY: Past Medical History:  Diagnosis Date   Allergy    Zyrtec daily.   Anxiety    Arthritis    disability   Depression    Diabetes mellitus without complication (HCC)    Hyperlipidemia    Hypertension    Obesity, morbid (HCC)     PAST SURGICAL HISTORY: Past Surgical History:  Procedure Laterality Date   CHOLECYSTECTOMY     gallbladder      FAMILY HISTORY: The patient family history includes Diabetes in her mother; Heart disease in her mother; Hypertension in her mother; Lupus in her mother; Neuropathy in her mother.  SOCIAL HISTORY:  The patient  reports that she has never smoked. Her smokeless tobacco use includes snuff. She reports that she does not drink alcohol and does not use drugs.  REVIEW OF SYSTEMS:  Review of Systems  Constitutional: Positive for weight loss.  Cardiovascular:  Negative for chest pain, claudication, dyspnea on exertion, irregular heartbeat, leg swelling, near-syncope, orthopnea, palpitations, paroxysmal nocturnal dyspnea and syncope.  Respiratory:  Negative for shortness of breath.   Hematologic/Lymphatic: Negative for bleeding problem.  Musculoskeletal:  Negative for muscle cramps and myalgias.  Neurological:  Negative for dizziness and light-headedness.    PHYSICAL EXAM:    10/13/2022   10:44 AM 10/13/2022    9:55 AM 08/01/2022    8:52 AM  Vitals with BMI  Height  5\' 2"  5\' 2"   Weight  287 lbs 289 lbs  BMI   52.48 52.85  Systolic 138 158 161  Diastolic 82 97 86  Pulse 88 90 90    Physical Exam  Constitutional: No distress.  Age appropriate, hemodynamically stable.   Neck: No JVD present.  Cardiovascular: Normal rate, regular rhythm, S1 normal, S2 normal, intact distal pulses and normal pulses. Exam reveals no gallop, no S3 and no S4.  No murmur heard. Pulmonary/Chest: Effort normal and breath sounds normal. No stridor. She has no wheezes. She has no rales.  Abdominal: Soft. Bowel sounds are normal. She exhibits no distension. There is no abdominal tenderness.  Abdominal obesity  Musculoskeletal:        General: No edema.     Cervical back: Neck supple.  Neurological: She is alert and oriented to person, place, and time. She has intact cranial nerves (2-12).  Skin: Skin is warm and moist.     CARDIAC DATABASE: EKG: October 13, 2022: Sinus tachycardia, 107 bpm, without underlying ischemia or injury pattern.  Echocardiogram: 05/16/2021: Left ventricle cavity is normal in size. Mild concentric hypertrophy of the left ventricle. Normal global wall motion. Normal LV systolic function with EF 56%. Indeterminate diastolic filling pattern.  No significant valvular abnormality. Normal right atrial pressure.    Stress Testing: No results found for this or any previous visit from the past 1095 days.  Heart Catheterization: None  Coronary calcium score report 03/31/2021 No visible coronary artery calcifications. Total coronary calcium score of 0.   Left pleural lipoma.   Scattered low-density lesions in the liver, difficult to characterize. Recommend further evaluation with right upper quadrant ultrasound or contrast-enhanced abdominal CT.  14 day extended Holter monitor: Dominant rhythm normal sinus. Heart rate 56-169 bpm. Avg HR 84 bpm. No atrial fibrillation, ventricular tachycardia, high grade AV block, pauses (3 seconds or longer). Total ventricular ectopic burden <1%. Total  supraventricular ectopic burden <1%. Rare episodes of supraventricular tachycardia.  Patient triggered events: 0.   LABORATORY DATA:    Latest Ref Rng & Units 08/01/2022    9:51 AM 01/04/2022   10:30 AM 03/31/2021   10:49 AM  CBC  WBC 4.0 - 10.5 K/uL 7.7  8.5  9.1   Hemoglobin 12.0 - 15.0 g/dL 09.6  04.5  40.9   Hematocrit 36.0 - 46.0 % 42.2  42.2  40.1   Platelets 150.0 - 400.0 K/uL 220.0  210.0  253.0        Latest Ref Rng & Units 08/01/2022    9:51 AM 01/04/2022   10:30 AM 06/29/2021   11:19 AM  CMP  Glucose 70 - 99 mg/dL 81  88  99   BUN 6 - 23 mg/dL 14  19  12    Creatinine 0.40 - 1.20 mg/dL 8.11  9.14  7.82   Sodium 135 - 145 mEq/L 139  137  138   Potassium 3.5 - 5.1  mEq/L 3.5  4.1  3.9   Chloride 96 - 112 mEq/L 103  103  102   CO2 19 - 32 mEq/L 26  26  28    Calcium 8.4 - 10.5 mg/dL 9.5  94.8  54.6   Total Protein 6.0 - 8.3 g/dL 8.3     Total Bilirubin 0.2 - 1.2 mg/dL 0.5     Alkaline Phos 39 - 117 U/L 99     AST 0 - 37 U/L 21     ALT 0 - 35 U/L 15       Lipid Panel     Component Value Date/Time   CHOL 119 08/01/2022 0951   TRIG 61.0 08/01/2022 0951   HDL 57.60 08/01/2022 0951   CHOLHDL 2 08/01/2022 0951   VLDL 12.2 08/01/2022 0951   LDLCALC 50 08/01/2022 0951    No components found for: "NTPROBNP" No results for input(s): "PROBNP" in the last 8760 hours.  Recent Labs    08/01/22 0951  TSH 4.42    BMP Recent Labs    01/04/22 1030 08/01/22 0951  NA 137 139  K 4.1 3.5  CL 103 103  CO2 26 26  GLUCOSE 88 81  BUN 19 14  CREATININE 1.12 0.92  CALCIUM 10.1 9.5    HEMOGLOBIN A1C Lab Results  Component Value Date   HGBA1C 5.5 08/01/2022   MPG 148 02/02/2017    IMPRESSION:    ICD-10-CM   1. Shortness of breath  R06.02 EKG 12-Lead    2. Benign hypertension  I10 losartan (COZAAR) 25 MG tablet    Basic metabolic panel    3. Non-insulin dependent type 2 diabetes mellitus (HCC)  E11.9 losartan (COZAAR) 25 MG tablet    Basic metabolic panel    4.  Type 2 diabetes mellitus with hyperlipidemia (HCC)  E11.69    E78.5     5. Class 3 severe obesity due to excess calories with serious comorbidity and body mass index (BMI) of 50.0 to 59.9 in adult [facility])  E66.01    Z68.43        RECOMMENDATIONS: MIKHIA DUSEK is a 62 y.o. African-American female whose past medical history and cardiac risk factors include: NIDDM, HTN, Hyperlipidemia, obesity due to excess calories.   Shortness of breath Resolved since last office visit. Likely secondary to weight loss. Reemphasized the importance of blood pressure management and to continue with her weight loss journey. EKG shows sinus tachycardia without underlying ischemia or injury pattern. Echo: Preserved LVEF without any significant valvular heart disease as of January 2023.  Benign hypertension Initial blood pressures were not well-controlled, better on recheck. Given her diabetes would recommend a goal SBP 130 mmHg. Discontinue torsemide as there is no cardiovascular benefits. Start losartan 25 mg p.o. every afternoon. Labs in 1 week. I have asked her to keep a log of her blood pressures and either see myself or PCP sooner if she is not at goal. Re emphasized the importance of low-salt diet and increasing physical activity as tolerated.  Non-insulin dependent type 2 diabetes mellitus (HCC) Type 2 diabetes mellitus with hyperlipidemia (HCC) Reemphasized importance of glycemic control. Started on ARB. Currently on low-dose statin therapy, Chauncey Mann.   Class 3 severe obesity due to excess calories with serious comorbidity and body mass index (BMI) of 50.0 to 59.9 in adult [facility]) Body mass index is 52.49 kg/m. I reviewed with her importance of diet, regular physical activity/exercise, weight loss.   Patient is educated on the importance of increasing  physical activity gradually as tolerated with a goal of moderate intensity exercise for 30 minutes a day 5 days a week.  FINAL MEDICATION LIST  END OF ENCOUNTER: Meds ordered this encounter  Medications   losartan (COZAAR) 25 MG tablet    Sig: Take 1 tablet (25 mg total) by mouth daily at 10 pm.    Dispense:  30 tablet    Refill:  0     Medications Discontinued During This Encounter  Medication Reason   esomeprazole (NEXIUM) 40 MG packet    torsemide (DEMADEX) 20 MG tablet      Current Outpatient Medications:    allopurinol (ZYLOPRIM) 100 MG tablet, Take 1 tablet (100 mg total) by mouth daily., Disp: 90 tablet, Rfl: 1   colchicine 0.6 MG tablet, Take 1.2mg  (2 tablets) then 0.6mg  (1 tablet) 1 hour after. Then, take 1 tablet every day for 7 days., Disp: 10 tablet, Rfl: 2   Finerenone (KERENDIA) 20 MG TABS, Take 1 tablet (20 mg total) by mouth daily., Disp: 90 tablet, Rfl: 1   levocetirizine (XYZAL) 5 MG tablet, Take one tablet by mouth daily. (Patient taking differently: Take 5 mg by mouth 2 (two) times daily. Take one tablet by mouth daily.), Disp: 30 tablet, Rfl: 5   losartan (COZAAR) 25 MG tablet, Take 1 tablet (25 mg total) by mouth daily at 10 pm., Disp: 30 tablet, Rfl: 0   metoprolol succinate (TOPROL-XL) 25 MG 24 hr tablet, TAKE 1 TABLET BY MOUTH EVERY MORNING, HOLD IF SYSTOLIC(TOP NUMBER) LESS THANT 100 OR PULSE LESS THAN 60, Disp: 90 tablet, Rfl: 1   MOUNJARO 10 MG/0.5ML Pen, ADMINISTER 10 MG UNDER THE SKIN 1 TIME A WEEK, Disp: 6 mL, Rfl: 0   rosuvastatin (CRESTOR) 5 MG tablet, TAKE 1 TABLET(5 MG) BY MOUTH DAILY, Disp: 90 tablet, Rfl: 1  Orders Placed This Encounter  Procedures   Basic metabolic panel   EKG 12-Lead   Patient Instructions  Stop torsemide.  Start losartan 25 mg p.o. every afternoon  Labs in 1 week at [facility] to evaluate kidney function and potassium levels  As long as the labs and blood pressures improve we will refill losartan for longer duration.   --Continue cardiac medications as reconciled in final medication list. --Return in about 1 year (around 10/13/2023) for Annual follow up visit. Or  sooner if needed. --Continue follow-up with your primary care physician regarding the management of your other chronic comorbid conditions.  Patient's questions and concerns were addressed to her satisfaction. She voices understanding of the instructions provided during this encounter.   This note was created using a voice recognition software as a result there may be grammatical errors inadvertently enclosed that do not reflect the nature of this encounter. Every attempt is made to correct such errors.  Tessa Lerner, [place], [facility]  Pager:  [phone] Office: [phone]

## 2022-10-17 ENCOUNTER — Other Ambulatory Visit: Payer: Self-pay

## 2022-10-17 DIAGNOSIS — I10 Essential (primary) hypertension: Secondary | ICD-10-CM

## 2022-10-17 DIAGNOSIS — E119 Type 2 diabetes mellitus without complications: Secondary | ICD-10-CM

## 2022-10-20 ENCOUNTER — Encounter: Payer: Self-pay | Admitting: Podiatry

## 2022-10-20 ENCOUNTER — Ambulatory Visit (INDEPENDENT_AMBULATORY_CARE_PROVIDER_SITE_OTHER): Payer: 59 | Admitting: Podiatry

## 2022-10-20 VITALS — BP 151/90

## 2022-10-20 DIAGNOSIS — B351 Tinea unguium: Secondary | ICD-10-CM | POA: Diagnosis not present

## 2022-10-20 DIAGNOSIS — E119 Type 2 diabetes mellitus without complications: Secondary | ICD-10-CM | POA: Diagnosis not present

## 2022-10-20 DIAGNOSIS — I10 Essential (primary) hypertension: Secondary | ICD-10-CM | POA: Diagnosis not present

## 2022-10-20 DIAGNOSIS — M79675 Pain in left toe(s): Secondary | ICD-10-CM | POA: Diagnosis not present

## 2022-10-20 DIAGNOSIS — M79674 Pain in right toe(s): Secondary | ICD-10-CM | POA: Diagnosis not present

## 2022-10-20 DIAGNOSIS — E1142 Type 2 diabetes mellitus with diabetic polyneuropathy: Secondary | ICD-10-CM

## 2022-10-20 NOTE — Progress Notes (Signed)
This patient returns to my office for at risk foot care.  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  This patient is unable to cut nails herself since the patient cannot reach her nails.These nails are painful walking and wearing shoes.  This patient presents for at risk foot care today.  General Appearance  Alert, conversant and in no acute stress.  Vascular  Dorsalis pedis and posterior tibial  pulses are palpable  bilaterally.  Capillary return is within normal limits  bilaterally. Temperature is within normal limits  bilaterally.  Neurologic  Senn-Weinstein monofilament wire test within normal limits  bilaterally. Muscle power within normal limits bilaterally.  Nails Thick disfigured discolored nails with subungual debris  from hallux to fifth toes bilaterally. No evidence of bacterial infection or drainage bilaterally.  Orthopedic  No limitations of motion  feet .  No crepitus or effusions noted.  No bony pathology or digital deformities noted.  Skin  normotropic skin with no porokeratosis noted bilaterally.  No signs of infections or ulcers noted.     Onychomycosis  Pain in right toes  Pain in left toes  Consent was obtained for treatment procedures.   Mechanical debridement of nails 1-5  bilaterally performed with a nail nipper.  Filed with dremel without incident.    Return office visit       4    months                  Told patient to return for periodic foot care and evaluation due to potential at risk complications.   Cruze Zingaro DPM   

## 2022-10-21 LAB — BASIC METABOLIC PANEL
BUN/Creatinine Ratio: 15 (ref 12–28)
BUN: 17 mg/dL (ref 8–27)
CO2: 22 mmol/L (ref 20–29)
Calcium: 9.5 mg/dL (ref 8.7–10.3)
Chloride: 102 mmol/L (ref 96–106)
Creatinine, Ser: 1.16 mg/dL — ABNORMAL HIGH (ref 0.57–1.00)
Glucose: 88 mg/dL (ref 70–99)
Potassium: 4.3 mmol/L (ref 3.5–5.2)
Sodium: 137 mmol/L (ref 134–144)
eGFR: 53 mL/min/{1.73_m2} — ABNORMAL LOW (ref >59)

## 2022-11-02 NOTE — Progress Notes (Signed)
Pt is aware of lab results, pt would like to know if she can take torsemide 10 mg as needed

## 2023-01-21 ENCOUNTER — Other Ambulatory Visit: Payer: Self-pay | Admitting: Internal Medicine

## 2023-01-21 DIAGNOSIS — E1121 Type 2 diabetes mellitus with diabetic nephropathy: Secondary | ICD-10-CM

## 2023-01-21 DIAGNOSIS — E785 Hyperlipidemia, unspecified: Secondary | ICD-10-CM

## 2023-01-21 DIAGNOSIS — E118 Type 2 diabetes mellitus with unspecified complications: Secondary | ICD-10-CM

## 2023-01-22 ENCOUNTER — Telehealth: Payer: Self-pay | Admitting: Internal Medicine

## 2023-01-22 ENCOUNTER — Other Ambulatory Visit: Payer: Self-pay | Admitting: Internal Medicine

## 2023-01-22 DIAGNOSIS — E118 Type 2 diabetes mellitus with unspecified complications: Secondary | ICD-10-CM

## 2023-01-22 MED ORDER — MOUNJARO 10 MG/0.5ML ~~LOC~~ SOAJ: 10.0000 mg | SUBCUTANEOUS | 0 refills | Status: DC

## 2023-01-22 NOTE — Telephone Encounter (Signed)
Patient's MOUNJARO 10 MG/0.5ML PenU was denied due to her needing an appointment. She has an appointment scheduled for 01/31/2023 and would like to know if it can be sent in for her.  Patient would like a call back at [phone].

## 2023-01-26 ENCOUNTER — Other Ambulatory Visit: Payer: Self-pay | Admitting: Allergy

## 2023-01-26 ENCOUNTER — Telehealth: Payer: Self-pay | Admitting: Internal Medicine

## 2023-01-26 ENCOUNTER — Other Ambulatory Visit: Payer: Self-pay | Admitting: Internal Medicine

## 2023-01-26 DIAGNOSIS — E1121 Type 2 diabetes mellitus with diabetic nephropathy: Secondary | ICD-10-CM

## 2023-01-26 DIAGNOSIS — E785 Hyperlipidemia, unspecified: Secondary | ICD-10-CM

## 2023-01-26 DIAGNOSIS — E118 Type 2 diabetes mellitus with unspecified complications: Secondary | ICD-10-CM

## 2023-01-26 MED ORDER — KERENDIA 20 MG PO TABS
1.0000 | ORAL_TABLET | Freq: Every day | ORAL | 0 refills | Status: DC
Start: 2023-01-26 — End: 2023-04-17

## 2023-01-26 MED ORDER — ROSUVASTATIN CALCIUM 5 MG PO TABS: 5.0000 mg | ORAL_TABLET | Freq: Every day | ORAL | 0 refills | Status: DC

## 2023-01-26 NOTE — Telephone Encounter (Signed)
Prescription Request  01/26/2023  LOV: 08/01/2022  What is the name of the medication or equipment? Leonides Grills and Rosuvastatin  Have you contacted your pharmacy to request a refill? Yes   Which pharmacy would you like this sent to?  [id number] - [place], [place] - [address] [place] [address] Phone: [phone] Fax: [phone]    Patient notified that their request is being sent to the clinical staff for review and that they should receive a response within 2 business days.   Please advise at Mobile [phone] (mobile)

## 2023-01-31 ENCOUNTER — Encounter: Payer: Self-pay | Admitting: Internal Medicine

## 2023-01-31 ENCOUNTER — Ambulatory Visit: Payer: 59 | Admitting: Internal Medicine

## 2023-01-31 VITALS — BP 126/84 | HR 84 | Temp 97.6°F | Resp 16 | Ht 62.0 in | Wt 290.0 lb

## 2023-01-31 DIAGNOSIS — E213 Hyperparathyroidism, unspecified: Secondary | ICD-10-CM | POA: Diagnosis not present

## 2023-01-31 DIAGNOSIS — E118 Type 2 diabetes mellitus with unspecified complications: Secondary | ICD-10-CM | POA: Diagnosis not present

## 2023-01-31 DIAGNOSIS — Z23 Encounter for immunization: Secondary | ICD-10-CM

## 2023-01-31 DIAGNOSIS — Z1211 Encounter for screening for malignant neoplasm of colon: Secondary | ICD-10-CM

## 2023-01-31 DIAGNOSIS — I10 Essential (primary) hypertension: Secondary | ICD-10-CM

## 2023-01-31 DIAGNOSIS — Z124 Encounter for screening for malignant neoplasm of cervix: Secondary | ICD-10-CM

## 2023-01-31 LAB — BASIC METABOLIC PANEL
BUN: 14 mg/dL (ref 6–23)
CO2: 27 meq/L (ref 19–32)
Calcium: 9.8 mg/dL (ref 8.4–10.5)
Chloride: 105 meq/L (ref 96–112)
Creatinine, Ser: 0.98 mg/dL (ref 0.40–1.20)
GFR: 61.38 mL/min (ref >60.00)
Glucose, Bld: 90 mg/dL (ref 70–99)
Potassium: 3.7 meq/L (ref 3.5–5.1)
Sodium: 139 meq/L (ref 135–145)

## 2023-01-31 LAB — URINALYSIS, ROUTINE W REFLEX MICROSCOPIC
Hgb urine dipstick: NEGATIVE
Leukocytes,Ua: NEGATIVE
Nitrite: NEGATIVE
RBC / HPF: NONE SEEN (ref 0–?)
Specific Gravity, Urine: >=1.03 — AB (ref 1.000–1.030)
Urine Glucose: NEGATIVE
Urobilinogen, UA: 1 (ref 0.0–1.0)
pH: 6 (ref 5.0–8.0)

## 2023-01-31 LAB — HEMOGLOBIN A1C: Hgb A1c MFr Bld: 5.4 % (ref 4.6–6.5)

## 2023-01-31 LAB — MICROALBUMIN / CREATININE URINE RATIO
Creatinine,U: 326 mg/dL
Microalb Creat Ratio: 1.7 mg/g (ref 0.0–30.0)
Microalb, Ur: 5.7 mg/dL — ABNORMAL HIGH (ref 0.0–1.9)

## 2023-01-31 MED ORDER — SHINGRIX 50 MCG/0.5ML IM SUSR: 0.5000 mL | Freq: Once | INTRAMUSCULAR | 1 refills | Status: DC

## 2023-01-31 NOTE — Patient Instructions (Signed)

## 2023-01-31 NOTE — Progress Notes (Signed)
Subjective:  Patient ID: Jamie Salazar, female    DOB: 08-25-1960  Age: 62 y.o. MRN: [id number]  CC: Hypertension and Diabetes   HPI SHEYDA WELFORD presents for f/up -----  Discussed the use of AI scribe software for clinical note transcription with the patient, who gave verbal consent to proceed.  History of Present Illness   The patient, with a history of cardiovascular disease and gout, reports no recent chest pain, shortness of breath, or dizziness. They have been adhering to their medication regimen, which includes losartan, without any noticeable side effects. They have been seen by a cardiologist earlier in the year, during which an EKG was performed. The patient reports that the cardiologist questioned them about potential shortness of breath, but they denied experiencing any noticeable symptoms.   The patient also reports issues with their feet, describing a sensation as if it's "cut in half," but denies it causing any difficulty in walking. They are unsure if this is related to their gout or another condition. They are currently taking allopurinol for their gout.       Outpatient Medications Prior to Visit  Medication Sig Dispense Refill   allopurinol (ZYLOPRIM) 100 MG tablet Take 1 tablet (100 mg total) by mouth daily. 90 tablet 1   colchicine 0.6 MG tablet Take 1.2mg  (2 tablets) then 0.6mg  (1 tablet) 1 hour after. Then, take 1 tablet every day for 7 days. 10 tablet 2   Finerenone (KERENDIA) 20 MG TABS Take 1 tablet (20 mg total) by mouth daily. 90 tablet 0   levocetirizine (XYZAL) 5 MG tablet Take one tablet by mouth daily. (Patient taking differently: Take 5 mg by mouth 2 (two) times daily. Take one tablet by mouth daily.) 30 tablet 5   losartan (COZAAR) 25 MG tablet TAKE 1 TABLET(25 MG) BY MOUTH DAILY AT 10 PM 90 tablet 3   metoprolol succinate (TOPROL-XL) 25 MG 24 hr tablet TAKE 1 TABLET BY MOUTH EVERY MORNING, HOLD IF SYSTOLIC(TOP NUMBER) LESS THANT 100 OR PULSE LESS  THAN 60 90 tablet 1   MOUNJARO 10 MG/0.5ML Pen Inject 10 mg into the skin once a week. 6 mL 0   rosuvastatin (CRESTOR) 5 MG tablet Take 1 tablet (5 mg total) by mouth daily. TAKE 1 TABLET(5 MG) BY MOUTH DAILY 90 tablet 0   No facility-administered medications prior to visit.    ROS Review of Systems  Constitutional: Negative.  Negative for diaphoresis and fatigue.  HENT: Negative.    Eyes: Negative.   Respiratory: Negative.  Negative for cough, chest tightness, shortness of breath and wheezing.   Cardiovascular:  Negative for chest pain, palpitations and leg swelling.  Gastrointestinal:  Negative for abdominal pain, constipation, diarrhea, nausea and vomiting.  Endocrine: Negative.   Genitourinary:  Negative for difficulty urinating.  Musculoskeletal:  Positive for arthralgias. Negative for myalgias.  Skin: Negative.   Neurological:  Negative for dizziness, weakness and light-headedness.  Hematological:  Negative for adenopathy. Does not bruise/bleed easily.  Psychiatric/Behavioral: Negative.      Objective:  BP 126/84 (BP Location: Right Arm, Patient Position: Sitting, Cuff Size: Large)   Pulse 84   Temp 97.6 F (36.4 C) (Oral)   Resp 16   Ht 5\' 2"  (1.575 m)   Wt 290 lb (131.5 kg)   SpO2 99%   BMI 53.04 kg/m   BP Readings from Last 3 Encounters:  01/31/23 126/84  10/20/22 (!) 151/90  10/13/22 138/82    Wt Readings from Last 3 Encounters:  01/31/23 290 lb (131.5 kg)  10/13/22 287 lb (130.2 kg)  08/01/22 289 lb (131.1 kg)    Physical Exam Vitals reviewed.  Constitutional:      Appearance: Normal appearance.  HENT:     Nose: Nose normal.     Mouth/Throat:     Mouth: Mucous membranes are moist.  Eyes:     General: No scleral icterus.    Conjunctiva/sclera: Conjunctivae normal.  Cardiovascular:     Rate and Rhythm: Normal rate and regular rhythm.     Pulses:          Dorsalis pedis pulses are 1+ on the right side and 1+ on the left side.       Posterior  tibial pulses are 1+ on the right side and 1+ on the left side.     Heart sounds: No murmur heard.    No gallop.  Pulmonary:     Effort: Pulmonary effort is normal.     Breath sounds: No stridor. No wheezing, rhonchi or rales.  Abdominal:     General: Abdomen is flat.     Palpations: There is no mass.     Tenderness: There is no abdominal tenderness. There is no guarding.     Hernia: No hernia is present.  Musculoskeletal:        General: Normal range of motion.     Cervical back: Neck supple.     Right lower leg: No edema.     Left lower leg: No edema.  Feet:     Right foot:     Skin integrity: Skin integrity normal.     Left foot:     Skin integrity: Skin integrity normal.  Lymphadenopathy:     Cervical: No cervical adenopathy.  Skin:    General: Skin is warm and dry.     Findings: No lesion.  Neurological:     General: No focal deficit present.     Mental Status: She is alert. Mental status is at baseline.  Psychiatric:        Mood and Affect: Mood normal.        Behavior: Behavior normal.     Lab Results  Component Value Date   WBC 7.7 08/01/2022   HGB 13.7 08/01/2022   HCT 42.2 08/01/2022   PLT 220.0 08/01/2022   GLUCOSE 90 01/31/2023   CHOL 119 08/01/2022   TRIG 61.0 08/01/2022   HDL 57.60 08/01/2022   LDLCALC 50 08/01/2022   ALT 15 08/01/2022   AST 21 08/01/2022   NA 139 01/31/2023   K 3.7 01/31/2023   CL 105 01/31/2023   CREATININE 0.98 01/31/2023   BUN 14 01/31/2023   CO2 27 01/31/2023   TSH 4.42 08/01/2022   HGBA1C 5.4 01/31/2023   MICROALBUR 5.7 (H) 01/31/2023    MM 3D SCREENING MAMMOGRAM BILATERAL BREAST  Result Date: 09/05/2022 CLINICAL DATA:  Screening. EXAM: DIGITAL SCREENING BILATERAL MAMMOGRAM WITH TOMOSYNTHESIS AND CAD TECHNIQUE: Bilateral screening digital craniocaudal and mediolateral oblique mammograms were obtained. Bilateral screening digital breast tomosynthesis was performed. The images were evaluated with computer-aided detection.  COMPARISON:  Previous exam(s). ACR Breast Density Category a: The breasts are almost entirely fatty. FINDINGS: There are no findings suspicious for malignancy. IMPRESSION: No mammographic evidence of malignancy. A result letter of this screening mammogram will be mailed directly to the patient. RECOMMENDATION: Screening mammogram in one year. (Code:SM-B-01Y) BI-RADS CATEGORY  1: Negative. Electronically Signed   By: Emmaline Kluver M.D.   On: 09/05/2022 10:41  Assessment & Plan:  Need for vaccination  Essential hypertension - Her BP is well controlled. -     Basic metabolic panel; Future -     Urinalysis, Routine w reflex microscopic; Future  Flu vaccine need -     Flu vaccine trivalent PF, 6mos and older(Flulaval,Afluria,Fluarix,Fluzone)  Screening for colon cancer -     Ambulatory referral to Gastroenterology  Cervical cancer screening -     Ambulatory referral to Gynecology  Mild hyperparathyroidism (HCC) - Her calcium is normal.  Type II diabetes mellitus with manifestations (HCC) -     Basic metabolic panel; Future -     Hemoglobin A1c; Future -     Urinalysis, Routine w reflex microscopic; Future -     Microalbumin / creatinine urine ratio; Future  Other orders -     Shingrix; Inject 0.5 mLs into the muscle once for 1 dose.  Dispense: 0.5 mL; Refill: 1     Follow-up: Return in about 6 months (around 08/01/2023).  Sanda Linger, MD

## 2023-02-02 DIAGNOSIS — Z1211 Encounter for screening for malignant neoplasm of colon: Secondary | ICD-10-CM | POA: Insufficient documentation

## 2023-02-02 DIAGNOSIS — Z23 Encounter for immunization: Secondary | ICD-10-CM | POA: Insufficient documentation

## 2023-02-19 ENCOUNTER — Encounter: Payer: Self-pay | Admitting: Podiatry

## 2023-02-19 ENCOUNTER — Ambulatory Visit (INDEPENDENT_AMBULATORY_CARE_PROVIDER_SITE_OTHER): Payer: 59 | Admitting: Podiatry

## 2023-02-19 DIAGNOSIS — M79675 Pain in left toe(s): Secondary | ICD-10-CM

## 2023-02-19 DIAGNOSIS — B351 Tinea unguium: Secondary | ICD-10-CM | POA: Diagnosis not present

## 2023-02-19 DIAGNOSIS — M2042 Other hammer toe(s) (acquired), left foot: Secondary | ICD-10-CM

## 2023-02-19 DIAGNOSIS — M79674 Pain in right toe(s): Secondary | ICD-10-CM | POA: Diagnosis not present

## 2023-02-19 DIAGNOSIS — E1142 Type 2 diabetes mellitus with diabetic polyneuropathy: Secondary | ICD-10-CM

## 2023-02-19 DIAGNOSIS — M2041 Other hammer toe(s) (acquired), right foot: Secondary | ICD-10-CM

## 2023-02-19 NOTE — Progress Notes (Signed)
This patient returns to my office for at risk foot care.  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  This patient is unable to cut nails herself since the patient cannot reach her nails.These nails are painful walking and wearing shoes.  This patient presents for at risk foot care today.  General Appearance  Alert, conversant and in no acute stress.  Vascular  Dorsalis pedis and posterior tibial  pulses are palpable  bilaterally.  Capillary return is within normal limits  bilaterally. Temperature is within normal limits  bilaterally.  Neurologic  Senn-Weinstein monofilament wire test within normal limits  bilaterally. Muscle power within normal limits bilaterally.  Nails Thick disfigured discolored nails with subungual debris  from hallux to fifth toes bilaterally. No evidence of bacterial infection or drainage bilaterally.  Orthopedic  No limitations of motion  feet .  No crepitus or effusions noted.  No bony pathology or digital deformities noted.  Skin  normotropic skin with no porokeratosis noted bilaterally.  No signs of infections or ulcers noted.   Peeling heels  B/L.  Onychomycosis  Pain in right toes  Pain in left toes  Consent was obtained for treatment procedures.   Mechanical debridement of nails 1-5  bilaterally performed with a nail nipper.  Filed with dremel without incident.    Return office visit       4    months                  Told patient to return for periodic foot care and evaluation due to potential at risk complications.   Helane Gunther DPM

## 2023-03-07 DIAGNOSIS — H2512 Age-related nuclear cataract, left eye: Secondary | ICD-10-CM | POA: Diagnosis not present

## 2023-03-25 ENCOUNTER — Other Ambulatory Visit: Payer: Self-pay | Admitting: Cardiology

## 2023-03-25 DIAGNOSIS — R000 Tachycardia, unspecified: Secondary | ICD-10-CM

## 2023-04-15 ENCOUNTER — Other Ambulatory Visit: Payer: Self-pay | Admitting: Internal Medicine

## 2023-04-15 DIAGNOSIS — E118 Type 2 diabetes mellitus with unspecified complications: Secondary | ICD-10-CM

## 2023-04-15 DIAGNOSIS — E1121 Type 2 diabetes mellitus with diabetic nephropathy: Secondary | ICD-10-CM

## 2023-05-02 ENCOUNTER — Other Ambulatory Visit: Payer: Self-pay | Admitting: Internal Medicine

## 2023-05-02 DIAGNOSIS — E785 Hyperlipidemia, unspecified: Secondary | ICD-10-CM

## 2023-06-10 ENCOUNTER — Other Ambulatory Visit: Payer: Self-pay | Admitting: Internal Medicine

## 2023-06-10 DIAGNOSIS — K219 Gastro-esophageal reflux disease without esophagitis: Secondary | ICD-10-CM

## 2023-06-11 ENCOUNTER — Telehealth: Payer: Self-pay | Admitting: Internal Medicine

## 2023-06-11 NOTE — Telephone Encounter (Signed)
 Copied from CRM [id number]. Topic: General - Call Back - No Documentation >> Jun 11, 2023 12:47 PM Florestine Avers wrote: Reason for CRM: Patient is requesting a call back from Dr. Yetta Barre nurse.

## 2023-06-13 NOTE — Telephone Encounter (Signed)
 Unable to reach patient. LMTRC

## 2023-06-22 ENCOUNTER — Other Ambulatory Visit: Payer: Self-pay | Admitting: Internal Medicine

## 2023-06-22 ENCOUNTER — Ambulatory Visit: Payer: 59 | Admitting: Podiatry

## 2023-06-22 DIAGNOSIS — R000 Tachycardia, unspecified: Secondary | ICD-10-CM

## 2023-06-25 ENCOUNTER — Ambulatory Visit: Payer: 59

## 2023-06-25 DIAGNOSIS — M10471 Other secondary gout, right ankle and foot: Secondary | ICD-10-CM

## 2023-06-25 DIAGNOSIS — M2141 Flat foot [pes planus] (acquired), right foot: Secondary | ICD-10-CM

## 2023-06-25 DIAGNOSIS — E1142 Type 2 diabetes mellitus with diabetic polyneuropathy: Secondary | ICD-10-CM

## 2023-06-25 DIAGNOSIS — M2041 Other hammer toe(s) (acquired), right foot: Secondary | ICD-10-CM

## 2023-06-26 NOTE — Progress Notes (Signed)
 Patient presents to the office today for diabetic shoe and insole measuring.  Patient was measured with brannock device to determine size and width for 1 pair of extra depth shoes and foam casted for 3 pair of insoles.   Documentation of medical necessity will be sent to patient's treating diabetic doctor to verify and sign.   Patient's diabetic provider: Sanda Linger MD  Shoes and insoles will be ordered at that time and patient will be notified for an appointment for fitting when they arrive.   Shoe size (per patient): 8.5-9 Brannock measurement: 8.5 Shoe choice:   A330W / A300W Shoe size ordered: 9WD  Ppw / ABN signed

## 2023-07-08 ENCOUNTER — Other Ambulatory Visit: Payer: Self-pay | Admitting: Internal Medicine

## 2023-07-08 DIAGNOSIS — E118 Type 2 diabetes mellitus with unspecified complications: Secondary | ICD-10-CM

## 2023-07-09 ENCOUNTER — Telehealth: Payer: Self-pay

## 2023-07-09 NOTE — Telephone Encounter (Signed)
 Reset patient appt as ppw expires 4/9 appt on 4/3 sent also ordering another sz shoes as she thinks what we originally ordered may be too small

## 2023-07-10 ENCOUNTER — Other Ambulatory Visit: Payer: Self-pay | Admitting: Internal Medicine

## 2023-07-10 DIAGNOSIS — E118 Type 2 diabetes mellitus with unspecified complications: Secondary | ICD-10-CM

## 2023-07-11 ENCOUNTER — Telehealth: Payer: Self-pay | Admitting: Internal Medicine

## 2023-07-11 DIAGNOSIS — E118 Type 2 diabetes mellitus with unspecified complications: Secondary | ICD-10-CM

## 2023-07-11 MED ORDER — MOUNJARO 10 MG/0.5ML ~~LOC~~ SOAJ: SUBCUTANEOUS | 0 refills | Status: DC

## 2023-07-11 NOTE — Telephone Encounter (Signed)
 Copied from CRM [id number]. Topic: Clinical - Prescription Issue >> Jul 11, 2023 10:50 AM Marica Otter wrote: Reason for CRM: Nedra Hai with [facility] called following up on refill request for patients MOUNJARO 10 MG/0.5ML Pen, states they have sent over request and received message stating "sent another means instead of filling refill request". Prescription shows sent and denied on 3/18.  Nedra Hai [phone]

## 2023-07-11 NOTE — Telephone Encounter (Signed)
 Called pharmacy and they state they just need it to be resent, rx resent

## 2023-07-23 ENCOUNTER — Other Ambulatory Visit: Payer: Self-pay | Admitting: Internal Medicine

## 2023-07-23 DIAGNOSIS — E785 Hyperlipidemia, unspecified: Secondary | ICD-10-CM

## 2023-07-23 DIAGNOSIS — E1121 Type 2 diabetes mellitus with diabetic nephropathy: Secondary | ICD-10-CM

## 2023-07-23 DIAGNOSIS — E118 Type 2 diabetes mellitus with unspecified complications: Secondary | ICD-10-CM

## 2023-07-24 ENCOUNTER — Telehealth: Payer: Self-pay

## 2023-07-24 NOTE — Telephone Encounter (Signed)
 Called pt to reschedule left a VM

## 2023-07-25 ENCOUNTER — Ambulatory Visit (INDEPENDENT_AMBULATORY_CARE_PROVIDER_SITE_OTHER)

## 2023-07-25 DIAGNOSIS — M2042 Other hammer toe(s) (acquired), left foot: Secondary | ICD-10-CM

## 2023-07-25 DIAGNOSIS — M2041 Other hammer toe(s) (acquired), right foot: Secondary | ICD-10-CM | POA: Diagnosis not present

## 2023-07-25 DIAGNOSIS — E1142 Type 2 diabetes mellitus with diabetic polyneuropathy: Secondary | ICD-10-CM | POA: Diagnosis not present

## 2023-07-25 DIAGNOSIS — M2142 Flat foot [pes planus] (acquired), left foot: Secondary | ICD-10-CM | POA: Diagnosis not present

## 2023-07-25 DIAGNOSIS — M2141 Flat foot [pes planus] (acquired), right foot: Secondary | ICD-10-CM

## 2023-07-25 NOTE — Progress Notes (Signed)

## 2023-07-26 ENCOUNTER — Other Ambulatory Visit

## 2023-08-01 ENCOUNTER — Ambulatory Visit (INDEPENDENT_AMBULATORY_CARE_PROVIDER_SITE_OTHER): Payer: 59 | Admitting: Internal Medicine

## 2023-08-01 ENCOUNTER — Encounter: Payer: Self-pay | Admitting: Internal Medicine

## 2023-08-01 VITALS — BP 152/96 | HR 85 | Temp 98.1°F | Resp 16 | Ht 62.0 in | Wt 300.4 lb

## 2023-08-01 DIAGNOSIS — E213 Hyperparathyroidism, unspecified: Secondary | ICD-10-CM

## 2023-08-01 DIAGNOSIS — Z23 Encounter for immunization: Secondary | ICD-10-CM

## 2023-08-01 DIAGNOSIS — Z124 Encounter for screening for malignant neoplasm of cervix: Secondary | ICD-10-CM

## 2023-08-01 DIAGNOSIS — E1121 Type 2 diabetes mellitus with diabetic nephropathy: Secondary | ICD-10-CM | POA: Diagnosis not present

## 2023-08-01 DIAGNOSIS — M1A09X0 Idiopathic chronic gout, multiple sites, without tophus (tophi): Secondary | ICD-10-CM

## 2023-08-01 DIAGNOSIS — Z0001 Encounter for general adult medical examination with abnormal findings: Secondary | ICD-10-CM

## 2023-08-01 DIAGNOSIS — E785 Hyperlipidemia, unspecified: Secondary | ICD-10-CM | POA: Diagnosis not present

## 2023-08-01 DIAGNOSIS — Z6841 Body Mass Index (BMI) 40.0 and over, adult: Secondary | ICD-10-CM

## 2023-08-01 DIAGNOSIS — E118 Type 2 diabetes mellitus with unspecified complications: Secondary | ICD-10-CM | POA: Diagnosis not present

## 2023-08-01 DIAGNOSIS — I10 Essential (primary) hypertension: Secondary | ICD-10-CM

## 2023-08-01 DIAGNOSIS — Z0000 Encounter for general adult medical examination without abnormal findings: Secondary | ICD-10-CM | POA: Diagnosis not present

## 2023-08-01 DIAGNOSIS — Z1211 Encounter for screening for malignant neoplasm of colon: Secondary | ICD-10-CM

## 2023-08-01 LAB — BASIC METABOLIC PANEL WITH GFR
BUN: 17 mg/dL (ref 6–23)
CO2: 26 meq/L (ref 19–32)
Calcium: 9.7 mg/dL (ref 8.4–10.5)
Chloride: 105 meq/L (ref 96–112)
Creatinine, Ser: 1 mg/dL (ref 0.40–1.20)
GFR: 59.7 mL/min — ABNORMAL LOW (ref >60.00)
Glucose, Bld: 87 mg/dL (ref 70–99)
Potassium: 3.7 meq/L (ref 3.5–5.1)
Sodium: 140 meq/L (ref 135–145)

## 2023-08-01 LAB — URINALYSIS, ROUTINE W REFLEX MICROSCOPIC
Bilirubin Urine: NEGATIVE
Hgb urine dipstick: NEGATIVE
Ketones, ur: NEGATIVE
Leukocytes,Ua: NEGATIVE
Nitrite: NEGATIVE
RBC / HPF: NONE SEEN (ref 0–?)
Specific Gravity, Urine: 1.025 (ref 1.000–1.030)
Urine Glucose: NEGATIVE
Urobilinogen, UA: 0.2 (ref 0.0–1.0)
pH: 6 (ref 5.0–8.0)

## 2023-08-01 LAB — CBC WITH DIFFERENTIAL/PLATELET
Basophils Absolute: 0 10*3/uL (ref 0.0–0.1)
Basophils Relative: 0.3 % (ref 0.0–3.0)
Eosinophils Absolute: 0.1 10*3/uL (ref 0.0–0.7)
Eosinophils Relative: 1.7 % (ref 0.0–5.0)
HCT: 42.4 % (ref 36.0–46.0)
Hemoglobin: 13.9 g/dL (ref 12.0–15.0)
Lymphocytes Relative: 21.1 % (ref 12.0–46.0)
Lymphs Abs: 1.4 10*3/uL (ref 0.7–4.0)
MCHC: 32.8 g/dL (ref 30.0–36.0)
MCV: 80.2 fl (ref 78.0–100.0)
Monocytes Absolute: 0.3 10*3/uL (ref 0.1–1.0)
Monocytes Relative: 5.3 % (ref 3.0–12.0)
Neutro Abs: 4.6 10*3/uL (ref 1.4–7.7)
Neutrophils Relative %: 71.6 % (ref 43.0–77.0)
Platelets: 185 10*3/uL (ref 150.0–400.0)
RBC: 5.29 Mil/uL — ABNORMAL HIGH (ref 3.87–5.11)
RDW: 14.8 % (ref 11.5–15.5)
WBC: 6.5 10*3/uL (ref 4.0–10.5)

## 2023-08-01 LAB — HEPATIC FUNCTION PANEL
ALT: 15 U/L (ref 0–35)
AST: 17 U/L (ref 0–37)
Albumin: 4.3 g/dL (ref 3.5–5.2)
Alkaline Phosphatase: 95 U/L (ref 39–117)
Bilirubin, Direct: 0.1 mg/dL (ref 0.0–0.3)
Total Bilirubin: 0.6 mg/dL (ref 0.2–1.2)
Total Protein: 8.2 g/dL (ref 6.0–8.3)

## 2023-08-01 LAB — MICROALBUMIN / CREATININE URINE RATIO
Creatinine,U: 184 mg/dL
Microalb Creat Ratio: 26.8 mg/g (ref 0.0–30.0)
Microalb, Ur: 4.9 mg/dL — ABNORMAL HIGH (ref 0.0–1.9)

## 2023-08-01 LAB — LIPID PANEL
Cholesterol: 119 mg/dL (ref 0–200)
HDL: 64.4 mg/dL (ref >39.00)
LDL Cholesterol: 45 mg/dL (ref 0–99)
NonHDL: 54.43
Total CHOL/HDL Ratio: 2
Triglycerides: 49 mg/dL (ref 0.0–149.0)
VLDL: 9.8 mg/dL (ref 0.0–40.0)

## 2023-08-01 LAB — HEMOGLOBIN A1C: Hgb A1c MFr Bld: 5.3 % (ref 4.6–6.5)

## 2023-08-01 LAB — VITAMIN D 25 HYDROXY (VIT D DEFICIENCY, FRACTURES): VITD: 51.41 ng/mL (ref 30.00–100.00)

## 2023-08-01 LAB — TSH: TSH: 4.04 u[IU]/mL (ref 0.35–5.50)

## 2023-08-01 LAB — URIC ACID: Uric Acid, Serum: 6.7 mg/dL (ref 2.4–7.0)

## 2023-08-01 NOTE — Patient Instructions (Signed)

## 2023-08-01 NOTE — Progress Notes (Signed)
 Subjective:  Patient ID: Jamie Salazar, female    DOB: [birth year]  Age: 63 y.o. MRN: [id number]  CC: Annual Exam, Hypertension, Hyperlipidemia, and Diabetes   HPI Jamie Salazar presents for a CPX and f/up ----  Discussed the use of AI scribe software for clinical note transcription with the patient, who gave verbal consent to proceed.  History of Present Illness   Jamie Salazar "Archie Bearded" is a 63 year old female who presents with joint pain and gout symptoms.  She experiences migratory joint pain affecting the right shoulder, back, and left shoulder, which she attributes to arthritis. She uses off-brand acetaminophen, 325 mg, for relief, which provides some alleviation. No chest pain, shortness of breath, excessive thirst, excessive urination, or drastic changes in weight.  She notes an increase in appetite and questions if this is related to her current medication, Mounjaro, which she is taking at a dose of 10 mg.  She has not had an eye exam this year following the retirement of her previous eye doctor, Dr. Gennie Kicks, who retired last year.   She is active and denies DOE, CP, edema.       Outpatient Medications Prior to Visit  Medication Sig Dispense Refill   allopurinol (ZYLOPRIM) 100 MG tablet Take 1 tablet (100 mg total) by mouth daily. 90 tablet 1   colchicine 0.6 MG tablet Take 1.2mg  (2 tablets) then 0.6mg  (1 tablet) 1 hour after. Then, take 1 tablet every day for 7 days. 10 tablet 2   KERENDIA 20 MG TABS TAKE 1 TABLET(20 MG) BY MOUTH DAILY 90 tablet 0   levocetirizine (XYZAL) 5 MG tablet Take one tablet by mouth daily. (Patient taking differently: Take 5 mg by mouth 2 (two) times daily. Take one tablet by mouth daily.) 30 tablet 5   losartan (COZAAR) 25 MG tablet TAKE 1 TABLET(25 MG) BY MOUTH DAILY AT 10 PM 90 tablet 3   metoprolol succinate (TOPROL-XL) 25 MG 24 hr tablet TAKE 1 TABLET BY MOUTH EVERY MORNING. HOLD IF SYSTOLIC( TOP NUMBER) LESS THAN 100 OR PULSE LESS  THAN 60 90 tablet 0   MOUNJARO 10 MG/0.5ML Pen ADMINISTER 10 MG UNDER THE SKIN 1 TIME A WEEK 6 mL 0   rosuvastatin (CRESTOR) 5 MG tablet TAKE 1 TABLET(5 MG) BY MOUTH DAILY 90 tablet 0   Zoster Vaccine Adjuvanted [facility]) injection Inject 0.5 mLs into the muscle once for 1 dose. 0.5 mL 1   No facility-administered medications prior to visit.    ROS Review of Systems  Constitutional:  Positive for unexpected weight change. Negative for appetite change, chills, diaphoresis and fatigue.  HENT: Negative.    Eyes: Negative.   Respiratory:  Negative for cough, chest tightness, shortness of breath and wheezing.   Cardiovascular:  Negative for chest pain, palpitations and leg swelling.  Gastrointestinal: Negative.  Negative for abdominal pain, constipation, diarrhea, nausea and vomiting.  Endocrine: Negative.   Genitourinary: Negative.  Negative for difficulty urinating.  Musculoskeletal:  Positive for arthralgias. Negative for myalgias.  Skin: Negative.   Neurological:  Negative for dizziness, weakness, light-headedness and numbness.  Hematological:  Negative for adenopathy. Does not bruise/bleed easily.  Psychiatric/Behavioral:  Positive for confusion and decreased concentration. The patient is not nervous/anxious and is not hyperactive.     Objective:  BP (!) 152/96 (BP Location: Left Arm, Patient Position: Sitting, Cuff Size: Large)   Pulse 85   Temp 98.1 F (36.7 C) (Oral)   Resp 16   Ht 5\' 2"  (  1.575 m)   Wt (!) 300 lb 6.4 oz (136.3 kg)   SpO2 91%   BMI 54.94 kg/m   BP Readings from Last 3 Encounters:  08/01/23 (!) 152/96  01/31/23 126/84  10/20/22 (!) 151/90    Wt Readings from Last 3 Encounters:  08/01/23 (!) 300 lb 6.4 oz (136.3 kg)  01/31/23 290 lb (131.5 kg)  10/13/22 287 lb (130.2 kg)    Physical Exam Vitals reviewed.  Constitutional:      Appearance: Normal appearance.  HENT:     Nose: Nose normal.     Mouth/Throat:     Mouth: Mucous membranes are moist.   Eyes:     General: No scleral icterus.    Conjunctiva/sclera: Conjunctivae normal.  Cardiovascular:     Rate and Rhythm: Normal rate and regular rhythm.     Heart sounds: Normal heart sounds and S1 normal. No murmur heard.    Comments: EKG- NSR, 94 bpm No LVH, Q waves, or ST/T wave changes  Pulmonary:     Effort: Pulmonary effort is normal.     Breath sounds: No stridor. No wheezing, rhonchi or rales.  Abdominal:     General: Abdomen is flat.     Palpations: There is no mass.     Tenderness: There is no abdominal tenderness. There is no guarding.     Hernia: No hernia is present.  Musculoskeletal:        General: Normal range of motion.     Cervical back: Neck supple.     Right lower leg: No edema.     Left lower leg: No edema.  Lymphadenopathy:     Cervical: No cervical adenopathy.  Skin:    General: Skin is warm and dry.  Neurological:     General: No focal deficit present.     Mental Status: She is alert. Mental status is at baseline.  Psychiatric:        Mood and Affect: Mood normal.        Behavior: Behavior normal.     Lab Results  Component Value Date   WBC 6.5 08/01/2023   HGB 13.9 08/01/2023   HCT 42.4 08/01/2023   PLT 185.0 08/01/2023   GLUCOSE 87 08/01/2023   CHOL 119 08/01/2023   TRIG 49.0 08/01/2023   HDL 64.40 08/01/2023   LDLCALC 45 08/01/2023   ALT 15 08/01/2023   AST 17 08/01/2023   NA 140 08/01/2023   K 3.7 08/01/2023   CL 105 08/01/2023   CREATININE 1.00 08/01/2023   BUN 17 08/01/2023   CO2 26 08/01/2023   TSH 4.04 08/01/2023   HGBA1C 5.3 08/01/2023   MICROALBUR 4.9 (H) 08/01/2023    MM 3D SCREENING MAMMOGRAM BILATERAL BREAST Result Date: 09/05/2022 CLINICAL DATA:  Screening. EXAM: DIGITAL SCREENING BILATERAL MAMMOGRAM WITH TOMOSYNTHESIS AND CAD TECHNIQUE: Bilateral screening digital craniocaudal and mediolateral oblique mammograms were obtained. Bilateral screening digital breast tomosynthesis was performed. The images were evaluated  with computer-aided detection. COMPARISON:  Previous exam(s). ACR Breast Density Category a: The breasts are almost entirely fatty. FINDINGS: There are no findings suspicious for malignancy. IMPRESSION: No mammographic evidence of malignancy. A result letter of this screening mammogram will be mailed directly to the patient. RECOMMENDATION: Screening mammogram in one year. (Code:SM-B-01Y) BI-RADS CATEGORY  1: Negative. Electronically Signed   By: Allena Ito M.D.   On: 09/05/2022 10:41    Assessment & Plan:  Essential hypertension- She has not achieved her BP goal. Will add amlodipine. EKG is  negative for LVH. -     EKG 12-Lead -     TSH; Future -     Urinalysis, Routine w reflex microscopic; Future -     Basic metabolic panel with GFR; Future -     CBC with Differential/Platelet; Future -     amLODIPine Besylate; Take 1 tablet (5 mg total) by mouth daily.  Dispense: 90 tablet; Refill: 0 -     AMB Referral VBCI Care Management  Type II diabetes mellitus with manifestations (HCC)- Blood sugar is well controlled. -     HM Diabetes Foot Exam -     Hemoglobin A1c; Future -     Basic metabolic panel with GFR; Future -     Ambulatory referral to Ophthalmology  Encounter for general adult medical examination with abnormal findings- Exam completed, labs reviewed, vaccines reviewed and updated, cancer screenings addressed, pt ed material was given.   Mild hyperparathyroidism (HCC)- Ca++ and PTH are normal. -     VITAMIN D 25 Hydroxy (Vit-D Deficiency, Fractures); Future -     PTH, intact and calcium; Future  MORBID OBESITY  Idiopathic chronic gout of multiple sites without tophus -     Uric acid; Future  Screen for colon cancer -     Cologuard  Diabetic nephropathy associated with type 2 diabetes mellitus (HCC) -     Microalbumin / creatinine urine ratio; Future -     Urinalysis, Routine w reflex microscopic; Future  Hyperlipidemia with target LDL less than 130 - LDL goal achieved.  Doing well on the statin  -     Lipid panel; Future -     Hepatic function panel; Future     Follow-up: Return in about 3 months (around 10/31/2023).  Sandra Crouch, MD

## 2023-08-03 LAB — PTH, INTACT AND CALCIUM
Calcium: 9.6 mg/dL (ref 8.6–10.4)
PTH: 39 pg/mL (ref 16–77)

## 2023-08-05 ENCOUNTER — Encounter: Payer: Self-pay | Admitting: Internal Medicine

## 2023-08-05 DIAGNOSIS — Z23 Encounter for immunization: Secondary | ICD-10-CM | POA: Insufficient documentation

## 2023-08-05 MED ORDER — AMLODIPINE BESYLATE 5 MG PO TABS: 5.0000 mg | ORAL_TABLET | Freq: Every day | ORAL | 0 refills | Status: DC

## 2023-08-05 MED ORDER — SHINGRIX 50 MCG/0.5ML IM SUSR: 0.5000 mL | Freq: Once | INTRAMUSCULAR | 1 refills | Status: AC

## 2023-08-06 ENCOUNTER — Telehealth: Payer: Self-pay | Admitting: Internal Medicine

## 2023-08-06 DIAGNOSIS — Z1211 Encounter for screening for malignant neoplasm of colon: Secondary | ICD-10-CM | POA: Diagnosis not present

## 2023-08-06 NOTE — Telephone Encounter (Signed)
 Copied from CRM [id number]. Topic: Clinical - Medical Advice >> Aug 06, 2023  2:32 PM Jamie Salazar wrote: Reason for CRM: Pharmacy told patient that her amLODipine (NORVASC) 5 MG tablet was for high blood pressure. Patient said she's already taking 2 other medications for blood pressure and was wondering if she needs to stop taking a certain kind of medication.  Patient also wanted to know if it would be okay for her to get both shingles and flu vaccination on the same day.

## 2023-08-07 ENCOUNTER — Other Ambulatory Visit: Payer: Self-pay | Admitting: Internal Medicine

## 2023-08-07 DIAGNOSIS — Z1231 Encounter for screening mammogram for malignant neoplasm of breast: Secondary | ICD-10-CM

## 2023-08-07 NOTE — Telephone Encounter (Signed)
 Patient has been made aware that it is okay for her to take her medication as prescribed by Dr. Rochelle Chu. I also let her know she is due for her shingles vaccine and can get it at her convenience. Flu season almost over so she can see if the pharmacy still has an flu vaccines left. She gave a verbal understanding.

## 2023-08-09 ENCOUNTER — Telehealth: Payer: Self-pay | Admitting: *Deleted

## 2023-08-09 NOTE — Progress Notes (Signed)
 Care Guide Pharmacy Note  08/09/2023 Name: Jamie Salazar MRN: [id number] DOB: Apr 22, 1961  Referred By: Arcadio Knuckles, MD Reason for referral: Complex Care Management and Call Attempt #1 (Outreach to schedule referral with pharmacist )   Jamie Salazar is a 63 y.o. year old female who is a primary care patient of Arcadio Knuckles, MD.  Jamie Salazar was referred to the pharmacist for assistance related to: HTN  An unsuccessful telephone outreach was attempted today to contact the patient who was referred to the pharmacy team for assistance with medication management. Additional attempts will be made to contact the patient.  Kandis Ormond, CMA [place]  [facility], [facility] Guide Direct Dial: [phone]  Fax: [phone] Website: [email or web address]

## 2023-08-10 LAB — COLOGUARD: COLOGUARD: NEGATIVE

## 2023-08-10 NOTE — Progress Notes (Signed)
 Care Guide Pharmacy Note  08/10/2023 Name: Jamie Salazar MRN: [id number] DOB: Jan 24, 1961  Referred By: Arcadio Knuckles, MD Reason for referral: Complex Care Management and Call Attempt #1 (Outreach to schedule referral with pharmacist )   Jamie Salazar is a 63 y.o. year old female who is a primary care patient of Arcadio Knuckles, MD.  Jamie Salazar was referred to the pharmacist for assistance related to: HTN  Successful contact was made with the patient to discuss pharmacy services including being ready for the pharmacist to call at least 5 minutes before the scheduled appointment time and to have medication bottles and any blood pressure readings ready for review. The patient agreed to meet with the pharmacist via telephone visit on 08/28/2023  Jamie Salazar, CMA [place]  [facility], [facility] Guide Direct Dial: [phone]  Fax: [phone] Website: [email or web address]

## 2023-08-10 NOTE — Progress Notes (Signed)
 Care Guide Pharmacy Note  08/10/2023 Name: CAYTLIN BETTER MRN: [id number] DOB: 03/07/1961  Referred By: Arcadio Knuckles, MD Reason for referral: Complex Care Management and Call Attempt #1 (Outreach to schedule referral with pharmacist )   DIAVIAN FURGASON is a 63 y.o. year old female who is a primary care patient of Arcadio Knuckles, MD.  Connye Delaine was referred to the pharmacist for assistance related to: HTN  A second unsuccessful telephone outreach was attempted today to contact the patient who was referred to the pharmacy team for assistance with medication management. Additional attempts will be made to contact the patient.  Kandis Ormond, CMA [place]  [facility], [facility] Guide Direct Dial: [phone]  Fax: [phone] Website: [email or web address]

## 2023-08-13 ENCOUNTER — Other Ambulatory Visit

## 2023-08-28 ENCOUNTER — Other Ambulatory Visit

## 2023-08-29 ENCOUNTER — Encounter (HOSPITAL_COMMUNITY): Payer: Self-pay

## 2023-09-05 ENCOUNTER — Ambulatory Visit
Admission: RE | Admit: 2023-09-05 | Discharge: 2023-09-05 | Disposition: A | Discharge status: Home / Self Care | Source: Ambulatory Visit | Attending: Internal Medicine | Admitting: Internal Medicine

## 2023-09-05 DIAGNOSIS — Z1231 Encounter for screening mammogram for malignant neoplasm of breast: Secondary | ICD-10-CM | POA: Diagnosis not present

## 2023-09-18 ENCOUNTER — Telehealth: Payer: Self-pay | Admitting: Internal Medicine

## 2023-09-18 NOTE — Telephone Encounter (Signed)
 Copied from CRM [id number]. Topic: General - Other >> Sep 18, 2023  9:10 AM Annelle Kiel wrote: Reason for CRM: patient is requesting a referral for dermatology to see Dr. Drusilla Gerlach

## 2023-09-19 ENCOUNTER — Other Ambulatory Visit: Payer: Self-pay | Admitting: Internal Medicine

## 2023-09-19 NOTE — Telephone Encounter (Signed)
 Place this referral please ?

## 2023-09-19 NOTE — Telephone Encounter (Signed)
 What is the diagnosis or concern?

## 2023-09-20 ENCOUNTER — Other Ambulatory Visit: Payer: Self-pay | Admitting: Internal Medicine

## 2023-09-20 DIAGNOSIS — R000 Tachycardia, unspecified: Secondary | ICD-10-CM

## 2023-09-21 ENCOUNTER — Other Ambulatory Visit: Payer: Self-pay | Admitting: Internal Medicine

## 2023-09-21 DIAGNOSIS — L219 Seborrheic dermatitis, unspecified: Secondary | ICD-10-CM | POA: Insufficient documentation

## 2023-09-21 NOTE — Telephone Encounter (Signed)
 Patient states her scalp is the concern. It itches and flakes really bad like she has very bad dandruff and she has a burning sensation.

## 2023-10-02 ENCOUNTER — Other Ambulatory Visit: Payer: Self-pay | Admitting: Internal Medicine

## 2023-10-02 DIAGNOSIS — E118 Type 2 diabetes mellitus with unspecified complications: Secondary | ICD-10-CM

## 2023-10-08 ENCOUNTER — Ambulatory Visit: Payer: Self-pay | Admitting: Cardiology

## 2023-10-22 ENCOUNTER — Other Ambulatory Visit: Payer: Self-pay | Admitting: Internal Medicine

## 2023-10-22 ENCOUNTER — Other Ambulatory Visit: Payer: Self-pay | Admitting: Cardiology

## 2023-10-22 DIAGNOSIS — E1121 Type 2 diabetes mellitus with diabetic nephropathy: Secondary | ICD-10-CM

## 2023-10-22 DIAGNOSIS — E119 Type 2 diabetes mellitus without complications: Secondary | ICD-10-CM

## 2023-10-22 DIAGNOSIS — E785 Hyperlipidemia, unspecified: Secondary | ICD-10-CM

## 2023-10-22 DIAGNOSIS — I10 Essential (primary) hypertension: Secondary | ICD-10-CM

## 2023-10-22 DIAGNOSIS — E118 Type 2 diabetes mellitus with unspecified complications: Secondary | ICD-10-CM

## 2023-10-31 ENCOUNTER — Ambulatory Visit: Admitting: Internal Medicine

## 2023-11-15 ENCOUNTER — Encounter: Payer: Self-pay | Admitting: Cardiology

## 2023-11-15 ENCOUNTER — Ambulatory Visit: Attending: Cardiology | Admitting: Cardiology

## 2023-11-15 VITALS — BP 156/85 | HR 95 | Resp 16 | Ht 62.0 in | Wt 307.0 lb

## 2023-11-15 DIAGNOSIS — R0602 Shortness of breath: Secondary | ICD-10-CM

## 2023-11-15 DIAGNOSIS — E785 Hyperlipidemia, unspecified: Secondary | ICD-10-CM

## 2023-11-15 DIAGNOSIS — E66813 Obesity, class 3: Secondary | ICD-10-CM

## 2023-11-15 DIAGNOSIS — I10 Essential (primary) hypertension: Secondary | ICD-10-CM | POA: Diagnosis not present

## 2023-11-15 DIAGNOSIS — E119 Type 2 diabetes mellitus without complications: Secondary | ICD-10-CM

## 2023-11-15 DIAGNOSIS — Z6841 Body Mass Index (BMI) 40.0 and over, adult: Secondary | ICD-10-CM

## 2023-11-15 DIAGNOSIS — E1169 Type 2 diabetes mellitus with other specified complication: Secondary | ICD-10-CM

## 2023-11-15 NOTE — Patient Instructions (Signed)
 Follow-Up: At [facility], you and your health needs are our priority.  As part of our continuing mission to provide you with exceptional heart care, our providers are all part of one team.  This team includes your primary Cardiologist (physician) and Advanced Practice Providers or APPs (Physician Assistants and Nurse Practitioners) who all work together to provide you with the care you need, when you need it.  Your next appointment:   As needed  Provider:   Madonna Large, DO    We recommend signing up for the patient portal called MyChart.  Sign up information is provided on this After Visit Summary.  MyChart is used to connect with patients for Virtual Visits (Telemedicine).  Patients are able to view lab/test results, encounter notes, upcoming appointments, etc.  Non-urgent messages can be sent to your provider as well.   To learn more about what you can do with MyChart, go to [email or web address].

## 2023-11-15 NOTE — Progress Notes (Signed)
 Cardiology Office Note:  .   ID:  Jamie QUIZHPI, DOB 04/27/1960, MRN [id number] PCP:  Joshua Debby LITTIE, MD  Former Cardiology Providers: NA [facility] Providers Cardiologist:  Madonna Large, DO , [facility] (established care 03/08/2021) Electrophysiologist:  None  Click to update primary MD,subspecialty MD or APP then REFRESH:1}    Chief Complaint  Patient presents with   Shortness of Breath   Follow-up    History of Present Illness: .   Jamie Salazar is a 64 y.o. African-American female whose past medical history and cardiovascular risk factors includes: NIDDM, HTN, Hyperlipidemia, obesity due to excess calories.   Patient referred to the practice for evaluation of dyspnea.  Has undergone appropriate workup.  At the last office visit she had lost a significant amount of weight due to lifestyle modifications, regular exercise, and being on Mounjaro .  Started her on losartan  25 mg p.o. every afternoon last office visit due to elevated blood pressures and history of diabetes for renal protection.  She presents today for a 1 year follow-up visit.  Over the last 1 year she has gained back 20 pounds since last office visit.  Patient denies anginal chest pain or heart failure symptoms since last office visit. However shortness of breath with further activities remains chronic and stable without change in intensity, frequency, and/or duration. Office blood pressures are now well-controlled; however, patient states that home blood pressures are usually <130 mmHg. Patient states that losartan  was ineffective and it worsened her gout symptoms. PCP had given her amlodipine  but still has not started it. And her kerenedia was reduced from 20 mg to 10 mg p.o. daily  Review of Systems: .   Review of Systems  Cardiovascular:  Negative for chest pain, claudication, irregular heartbeat, leg swelling, near-syncope, orthopnea, palpitations, paroxysmal nocturnal dyspnea and syncope.   Respiratory:  Positive for shortness of breath (chronic and stable).   Hematologic/Lymphatic: Negative for bleeding problem.    Studies Reviewed:   EKG: EKG Interpretation Date/Time:  Thursday November 15 2023 10:17:13 EDT Ventricular Rate:  100 PR Interval:  190 QRS Duration:  78 QT Interval:  366 QTC Calculation: 472 R Axis:   16  Text Interpretation: Sinus rhythm Cannot rule out Anterior infarct , age undetermined No previous ECGs available Confirmed by Large Madonna [phone]) on 11/15/2023 10:47:42 AM  Echocardiogram: 05/16/2021: Left ventricle cavity is normal in size. Mild concentric hypertrophy of the left ventricle. Normal global wall motion. Normal LV systolic function with EF 56%. Indeterminate diastolic filling pattern.  No significant valvular abnormality. Normal right atrial pressure.   Coronary calcium  score report 03/31/2021 No visible coronary artery calcifications. Total coronary calcium  score of 0.   Left pleural lipoma.   Scattered low-density lesions in the liver, difficult to characterize. Recommend further evaluation with right upper quadrant ultrasound or contrast-enhanced abdominal CT.   14 day extended Holter monitor: Dominant rhythm normal sinus. Heart rate 56-169 bpm. Avg HR 84 bpm. No atrial fibrillation, ventricular tachycardia, high grade AV block, pauses (3 seconds or longer). Total ventricular ectopic burden <1%. Total supraventricular ectopic burden <1%. Rare episodes of supraventricular tachycardia.  Patient triggered events: 0.   RADIOLOGY: NA  Risk Assessment/Calculations:   NA   Labs:       Latest Ref Rng & Units 08/01/2023   10:38 AM 08/01/2022    9:51 AM 01/04/2022   10:30 AM  CBC  WBC 4.0 - 10.5 K/uL 6.5  7.7  8.5   Hemoglobin 12.0 - 15.0  g/dL 86.0  86.2  86.1   Hematocrit 36.0 - 46.0 % 42.4  42.2  42.2   Platelets 150.0 - 400.0 K/uL 185.0  220.0  210.0        Latest Ref Rng & Units 08/01/2023   10:38 AM 01/31/2023   10:12 AM  10/20/2022   10:43 AM  BMP  Glucose 70 - 99 mg/dL 87  90  88   BUN 6 - 23 mg/dL 17  14  17    Creatinine 0.40 - 1.20 mg/dL 8.99  9.01  8.83   BUN/Creat Ratio 12 - 28   15   Sodium 135 - 145 mEq/L 140  139  137   Potassium 3.5 - 5.1 mEq/L 3.7  3.7  4.3   Chloride 96 - 112 mEq/L 105  105  102   CO2 19 - 32 mEq/L 26  27  22    Calcium  8.6 - 10.4 mg/dL 8.4 - 89.4 mg/dL 9.6    9.7  9.8  9.5       Latest Ref Rng & Units 08/01/2023   10:38 AM 01/31/2023   10:12 AM 10/20/2022   10:43 AM  CMP  Glucose 70 - 99 mg/dL 87  90  88   BUN 6 - 23 mg/dL 17  14  17    Creatinine 0.40 - 1.20 mg/dL 8.99  9.01  8.83   Sodium 135 - 145 mEq/L 140  139  137   Potassium 3.5 - 5.1 mEq/L 3.7  3.7  4.3   Chloride 96 - 112 mEq/L 105  105  102   CO2 19 - 32 mEq/L 26  27  22    Calcium  8.6 - 10.4 mg/dL 8.4 - 89.4 mg/dL 9.6    9.7  9.8  9.5   Total Protein 6.0 - 8.3 g/dL 8.2     Total Bilirubin 0.2 - 1.2 mg/dL 0.6     Alkaline Phos 39 - 117 U/L 95     AST 0 - 37 U/L 17     ALT 0 - 35 U/L 15       Lab Results  Component Value Date   CHOL 119 08/01/2023   HDL 64.40 08/01/2023   LDLCALC 45 08/01/2023   TRIG 49.0 08/01/2023   CHOLHDL 2 08/01/2023   No results for input(s): LIPOA in the last 8760 hours. No components found for: NTPROBNP No results for input(s): PROBNP in the last 8760 hours. Recent Labs    08/01/23 1038  TSH 4.04    Physical Exam:    Today's Vitals   11/15/23 1014  BP: (!) 156/85  Pulse: 95  Resp: 16  SpO2: 98%  Weight: (!) 307 lb (139.3 kg)  Height: 5' 2 (1.575 m)   Body mass index is 56.15 kg/m. Wt Readings from Last 3 Encounters:  11/15/23 (!) 307 lb (139.3 kg)  08/01/23 (!) 300 lb 6.4 oz (136.3 kg)  01/31/23 290 lb (131.5 kg)    Physical Exam  Constitutional: No distress.  Age appropriate, hemodynamically stable.   Neck: No JVD present.  Cardiovascular: Normal rate, regular rhythm, S1 normal, S2 normal, intact distal pulses and normal pulses. Exam reveals no  gallop, no S3 and no S4.  No murmur heard. Pulmonary/Chest: Effort normal and breath sounds normal. No stridor. She has no wheezes. She has no rales.  Abdominal: Soft. Bowel sounds are normal. She exhibits no distension. There is no abdominal tenderness.  Abdominal obesity  Musculoskeletal:  General: No edema.     Cervical back: Neck supple.  Neurological: She is alert and oriented to person, place, and time. She has intact cranial nerves (2-12).  Skin: Skin is warm and moist.     Impression & Recommendation(s):  Impression:   ICD-10-CM   1. Shortness of breath  R06.02 EKG 12-Lead    2. Benign hypertension  I10     3. Non-insulin dependent type 2 diabetes mellitus (HCC)  E11.9     4. Type 2 diabetes mellitus with hyperlipidemia (HCC)  E11.69    E78.5     5. Class 3 severe obesity due to excess calories with serious comorbidity and body mass index (BMI) of 50.0 to 59.9 in adult  E66.813    Z68.43        Recommendation(s):  Shortness of breath Chronic and stable. In the past had improved significantly with weight loss; however, since last office visit she has regained 20 pounds. EKG is nonischemic. Has undergone appropriate workup in the past. Since overall intensity frequency and duration has not changed we will continue conservative management for now.  Patient agreeable with the plan of care  Benign hypertension Office blood pressure not well-controlled. Currently on Kerendia  10 mg p.o. daily. Currently on Toprol -XL 25 mg p.o. daily Losartan  no longer used as it is her belief that exacerbates gout PCP prescribed amlodipine  which she has not started. Blood pressures are <130 mmHg according to the patient Will defer management to PCP for now.  Non-insulin dependent type 2 diabetes mellitus (HCC) Re emphasized importance of glycemic control. Chooses not to be on ARB for reasons mentioned above. Currently on Mounjaro , Crestor   Type 2 diabetes mellitus with  hyperlipidemia (HCC) Currently on Crestor  5 mg p.o. daily.   She denies myalgia or other side effects. Most recent lipids dated August 01, 2023, independently reviewed as noted above.  LDL is 45 mg/dL.  Cardiology is following peripherally.  Class 3 severe obesity due to excess calories with serious comorbidity and body mass index (BMI) of 50.0 to 59.9 in adult Body mass index is 56.15 kg/m. I reviewed with her importance of diet, regular physical activity/exercise, weight loss.   Patient is educated on the importance of increasing physical activity gradually as tolerated with a goal of moderate intensity exercise for 30 minutes a day 5 days a week.  Orders Placed:  Orders Placed This Encounter  Procedures   EKG 12-Lead   Overall stable from a cardiovascular standpoint. Her chronic comorbid conditions can be managed by PCP-will refer her back to her primary provider For now we will see her back on as-needed basis, sooner if needed.  Patient agreeable with the plan of care  Final Medication List:   No orders of the defined types were placed in this encounter.   Medications Discontinued During This Encounter  Medication Reason   KERENDIA  20 MG TABS Patient Preference     Current Outpatient Medications:    allopurinol  (ZYLOPRIM ) 100 MG tablet, Take 1 tablet (100 mg total) by mouth daily., Disp: 90 tablet, Rfl: 1   amLODipine  (NORVASC ) 5 MG tablet, Take 1 tablet (5 mg total) by mouth daily., Disp: 90 tablet, Rfl: 0   colchicine  0.6 MG tablet, Take 1.2mg  (2 tablets) then 0.6mg  (1 tablet) 1 hour after. Then, take 1 tablet every day for 7 days., Disp: 10 tablet, Rfl: 2   KERENDIA  10 MG TABS, TAKE 1 TABLET BY MOUTH DAILY, Disp: 30 tablet, Rfl: 0   levocetirizine (XYZAL )  5 MG tablet, Take one tablet by mouth daily. (Patient taking differently: Take 5 mg by mouth 2 (two) times daily. Take one tablet by mouth daily.), Disp: 30 tablet, Rfl: 5   losartan  (COZAAR ) 25 MG tablet, TAKE 1 TABLET(25  MG) BY MOUTH DAILY AT 10 PM, Disp: 90 tablet, Rfl: 3   metoprolol  succinate (TOPROL -XL) 25 MG 24 hr tablet, TAKE 1 TABLET BY MOUTH EVERY MORNING. HOLD IF SYSTOLIC( TOP NUMBER) LESS THAN 100 OR PULSE LESS THAN 60, Disp: 90 tablet, Rfl: 0   MOUNJARO  10 MG/0.5ML Pen, ADMINISTER 10 MG UNDER THE SKIN 1 TIME A WEEK, Disp: 6 mL, Rfl: 0   rosuvastatin  (CRESTOR ) 5 MG tablet, TAKE 1 TABLET(5 MG) BY MOUTH DAILY, Disp: 90 tablet, Rfl: 0   Zoster Vaccine Adjuvanted (SHINGRIX ) injection, Inject 0.5 mLs into the muscle once for 1 dose., Disp: 0.5 mL, Rfl: 1  Consent:   NA  Disposition:   As needed  Her questions and concerns were addressed to her satisfaction. She voices understanding of the recommendations provided during this encounter.    Signed, Madonna Michele HAS, [facility]  A Division of [facility] [address] [place], [place] [address]  [place], [place] [address]

## 2023-11-19 ENCOUNTER — Other Ambulatory Visit: Payer: Self-pay | Admitting: Internal Medicine

## 2023-11-19 DIAGNOSIS — E1121 Type 2 diabetes mellitus with diabetic nephropathy: Secondary | ICD-10-CM

## 2023-11-19 DIAGNOSIS — E118 Type 2 diabetes mellitus with unspecified complications: Secondary | ICD-10-CM

## 2023-11-20 ENCOUNTER — Encounter: Payer: Self-pay | Admitting: Cardiology

## 2023-11-25 ENCOUNTER — Other Ambulatory Visit: Payer: Self-pay | Admitting: Internal Medicine

## 2023-11-25 DIAGNOSIS — M1A09X0 Idiopathic chronic gout, multiple sites, without tophus (tophi): Secondary | ICD-10-CM

## 2023-11-28 NOTE — Telephone Encounter (Signed)
 Last OV 08/01/23, f/u 3 months Next OV 10/31/23 canceled   Last refill 08/02/22 Qty #90/1

## 2023-12-19 ENCOUNTER — Other Ambulatory Visit: Payer: Self-pay | Admitting: Internal Medicine

## 2023-12-19 DIAGNOSIS — E118 Type 2 diabetes mellitus with unspecified complications: Secondary | ICD-10-CM

## 2023-12-19 DIAGNOSIS — E1121 Type 2 diabetes mellitus with diabetic nephropathy: Secondary | ICD-10-CM

## 2023-12-19 DIAGNOSIS — R000 Tachycardia, unspecified: Secondary | ICD-10-CM

## 2023-12-20 ENCOUNTER — Other Ambulatory Visit: Payer: Self-pay | Admitting: Internal Medicine

## 2023-12-20 DIAGNOSIS — E118 Type 2 diabetes mellitus with unspecified complications: Secondary | ICD-10-CM

## 2024-01-10 DIAGNOSIS — H35033 Hypertensive retinopathy, bilateral: Secondary | ICD-10-CM | POA: Diagnosis not present

## 2024-01-10 DIAGNOSIS — E119 Type 2 diabetes mellitus without complications: Secondary | ICD-10-CM | POA: Diagnosis not present

## 2024-01-10 DIAGNOSIS — H35363 Drusen (degenerative) of macula, bilateral: Secondary | ICD-10-CM | POA: Diagnosis not present

## 2024-01-10 DIAGNOSIS — H33192 Other retinoschisis and retinal cysts, left eye: Secondary | ICD-10-CM | POA: Diagnosis not present

## 2024-01-10 DIAGNOSIS — H25813 Combined forms of age-related cataract, bilateral: Secondary | ICD-10-CM | POA: Diagnosis not present

## 2024-01-10 LAB — HM DIABETES EYE EXAM

## 2024-01-17 ENCOUNTER — Ambulatory Visit

## 2024-01-21 ENCOUNTER — Other Ambulatory Visit: Payer: Self-pay | Admitting: Internal Medicine

## 2024-01-21 DIAGNOSIS — E118 Type 2 diabetes mellitus with unspecified complications: Secondary | ICD-10-CM

## 2024-01-21 DIAGNOSIS — E785 Hyperlipidemia, unspecified: Secondary | ICD-10-CM

## 2024-01-21 DIAGNOSIS — E1121 Type 2 diabetes mellitus with diabetic nephropathy: Secondary | ICD-10-CM

## 2024-01-24 ENCOUNTER — Other Ambulatory Visit: Payer: Self-pay

## 2024-01-24 ENCOUNTER — Other Ambulatory Visit: Payer: Self-pay | Admitting: Internal Medicine

## 2024-01-24 ENCOUNTER — Telehealth: Payer: Self-pay

## 2024-01-24 DIAGNOSIS — E1121 Type 2 diabetes mellitus with diabetic nephropathy: Secondary | ICD-10-CM

## 2024-01-24 DIAGNOSIS — E118 Type 2 diabetes mellitus with unspecified complications: Secondary | ICD-10-CM

## 2024-01-24 DIAGNOSIS — E785 Hyperlipidemia, unspecified: Secondary | ICD-10-CM

## 2024-01-24 MED ORDER — ROSUVASTATIN CALCIUM 5 MG PO TABS: 5.0000 mg | ORAL_TABLET | Freq: Every day | ORAL | 0 refills | Status: DC

## 2024-01-24 MED ORDER — KERENDIA 10 MG PO TABS: 1.0000 | ORAL_TABLET | Freq: Every day | ORAL | 0 refills | Status: DC

## 2024-01-24 NOTE — Telephone Encounter (Signed)
 Copied from CRM #[id number]. Topic: Clinical - Prescription Issue >> Jan 23, 2024  5:25 PM Jamie Salazar wrote: Reason for CRM: FYI only- pt stated [facility] told her to follow up with her PCP office because they had not heard from us  on getting her KERENDIA  10 MG TABS. She is calling to try to get them sooner. I let her know it has been 2 days and it can take up to 3 business days.

## 2024-01-24 NOTE — Telephone Encounter (Signed)
 A 30 day supply of medication has been sent to her pharmacy. Patient needs to schedule an appointment with Dr. Joshua for further refills.

## 2024-01-24 NOTE — Telephone Encounter (Signed)
 Appointment has been scheduled.

## 2024-01-25 ENCOUNTER — Encounter (INDEPENDENT_AMBULATORY_CARE_PROVIDER_SITE_OTHER): Admitting: Ophthalmology

## 2024-01-30 ENCOUNTER — Encounter (INDEPENDENT_AMBULATORY_CARE_PROVIDER_SITE_OTHER): Admitting: Ophthalmology

## 2024-02-13 ENCOUNTER — Telehealth: Payer: Self-pay

## 2024-02-13 ENCOUNTER — Encounter (INDEPENDENT_AMBULATORY_CARE_PROVIDER_SITE_OTHER): Admitting: Ophthalmology

## 2024-02-13 DIAGNOSIS — I10 Essential (primary) hypertension: Secondary | ICD-10-CM

## 2024-02-13 DIAGNOSIS — H2513 Age-related nuclear cataract, bilateral: Secondary | ICD-10-CM | POA: Diagnosis not present

## 2024-02-13 DIAGNOSIS — H43813 Vitreous degeneration, bilateral: Secondary | ICD-10-CM

## 2024-02-13 DIAGNOSIS — H353131 Nonexudative age-related macular degeneration, bilateral, early dry stage: Secondary | ICD-10-CM

## 2024-02-13 DIAGNOSIS — H33302 Unspecified retinal break, left eye: Secondary | ICD-10-CM

## 2024-02-13 DIAGNOSIS — H35033 Hypertensive retinopathy, bilateral: Secondary | ICD-10-CM | POA: Diagnosis not present

## 2024-02-13 NOTE — Telephone Encounter (Signed)
 Copied from CRM [id number]. Topic: Clinical - Medical Advice >> Feb 13, 2024 12:00 PM Ashley R wrote: Reason for CRM: Just left eye dr, placed on AREDS #2, would like to know if OK to proceed with prescriptions meds

## 2024-02-18 NOTE — Telephone Encounter (Unsigned)
 Copied from CRM [id number]. Topic: General - Other >> Feb 15, 2024  4:42 PM Sophia H wrote: Reason for CRM: Needing to speak with nurse at clinic regarding AREDs prescribed by eye doctor & if okay to proceed with all other meds like normal, message was routed to clinic with no response on 10/22. Called [facility] - stated Dr. Joshua & his nurse are not in. Patient states she has macular degeneration in both eyes.  Patient needs a call back by the end of the day Monday. # [phone]

## 2024-02-21 ENCOUNTER — Other Ambulatory Visit: Payer: Self-pay | Admitting: Internal Medicine

## 2024-02-21 ENCOUNTER — Encounter: Payer: Self-pay | Admitting: Internal Medicine

## 2024-02-21 ENCOUNTER — Ambulatory Visit: Admitting: Internal Medicine

## 2024-02-21 VITALS — BP 132/76 | HR 83 | Temp 97.7°F | Ht 62.0 in | Wt 307.2 lb

## 2024-02-21 DIAGNOSIS — E1142 Type 2 diabetes mellitus with diabetic polyneuropathy: Secondary | ICD-10-CM

## 2024-02-21 DIAGNOSIS — E118 Type 2 diabetes mellitus with unspecified complications: Secondary | ICD-10-CM

## 2024-02-21 DIAGNOSIS — F321 Major depressive disorder, single episode, moderate: Secondary | ICD-10-CM

## 2024-02-21 DIAGNOSIS — I10 Essential (primary) hypertension: Secondary | ICD-10-CM

## 2024-02-21 DIAGNOSIS — F331 Major depressive disorder, recurrent, moderate: Secondary | ICD-10-CM

## 2024-02-21 DIAGNOSIS — M1A09X0 Idiopathic chronic gout, multiple sites, without tophus (tophi): Secondary | ICD-10-CM | POA: Diagnosis not present

## 2024-02-21 DIAGNOSIS — E213 Hyperparathyroidism, unspecified: Secondary | ICD-10-CM | POA: Diagnosis not present

## 2024-02-21 DIAGNOSIS — Z124 Encounter for screening for malignant neoplasm of cervix: Secondary | ICD-10-CM

## 2024-02-21 DIAGNOSIS — R000 Tachycardia, unspecified: Secondary | ICD-10-CM

## 2024-02-21 DIAGNOSIS — Z6841 Body Mass Index (BMI) 40.0 and over, adult: Secondary | ICD-10-CM | POA: Insufficient documentation

## 2024-02-21 DIAGNOSIS — E1121 Type 2 diabetes mellitus with diabetic nephropathy: Secondary | ICD-10-CM

## 2024-02-21 DIAGNOSIS — M109 Gout, unspecified: Secondary | ICD-10-CM | POA: Insufficient documentation

## 2024-02-21 DIAGNOSIS — E785 Hyperlipidemia, unspecified: Secondary | ICD-10-CM

## 2024-02-21 DIAGNOSIS — Z1211 Encounter for screening for malignant neoplasm of colon: Secondary | ICD-10-CM

## 2024-02-21 DIAGNOSIS — M150 Primary generalized (osteo)arthritis: Secondary | ICD-10-CM | POA: Insufficient documentation

## 2024-02-21 LAB — BASIC METABOLIC PANEL WITH GFR
BUN: 22 mg/dL (ref 6–23)
CO2: 25 meq/L (ref 19–32)
Calcium: 9.5 mg/dL (ref 8.4–10.5)
Chloride: 105 meq/L (ref 96–112)
Creatinine, Ser: 1.06 mg/dL (ref 0.40–1.20)
GFR: 55.45 mL/min — ABNORMAL LOW (ref >60.00)
Glucose, Bld: 84 mg/dL (ref 70–99)
Potassium: 4.2 meq/L (ref 3.5–5.1)
Sodium: 135 meq/L (ref 135–145)

## 2024-02-21 LAB — CBC WITH DIFFERENTIAL/PLATELET
Basophils Absolute: 0 K/uL (ref 0.0–0.1)
Basophils Relative: 0.4 % (ref 0.0–3.0)
Eosinophils Absolute: 0.1 K/uL (ref 0.0–0.7)
Eosinophils Relative: 1.5 % (ref 0.0–5.0)
HCT: 41.6 % (ref 36.0–46.0)
Hemoglobin: 13.6 g/dL (ref 12.0–15.0)
Lymphocytes Relative: 21 % (ref 12.0–46.0)
Lymphs Abs: 1.3 K/uL (ref 0.7–4.0)
MCHC: 32.7 g/dL (ref 30.0–36.0)
MCV: 78.9 fl (ref 78.0–100.0)
Monocytes Absolute: 0.4 K/uL (ref 0.1–1.0)
Monocytes Relative: 6.5 % (ref 3.0–12.0)
Neutro Abs: 4.3 K/uL (ref 1.4–7.7)
Neutrophils Relative %: 70.6 % (ref 43.0–77.0)
Platelets: 174 K/uL (ref 150.0–400.0)
RBC: 5.27 Mil/uL — ABNORMAL HIGH (ref 3.87–5.11)
RDW: 15.9 % — ABNORMAL HIGH (ref 11.5–15.5)
WBC: 6.1 K/uL (ref 4.0–10.5)

## 2024-02-21 LAB — MICROALBUMIN / CREATININE URINE RATIO
Creatinine,U: 120 mg/dL
Microalb Creat Ratio: 18 mg/g (ref 0.0–30.0)
Microalb, Ur: 2.2 mg/dL — ABNORMAL HIGH (ref 0.0–1.9)

## 2024-02-21 LAB — HEPATIC FUNCTION PANEL
ALT: 13 U/L (ref 0–35)
AST: 18 U/L (ref 0–37)
Albumin: 4.1 g/dL (ref 3.5–5.2)
Alkaline Phosphatase: 90 U/L (ref 39–117)
Bilirubin, Direct: 0.2 mg/dL (ref 0.0–0.3)
Total Bilirubin: 0.6 mg/dL (ref 0.2–1.2)
Total Protein: 8.1 g/dL (ref 6.0–8.3)

## 2024-02-21 LAB — URIC ACID: Uric Acid, Serum: 5.9 mg/dL (ref 2.4–7.0)

## 2024-02-21 LAB — HEMOGLOBIN A1C: Hgb A1c MFr Bld: 5.4 % (ref 4.6–6.5)

## 2024-02-21 LAB — TSH: TSH: 5.24 u[IU]/mL (ref 0.35–5.50)

## 2024-02-21 MED ORDER — DULOXETINE HCL 30 MG PO CPEP: 30.0000 mg | ORAL_CAPSULE | Freq: Every day | ORAL | 0 refills | Status: DC

## 2024-02-21 NOTE — Telephone Encounter (Signed)
 Patient is being seen in the office currently 02/21/2024 and she is discussing this with Dr. Joshua.

## 2024-02-21 NOTE — Progress Notes (Signed)
 Subjective:  Patient ID: Jamie Salazar, female    DOB: 1960-09-24  Age: 63 y.o. MRN: [id number]  CC: Medical Management of Chronic Issues (3 month follow up )   HPI Jamie Salazar presents for f/up   Discussed the use of AI scribe software for clinical note transcription with the patient, who gave verbal consent to proceed.  History of Present Illness Jamie Salazar is a 63 year old female with depression and diabetic neuropathy who presents with worsening depressive symptoms.  She has a long-standing history of depression, which she attributes to her role as the eldest sibling, having to take on maternal responsibilities at a young age. Her depressive symptoms include a lack of motivation, excessive worrying, and poor sleep quality, particularly since the loss of her mother three years ago. No suicidal ideation is present. She has not taken antidepressants in the past due to fear of medication side effects.  She has diabetic neuropathy but denies any current diabetic symptoms such as excessive thirst or urination. There are no changes in her weight or appetite, although she mentions a tendency to eat without feeling hungry, which she associates with her depression.  She reports no current heart symptoms and states that her cardiologist performed an EKG during her visit earlier this year, which she was told was normal. She experiences no shortness of breath during physical activities such as sweeping or mopping.  She has a history of constipation but states that it is normal for her and not associated with any abdominal pain or discomfort.  She mentions anxiety related to medical visits, noting that her blood pressure was elevated during a previous visit due to anxiety while waiting.     Outpatient Medications Prior to Visit  Medication Sig Dispense Refill   colchicine  0.6 MG tablet Take 1.2mg  (2 tablets) then 0.6mg  (1 tablet) 1 hour after. Then, take 1 tablet every  day for 7 days. 10 tablet 2   levocetirizine (XYZAL ) 5 MG tablet Take one tablet by mouth daily. (Patient taking differently: Take 5 mg by mouth 2 (two) times daily. Take one tablet by mouth daily.) 30 tablet 5   losartan  (COZAAR ) 25 MG tablet TAKE 1 TABLET(25 MG) BY MOUTH DAILY AT 10 PM 90 tablet 3   MOUNJARO  10 MG/0.5ML Pen ADMINISTER 10 MG UNDER THE SKIN 1 TIME A WEEK 6 mL 0   Zoster Vaccine Adjuvanted (SHINGRIX ) injection Inject 0.5 mLs into the muscle once for 1 dose. 0.5 mL 1   allopurinol  (ZYLOPRIM ) 100 MG tablet TAKE 1 TABLET(100 MG) BY MOUTH DAILY 90 tablet 0   amLODipine  (NORVASC ) 5 MG tablet Take 1 tablet (5 mg total) by mouth daily. 90 tablet 0   Finerenone  (KERENDIA ) 10 MG TABS Take 1 tablet (10 mg total) by mouth daily. Please schedule an appointment with Dr. Joshua for further refills. 30 tablet 0   metoprolol  succinate (TOPROL -XL) 25 MG 24 hr tablet TAKE 1 TABLET BY MOUTH EVERY MORNING. HOLD IF SYSTOLIC( TOP NUMBER) LESS THAN 100 OR PULSE LESS THAN 60 90 tablet 0   rosuvastatin  (CRESTOR ) 5 MG tablet Take 1 tablet (5 mg total) by mouth daily. 30 tablet 0   No facility-administered medications prior to visit.    ROS Review of Systems  Constitutional:  Positive for fatigue. Negative for appetite change, chills, diaphoresis and fever.  HENT: Negative.    Respiratory: Negative.  Negative for cough, chest tightness, shortness of breath and wheezing.   Cardiovascular:  Negative for  chest pain, palpitations and leg swelling.  Gastrointestinal:  Positive for constipation. Negative for abdominal pain, blood in stool, diarrhea, nausea and vomiting.  Genitourinary: Negative.   Musculoskeletal: Negative.  Negative for arthralgias and myalgias.  Skin: Negative.   Neurological:  Negative for dizziness and weakness.  Hematological:  Negative for adenopathy. Does not bruise/bleed easily.  Psychiatric/Behavioral: Negative.      Objective:  BP 132/76 (BP Location: Left Arm, Patient  Position: Sitting, Cuff Size: Large)   Pulse 83   Temp 97.7 F (36.5 C) (Oral)   Ht 5' 2 (1.575 m)   Wt (!) 307 lb 3.2 oz (139.3 kg)   SpO2 98%   BMI 56.19 kg/m   BP Readings from Last 3 Encounters:  02/21/24 132/76  11/15/23 (!) 156/85  08/01/23 (!) 152/96    Wt Readings from Last 3 Encounters:  02/21/24 (!) 307 lb 3.2 oz (139.3 kg)  11/15/23 (!) 307 lb (139.3 kg)  08/01/23 (!) 300 lb 6.4 oz (136.3 kg)    Physical Exam Vitals reviewed.  Constitutional:      Appearance: Normal appearance.  HENT:     Nose: Nose normal.     Mouth/Throat:     Mouth: Mucous membranes are moist.  Eyes:     General: No scleral icterus.    Conjunctiva/sclera: Conjunctivae normal.  Cardiovascular:     Rate and Rhythm: Normal rate and regular rhythm.     Heart sounds: No murmur heard.    No friction rub. No gallop.  Pulmonary:     Effort: Pulmonary effort is normal.     Breath sounds: No stridor. No wheezing, rhonchi or rales.  Abdominal:     General: Abdomen is flat.     Palpations: There is no mass.     Tenderness: There is no abdominal tenderness. There is no guarding.     Hernia: No hernia is present.  Musculoskeletal:        General: Normal range of motion.     Cervical back: Neck supple.     Right lower leg: No edema.     Left lower leg: No edema.  Lymphadenopathy:     Cervical: No cervical adenopathy.  Skin:    General: Skin is warm and dry.  Neurological:     General: No focal deficit present.     Mental Status: She is alert.  Psychiatric:        Mood and Affect: Mood normal.        Behavior: Behavior normal.     Lab Results  Component Value Date   WBC 6.1 02/21/2024   HGB 13.6 02/21/2024   HCT 41.6 02/21/2024   PLT 174.0 02/21/2024   GLUCOSE 84 02/21/2024   CHOL 119 08/01/2023   TRIG 49.0 08/01/2023   HDL 64.40 08/01/2023   LDLCALC 45 08/01/2023   ALT 13 02/21/2024   AST 18 02/21/2024   NA 135 02/21/2024   K 4.2 02/21/2024   CL 105 02/21/2024    CREATININE 1.06 02/21/2024   BUN 22 02/21/2024   CO2 25 02/21/2024   TSH 5.24 02/21/2024   HGBA1C 5.4 02/21/2024   MICROALBUR 2.2 (H) 02/21/2024    MM 3D SCREENING MAMMOGRAM BILATERAL BREAST Result Date: 09/07/2023 CLINICAL DATA:  Screening. EXAM: DIGITAL SCREENING BILATERAL MAMMOGRAM WITH TOMOSYNTHESIS AND CAD TECHNIQUE: Bilateral screening digital craniocaudal and mediolateral oblique mammograms were obtained. Bilateral screening digital breast tomosynthesis was performed. The images were evaluated with computer-aided detection. COMPARISON:  Previous exam(s). ACR Breast Density Category  b: There are scattered areas of fibroglandular density. FINDINGS: There are no findings suspicious for malignancy. IMPRESSION: No mammographic evidence of malignancy. A result letter of this screening mammogram will be mailed directly to the patient. RECOMMENDATION: Screening mammogram in one year. (Code:SM-B-01Y) BI-RADS CATEGORY  1: Negative. Electronically Signed   By: Dina  Arceo M.D.   On: 09/07/2023 12:45    Assessment & Plan:   Essential hypertension- BP is well controlled. -     Basic metabolic panel with GFR; Future -     CBC with Differential/Platelet; Future -     Urinalysis, Routine w reflex microscopic; Future  Mild hyperparathyroidism -     PTH, intact and calcium ; Future -     Basic metabolic panel with GFR; Future -     Hepatic function panel; Future  Screen for colon cancer -     Ambulatory referral to Gastroenterology  Cervical cancer screening -     Ambulatory referral to Gynecology  Type II diabetes mellitus with manifestations (HCC)- Blood sugar is very well controlled. -     Hemoglobin A1c; Future -     Microalbumin / creatinine urine ratio; Future -     Urinalysis, Routine w reflex microscopic; Future -     AMB Referral VBCI Care Management  Diabetic polyneuropathy associated with type 2 diabetes mellitus (HCC) -     DULoxetine  HCl; Take 1 capsule (30 mg total) by mouth  daily.  Dispense: 30 capsule; Refill: 0 -     Hemoglobin A1c; Future -     AMB Referral VBCI Care Management  Moderate episode of recurrent major depressive disorder (HCC) -     DULoxetine  HCl; Take 1 capsule (30 mg total) by mouth daily.  Dispense: 30 capsule; Refill: 0 -     TSH; Future -     AMB Referral VBCI Care Management  Gouty arthritis of both feet -     Uric acid; Future  Idiopathic chronic gout of multiple sites without tophus -     Allopurinol ; Take 1 tablet (100 mg total) by mouth daily.  Dispense: 90 tablet; Refill: 0     Follow-up: Return in about 6 months (around 08/21/2024).  Debby Molt, MD

## 2024-02-21 NOTE — Patient Instructions (Signed)

## 2024-02-22 ENCOUNTER — Ambulatory Visit: Payer: Self-pay | Admitting: Internal Medicine

## 2024-02-22 LAB — URINALYSIS, ROUTINE W REFLEX MICROSCOPIC
Bilirubin Urine: NEGATIVE
Hgb urine dipstick: NEGATIVE
Ketones, ur: NEGATIVE
Leukocytes,Ua: NEGATIVE
Nitrite: NEGATIVE
Specific Gravity, Urine: 1.025 (ref 1.000–1.030)
Total Protein, Urine: NEGATIVE
Urine Glucose: NEGATIVE
Urobilinogen, UA: 0.2 (ref 0.0–1.0)
pH: 5.5 (ref 5.0–8.0)

## 2024-02-22 MED ORDER — ALLOPURINOL 100 MG PO TABS: 100.0000 mg | ORAL_TABLET | Freq: Every day | ORAL | 0 refills | Status: AC

## 2024-02-23 LAB — PTH, INTACT AND CALCIUM
Calcium: 9.2 mg/dL (ref 8.6–10.4)
PTH: 44 pg/mL (ref 16–77)

## 2024-02-25 ENCOUNTER — Telehealth: Payer: Self-pay | Admitting: *Deleted

## 2024-02-25 NOTE — Progress Notes (Unsigned)
 Complex Care Management Note Care Guide Note  02/25/2024 Name: Jamie Salazar MRN: [id number] DOB: 23-Feb-1961   Complex Care Management Outreach Attempts: An unsuccessful telephone outreach was attempted today to offer the patient information about available complex care management services.  Follow Up Plan:  Additional outreach attempts will be made to offer the patient complex care management information and services.   Encounter Outcome:  No Answer  Thedford Franks, CMA [place]  [facility], [facility] Guide Direct Dial: [phone]  Fax: [phone] Website: [email or web address]

## 2024-02-26 NOTE — Progress Notes (Unsigned)
 Complex Care Management Note Care Guide Note  02/26/2024 Name: Jamie Salazar MRN: [id number] DOB: 12/18/1960   Complex Care Management Outreach Attempts: A second unsuccessful outreach was attempted today to offer the patient with information about available complex care management services.  Follow Up Plan:  Additional outreach attempts will be made to offer the patient complex care management information and services.   Encounter Outcome:  No Answer  Thedford Franks, CMA [place]  [facility], [facility] Guide Direct Dial: [phone]  Fax: [phone] Website: [email or web address]

## 2024-02-27 ENCOUNTER — Encounter (INDEPENDENT_AMBULATORY_CARE_PROVIDER_SITE_OTHER): Admitting: Ophthalmology

## 2024-02-27 DIAGNOSIS — H43813 Vitreous degeneration, bilateral: Secondary | ICD-10-CM

## 2024-02-27 DIAGNOSIS — I10 Essential (primary) hypertension: Secondary | ICD-10-CM

## 2024-02-27 DIAGNOSIS — H33302 Unspecified retinal break, left eye: Secondary | ICD-10-CM | POA: Diagnosis not present

## 2024-02-27 DIAGNOSIS — H35033 Hypertensive retinopathy, bilateral: Secondary | ICD-10-CM

## 2024-02-27 NOTE — Progress Notes (Signed)
 Complex Care Management Note Care Guide Note  02/27/2024 Name: Jamie Salazar MRN: [id number] DOB: Jul 25, 1960   Complex Care Management Outreach Attempts: A third unsuccessful outreach was attempted today to offer the patient with information about available complex care management services.  Follow Up Plan:  No further outreach attempts will be made at this time. We have been unable to contact the patient to offer or enroll patient in complex care management services.  Encounter Outcome:  No Answer  Thedford Franks, CMA [place]  [facility], [facility] Guide Direct Dial: [phone]  Fax: [phone] Website: [email or web address]

## 2024-02-28 ENCOUNTER — Telehealth: Payer: Self-pay

## 2024-02-28 NOTE — Telephone Encounter (Signed)
 Copied from CRM [id number]. Topic: Clinical - Prescription Issue >> Feb 28, 2024 12:37 PM Victoria A wrote: Reason for CRM: Patient calling to check status of rosuvastatin  (CRESTOR ) 5 MG tablet; Agent informed patient it was sent on 02/22/24 to pharmacy but [facility] said request has not been received. Please contact patient

## 2024-02-29 ENCOUNTER — Other Ambulatory Visit: Payer: Self-pay

## 2024-02-29 DIAGNOSIS — E785 Hyperlipidemia, unspecified: Secondary | ICD-10-CM

## 2024-02-29 MED ORDER — ROSUVASTATIN CALCIUM 5 MG PO TABS: 5.0000 mg | ORAL_TABLET | Freq: Every day | ORAL | 1 refills | Status: AC

## 2024-02-29 NOTE — Telephone Encounter (Signed)
 Medication has been resent and patient has been made aware.

## 2024-03-10 ENCOUNTER — Encounter: Payer: Self-pay | Admitting: *Deleted

## 2024-03-10 NOTE — Progress Notes (Signed)
 Jamie Salazar                                          MRN: [id number]   03/10/2024   The [facility] Quality Team Specialist reviewed this patient medical record for the purposes of chart review for care gap closure. The following were reviewed: abstraction for care gap closure-glycemic status assessment and kidney health evaluation for diabetes:eGFR  and uACR.    [facility] Quality Team

## 2024-03-16 ENCOUNTER — Other Ambulatory Visit: Payer: Self-pay | Admitting: Internal Medicine

## 2024-03-16 DIAGNOSIS — E1121 Type 2 diabetes mellitus with diabetic nephropathy: Secondary | ICD-10-CM

## 2024-03-16 DIAGNOSIS — E118 Type 2 diabetes mellitus with unspecified complications: Secondary | ICD-10-CM

## 2024-03-28 ENCOUNTER — Ambulatory Visit

## 2024-04-21 ENCOUNTER — Encounter: Payer: Self-pay | Admitting: *Deleted

## 2024-04-21 ENCOUNTER — Other Ambulatory Visit: Payer: Self-pay | Admitting: Internal Medicine

## 2024-04-21 DIAGNOSIS — E1121 Type 2 diabetes mellitus with diabetic nephropathy: Secondary | ICD-10-CM

## 2024-04-21 DIAGNOSIS — E118 Type 2 diabetes mellitus with unspecified complications: Secondary | ICD-10-CM

## 2024-04-21 NOTE — Progress Notes (Signed)
 Jamie Salazar                                          MRN: [id number]   04/21/2024   The [facility] Quality Team Specialist reviewed this patient medical record for the purposes of chart review for care gap closure. The following were reviewed: chart review for care gap closure-glycemic status assessment and kidney health evaluation for diabetes:eGFR  and uACR.    [facility] Quality Team

## 2024-04-25 NOTE — Telephone Encounter (Signed)
 Copied from CRM #[id number]. Topic: Clinical - Prescription Issue >> Apr 23, 2024  4:43 PM Rea ORN wrote: Reason for CRM: Pt called to get an update on when  KERENDIA  10 MG TABS will be called in. Pt stated she requested it on Monday 04/21/24.  Please call back to advise, [phone]

## 2024-05-06 ENCOUNTER — Telehealth: Payer: Self-pay

## 2024-05-06 NOTE — Telephone Encounter (Signed)
 Copied from CRM #[id number]. Topic: General - Call Back - No Documentation >> May 06, 2024  3:09 PM Charolett L wrote: Reason for CRM: Patient called in to return office call. Patient is requesting a call back

## 2024-05-14 NOTE — Telephone Encounter (Signed)
 Discussed medication with patient and answered all questions.

## 2024-05-15 ENCOUNTER — Ambulatory Visit: Admitting: Dermatology

## 2024-05-15 ENCOUNTER — Encounter: Payer: Self-pay | Admitting: Dermatology

## 2024-05-15 VITALS — BP 141/89

## 2024-05-15 DIAGNOSIS — L219 Seborrheic dermatitis, unspecified: Secondary | ICD-10-CM

## 2024-05-15 MED ORDER — CLOBETASOL PROPIONATE 0.05 % EX SOLN: 1.0000 | Freq: Two times a day (BID) | CUTANEOUS | 5 refills | Status: AC

## 2024-05-15 MED ORDER — FLUOCINOLONE ACETONIDE SCALP 0.01 % EX OIL: 1.0000 "application " | TOPICAL_OIL | CUTANEOUS | 6 refills | Status: AC

## 2024-05-15 NOTE — Progress Notes (Signed)
" ° °  New Patient Visit   Subjective  Jamie Salazar is a 64 y.o. female who presents for the following: Itchiness and flaking of scalp since last year. Her scalp is very tender and sometimes burns when she washes. She shampoos about once weekly.    The following portions of the chart were reviewed this encounter and updated as appropriate: medications, allergies, medical history  Review of Systems:  No other skin or systemic complaints except as noted in HPI or Assessment and Plan.  Objective  Well appearing patient in no apparent distress; mood and affect are within normal limits.   A focused examination was performed of the following areas: Scalp    Relevant exam findings are noted in the Assessment and Plan.           Assessment & Plan   SEBORRHEIC DERMATITIS Exam: Pink patches with greasy scale at scalp   Patient Education Discussed During Visit: Seborrheic Dermatitis is a chronic persistent rash characterized by pinkness and scaling most commonly of the mid face but also can occur on the scalp (dandruff), ears; mid chest, mid back and groin.  It tends to be exacerbated by stress and cooler weather.  People who have neurologic disease may experience new onset or exacerbation of existing seborrheic dermatitis.  The condition is not curable but treatable and can be controlled.   Pt presents w/ Chronic seborrheic dermatitis with itchy, flaky scalp, exacerbated by weather changes. No erythema, but thick greasy flakes present. Condition due to overgrowth of yeast on skin and scalp, causing inflammation, stinging, burning, and itching. Requires ongoing management as it can recur.  - Recommended DHS Zinc shampoo, a zinc-based shampoo with 2% zinc, to calm inflammation and prevent yeast overgrowth. Use once a week.  - Prescribed fluocinolone  oil to be applied to the scalp on Fridays, left for two hours, and then washed out at the [facility]. - Prescribed clobetasol  liquid drops  for flare-ups, to be applied to itchy or burning spots as needed. - Advised that it may take about a month of using the shampoo and oil for symptoms to be under control, with decreasing use of drops over time. - Ensured prescriptions are sent to the pharmacy with refills for ongoing management.       Return in about 1 year (around 05/15/2025) for [facility] follow up.  I, Roseline Hutchinson, CMA, am acting as scribe for [employer], DO .   Documentation: I have reviewed the above documentation for accuracy and completeness, and I agree with the above.  Delon Lenis, DO    "

## 2024-05-15 NOTE — Patient Instructions (Addendum)
 VISIT SUMMARY:  Today, you were seen for an itchy, flaky scalp that has been bothering you throughout the year, with worsening symptoms during weather changes. You also experience burning and tenderness in your scalp, ears, and nose. We discussed a treatment plan to help manage your symptoms.  YOUR PLAN:  -SEBORRHEIC DERMATITIS:  Seborrheic dermatitis is a chronic skin condition caused by an overgrowth of yeast on the skin and scalp, leading to inflammation, stinging, burning, and itching. To manage this condition, you are advised to use DHS Zinc shampoo once a week to calm inflammation and prevent yeast overgrowth.   Additionally, you are prescribed fluocinolone  oil to be applied to your scalp on Fridays, left for two hours, and then washed out at the [facility].   For flare-ups, you are prescribed clobetasol  liquid drops to be applied to itchy or burning spots as needed. It may take about a month of using the shampoo and oil for your symptoms to be under control, with decreasing use of the drops over time.   Your prescriptions have been sent to the pharmacy with refills for ongoing management.  INSTRUCTIONS:  Please follow the treatment plan as discussed. Use the DHS Zinc shampoo once a week, apply the fluocinolone  oil to your scalp on Fridays and wash it out after two hours, and use the clobetasol  liquid drops for flare-ups as needed. It may take about a month for your symptoms to improve. Ensure you pick up your prescriptions from the pharmacy and continue with the treatment for ongoing management.       Important Information  Due to recent changes in healthcare laws, you may see results of your pathology and/or laboratory studies on MyChart before the doctors have had a chance to review them. We understand that in some cases there may be results that are confusing or concerning to you. Please understand that not all results are received at the same time and often the doctors may  need to interpret multiple results in order to provide you with the best plan of care or course of treatment. Therefore, we ask that you please give us  2 business days to thoroughly review all your results before contacting the office for clarification. Should we see a critical lab result, you will be contacted sooner.   If You Need Anything After Your Visit  If you have any questions or concerns for your doctor, please call our main line at [phone] If no one answers, please leave a voicemail as directed and we will return your call as soon as possible. Messages left after 4 pm will be answered the following business day.   You may also send us  a message via MyChart. We typically respond to MyChart messages within 1-2 business days.  For prescription refills, please ask your pharmacy to contact our office. Our fax number is [phone].  If you have an urgent issue when the clinic is closed that cannot wait until the next business day, you can page your doctor at the number below.    Please note that while we do our best to be available for urgent issues outside of office hours, we are not available 24/7.   If you have an urgent issue and are unable to reach us , you may choose to seek medical care at your doctor's office, retail clinic, urgent care center, or emergency room.  If you have a medical emergency, please immediately call 911 or go to the emergency department. In the event of inclement weather,  please call our main line at [phone] for an update on the status of any delays or closures.  Dermatology Medication Tips: Please keep the boxes that topical medications come in in order to help keep track of the instructions about where and how to use these. Pharmacies typically print the medication instructions only on the boxes and not directly on the medication tubes.   If your medication is too expensive, please contact our office at [phone] or send us  a message through  MyChart.   We are unable to tell what your co-pay for medications will be in advance as this is different depending on your insurance coverage. However, we may be able to find a substitute medication at lower cost or fill out paperwork to get insurance to cover a needed medication.   If a prior authorization is required to get your medication covered by your insurance company, please allow us  1-2 business days to complete this process.  Drug prices often vary depending on where the prescription is filled and some pharmacies may offer cheaper prices.  The website www.goodrx.com contains coupons for medications through different pharmacies. The prices here do not account for what the cost may be with help from insurance (it may be cheaper with your insurance), but the website can give you the price if you did not use any insurance.  - You can print the associated coupon and take it with your prescription to the pharmacy.  - You may also stop by our office during regular business hours and pick up a GoodRx coupon card.  - If you need your prescription sent electronically to a different pharmacy, notify our office through [facility] or by phone at [phone]

## 2024-05-26 ENCOUNTER — Other Ambulatory Visit: Payer: Self-pay | Admitting: Internal Medicine

## 2024-05-26 DIAGNOSIS — R000 Tachycardia, unspecified: Secondary | ICD-10-CM

## 2024-06-26 ENCOUNTER — Encounter (INDEPENDENT_AMBULATORY_CARE_PROVIDER_SITE_OTHER): Admitting: Ophthalmology

## 2024-08-21 ENCOUNTER — Ambulatory Visit: Admitting: Internal Medicine

## 2024-08-25 ENCOUNTER — Ambulatory Visit: Admitting: Internal Medicine
